# Patient Record
Sex: Female | Born: 1955 | Hispanic: No | Marital: Married | Smoking: Never smoker
Health system: Southern US, Community
[De-identification: ages and names within clinical notes are randomized; demographics above are authoritative.]

## PROBLEM LIST (undated history)

## (undated) DIAGNOSIS — R509 Fever, unspecified: Secondary | ICD-10-CM

## (undated) DIAGNOSIS — I5032 Chronic diastolic (congestive) heart failure: Secondary | ICD-10-CM

## (undated) DIAGNOSIS — I10 Essential (primary) hypertension: Secondary | ICD-10-CM

## (undated) DIAGNOSIS — M2550 Pain in unspecified joint: Secondary | ICD-10-CM

## (undated) DIAGNOSIS — N3001 Acute cystitis with hematuria: Secondary | ICD-10-CM

## (undated) DIAGNOSIS — R55 Syncope and collapse: Secondary | ICD-10-CM

## (undated) DIAGNOSIS — I319 Disease of pericardium, unspecified: Secondary | ICD-10-CM

## (undated) DIAGNOSIS — K625 Hemorrhage of anus and rectum: Secondary | ICD-10-CM

## (undated) DIAGNOSIS — R002 Palpitations: Secondary | ICD-10-CM

## (undated) DIAGNOSIS — F418 Other specified anxiety disorders: Secondary | ICD-10-CM

## (undated) DIAGNOSIS — I471 Supraventricular tachycardia: Secondary | ICD-10-CM

## (undated) DIAGNOSIS — R319 Hematuria, unspecified: Secondary | ICD-10-CM

## (undated) DIAGNOSIS — Z7982 Long term (current) use of aspirin: Secondary | ICD-10-CM

## (undated) HISTORY — PX: OTHER SURGICAL HISTORY: SHX169

## (undated) HISTORY — PX: ABDOMINAL HYSTERECTOMY: SHX81

---
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
## 1999-03-03 ENCOUNTER — Other Ambulatory Visit: Admission: RE | Admit: 1999-03-03 | Discharge: 1999-03-03 | Payer: Self-pay | Admitting: Obstetrics and Gynecology

## 1999-04-06 ENCOUNTER — Other Ambulatory Visit: Admission: RE | Admit: 1999-04-06 | Discharge: 1999-04-06 | Payer: Self-pay | Admitting: Obstetrics and Gynecology

## 1999-10-04 ENCOUNTER — Other Ambulatory Visit: Admission: RE | Admit: 1999-10-04 | Discharge: 1999-10-04 | Payer: Self-pay | Admitting: Obstetrics and Gynecology

## 2000-01-04 ENCOUNTER — Other Ambulatory Visit: Admission: RE | Admit: 2000-01-04 | Discharge: 2000-01-04 | Payer: Self-pay | Admitting: Obstetrics and Gynecology

## 2000-01-04 ENCOUNTER — Other Ambulatory Visit: Admission: RE | Admit: 2000-01-04 | Discharge: 2000-01-04 | Payer: Self-pay | Admitting: Otolaryngology

## 2018-07-20 ENCOUNTER — Other Ambulatory Visit: Payer: Self-pay

## 2018-07-20 ENCOUNTER — Encounter (HOSPITAL_COMMUNITY): Payer: Self-pay

## 2018-07-20 ENCOUNTER — Emergency Department (HOSPITAL_COMMUNITY): Payer: BLUE CROSS/BLUE SHIELD

## 2018-07-20 ENCOUNTER — Observation Stay (HOSPITAL_BASED_OUTPATIENT_CLINIC_OR_DEPARTMENT_OTHER)
Admission: EM | Admit: 2018-07-20 | Discharge: 2018-07-21 | Disposition: A | Discharge status: Home / Self Care | Payer: BLUE CROSS/BLUE SHIELD | Source: Home / Self Care | Attending: Emergency Medicine | Admitting: Emergency Medicine

## 2018-07-20 DIAGNOSIS — R55 Syncope and collapse: Secondary | ICD-10-CM | POA: Diagnosis present

## 2018-07-20 DIAGNOSIS — J9811 Atelectasis: Secondary | ICD-10-CM | POA: Insufficient documentation

## 2018-07-20 DIAGNOSIS — M47814 Spondylosis without myelopathy or radiculopathy, thoracic region: Secondary | ICD-10-CM | POA: Insufficient documentation

## 2018-07-20 DIAGNOSIS — I10 Essential (primary) hypertension: Secondary | ICD-10-CM | POA: Insufficient documentation

## 2018-07-20 DIAGNOSIS — N2889 Other specified disorders of kidney and ureter: Secondary | ICD-10-CM

## 2018-07-20 DIAGNOSIS — I319 Disease of pericardium, unspecified: Secondary | ICD-10-CM | POA: Diagnosis present

## 2018-07-20 DIAGNOSIS — R0789 Other chest pain: Secondary | ICD-10-CM | POA: Diagnosis not present

## 2018-07-20 DIAGNOSIS — R071 Chest pain on breathing: Secondary | ICD-10-CM | POA: Diagnosis not present

## 2018-07-20 DIAGNOSIS — R079 Chest pain, unspecified: Secondary | ICD-10-CM | POA: Insufficient documentation

## 2018-07-20 DIAGNOSIS — M5136 Other intervertebral disc degeneration, lumbar region: Secondary | ICD-10-CM

## 2018-07-20 DIAGNOSIS — M549 Dorsalgia, unspecified: Secondary | ICD-10-CM | POA: Insufficient documentation

## 2018-07-20 DIAGNOSIS — I313 Pericardial effusion (noninflammatory): Secondary | ICD-10-CM

## 2018-07-20 DIAGNOSIS — I300 Acute nonspecific idiopathic pericarditis: Secondary | ICD-10-CM | POA: Diagnosis not present

## 2018-07-20 DIAGNOSIS — E669 Obesity, unspecified: Secondary | ICD-10-CM

## 2018-07-20 DIAGNOSIS — I700 Atherosclerosis of aorta: Secondary | ICD-10-CM | POA: Insufficient documentation

## 2018-07-20 DIAGNOSIS — Z8249 Family history of ischemic heart disease and other diseases of the circulatory system: Secondary | ICD-10-CM | POA: Insufficient documentation

## 2018-07-20 HISTORY — DX: Syncope and collapse: R55

## 2018-07-20 HISTORY — DX: Essential (primary) hypertension: I10

## 2018-07-20 HISTORY — DX: Disease of pericardium, unspecified: I31.9

## 2018-07-20 LAB — URINALYSIS, ROUTINE W REFLEX MICROSCOPIC
Bilirubin Urine: NEGATIVE
Glucose, UA: NEGATIVE mg/dL
Hgb urine dipstick: NEGATIVE
Ketones, ur: NEGATIVE mg/dL
Nitrite: NEGATIVE
Protein, ur: NEGATIVE mg/dL
Specific Gravity, Urine: 1.009 (ref 1.005–1.030)
pH: 6 (ref 5.0–8.0)

## 2018-07-20 LAB — CBC WITH DIFFERENTIAL/PLATELET
Abs Immature Granulocytes: 0.07 10*3/uL (ref 0.00–0.07)
Basophils Absolute: 0 10*3/uL (ref 0.0–0.1)
Basophils Relative: 0 %
Eosinophils Absolute: 0 10*3/uL (ref 0.0–0.5)
Eosinophils Relative: 0 %
HCT: 43.3 % (ref 36.0–46.0)
Hemoglobin: 13.5 g/dL (ref 12.0–15.0)
Immature Granulocytes: 1 %
Lymphocytes Relative: 7 %
Lymphs Abs: 1 10*3/uL (ref 0.7–4.0)
MCH: 30.7 pg (ref 26.0–34.0)
MCHC: 31.2 g/dL (ref 30.0–36.0)
MCV: 98.4 fL (ref 80.0–100.0)
Monocytes Absolute: 1.8 10*3/uL — ABNORMAL HIGH (ref 0.1–1.0)
Monocytes Relative: 13 %
Neutro Abs: 11.1 10*3/uL — ABNORMAL HIGH (ref 1.7–7.7)
Neutrophils Relative %: 79 %
Platelets: 172 10*3/uL (ref 150–400)
RBC: 4.4 MIL/uL (ref 3.87–5.11)
RDW: 11.8 % (ref 11.5–15.5)
WBC: 14 10*3/uL — ABNORMAL HIGH (ref 4.0–10.5)
nRBC: 0 % (ref 0.0–0.2)

## 2018-07-20 LAB — COMPREHENSIVE METABOLIC PANEL
ALT: 16 U/L (ref 0–44)
AST: 18 U/L (ref 15–41)
Albumin: 3.4 g/dL — ABNORMAL LOW (ref 3.5–5.0)
Alkaline Phosphatase: 64 U/L (ref 38–126)
Anion gap: 10 (ref 5–15)
BUN: 12 mg/dL (ref 8–23)
CO2: 25 mmol/L (ref 22–32)
Calcium: 8.8 mg/dL — ABNORMAL LOW (ref 8.9–10.3)
Chloride: 101 mmol/L (ref 98–111)
Creatinine, Ser: 0.78 mg/dL (ref 0.44–1.00)
GFR calc Af Amer: >60 mL/min (ref >60)
GFR calc non Af Amer: >60 mL/min (ref >60)
Glucose, Bld: 128 mg/dL — ABNORMAL HIGH (ref 70–99)
Potassium: 3.5 mmol/L (ref 3.5–5.1)
Sodium: 136 mmol/L (ref 135–145)
Total Bilirubin: 1.2 mg/dL (ref 0.3–1.2)
Total Protein: 6.5 g/dL (ref 6.5–8.1)

## 2018-07-20 LAB — I-STAT TROPONIN, ED
Troponin i, poc: 0 ng/mL (ref 0.00–0.08)
Troponin i, poc: 0.01 ng/mL (ref 0.00–0.08)

## 2018-07-20 LAB — TROPONIN I: Troponin I: <0.03 ng/mL (ref <0.03)

## 2018-07-20 MED ORDER — IOPAMIDOL (ISOVUE-370) INJECTION 76%
100.0000 mL | Freq: Once | INTRAVENOUS | Status: AC | PRN
  Administered 2018-07-20: 100 mL via INTRAVENOUS

## 2018-07-20 MED ORDER — SODIUM CHLORIDE 0.9 % IV BOLUS
500.0000 mL | Freq: Once | INTRAVENOUS | Status: AC
  Administered 2018-07-20: 500 mL via INTRAVENOUS

## 2018-07-20 MED ORDER — ONDANSETRON HCL 4 MG/2ML IJ SOLN: 4.0000 mg | Freq: Four times a day (QID) | INTRAMUSCULAR | Status: DC | PRN

## 2018-07-20 MED ORDER — SODIUM CHLORIDE 0.9 % IV SOLN
INTRAVENOUS | Status: DC
  Administered 2018-07-20 – 2018-07-21 (×2): via INTRAVENOUS

## 2018-07-20 MED ORDER — SODIUM CHLORIDE 0.9 % IV BOLUS
1000.0000 mL | Freq: Once | INTRAVENOUS | Status: AC
  Administered 2018-07-20: 1000 mL via INTRAVENOUS

## 2018-07-20 MED ORDER — ACETAMINOPHEN 325 MG PO TABS
650.0000 mg | ORAL_TABLET | ORAL | Status: DC | PRN
  Administered 2018-07-21 (×2): 650 mg via ORAL
  Filled 2018-07-20 (×2): qty 2

## 2018-07-20 MED ORDER — ENOXAPARIN SODIUM 40 MG/0.4ML ~~LOC~~ SOLN: 40.0000 mg | SUBCUTANEOUS | Status: DC

## 2018-07-20 MED ORDER — IOPAMIDOL (ISOVUE-370) INJECTION 76%
INTRAVENOUS | Status: AC
  Filled 2018-07-20: qty 100

## 2018-07-20 MED ORDER — MORPHINE SULFATE (PF) 4 MG/ML IV SOLN
4.0000 mg | Freq: Once | INTRAVENOUS | Status: AC
  Administered 2018-07-20: 4 mg via INTRAVENOUS
  Filled 2018-07-20: qty 1

## 2018-07-20 MED ORDER — FENTANYL CITRATE (PF) 100 MCG/2ML IJ SOLN
25.0000 ug | INTRAMUSCULAR | Status: DC | PRN
  Administered 2018-07-20: 25 ug via INTRAVENOUS
  Filled 2018-07-20: qty 2

## 2018-07-20 NOTE — Consult Note (Signed)
Cardiology Consultation:   Patient ID: Erica Oneill Bise MRN: [id number]; DOB: 11/20/1955  Admit date: 07/20/2018 Date of Consult: 07/20/2018  Primary Care Provider: Hadley Penobbins, Robert A, MD Primary Cardiologist: None  Patient Profile:   Erica Oneill Gavin is a 63 y.o. female with a hx of HTN who is being seen today for the evaluation of chest discomfort and syncope at the request of [address]  History of Present Illness:   Erica Oneill Diodato is a 63 y.o. female with a hx of HTN who is being seen today for the evaluation of chest discomfort and syncope.  The patient has no cardiac history. She reports that she has been caring for her mother who recently had CABG and thinks that she pulled a muscle in her back. She has subsequently experienced pain that has been persistent, and last night she developed radiation of this pain to her chest. The chest pain was sharp and persistent without aggravating or alleviating factors.   This morning she was sitting at her table and suddenly became lightheaded and diaphoretic very briefly lost consciousness. She had no new chest pain, palpitations, dyspnea or other symptoms before or after this episode. She checked her BP which was 80s/40s. She notes that she has not been eating and drinking enough recently and felt dehydrated at the time.   She presented to the ED this evening. In the ED, SBP initially 110s with HR 80s. ECG showed  NSR with nonspecific ST changes. CTA chest/abdomen/pelvis showed no acute processes. Troponin negative x2. Later in the ED she had an episode of dizziness at which time she became bradycardic and hypotensive.    She was admitted to the hospitalist service and given IVF. Cardiology was consulted for further recommendations.   Past Medical History:  Diagnosis Date  . Hypertension     Past Surgical History:  Procedure Laterality Date  . ABDOMINAL HYSTERECTOMY    . TORN MENISCUS IN LEFT KNEE Left      Home Medications:    Prior to Admission medications   Not on File    Inpatient Medications: Scheduled Meds: . [START ON 07/21/2018] enoxaparin (LOVENOX) injection  40 mg Subcutaneous Q24H   Continuous Infusions: . sodium chloride 999 mL/hr at 07/20/18 2120   PRN Meds: acetaminophen, fentaNYL (SUBLIMAZE) injection, ondansetron (ZOFRAN) IV  Allergies:    Allergies  Allergen Reactions  . Lidocaine Anaphylaxis  . Rocephin [Ceftriaxone] Anaphylaxis  . Other Rash and Hypertension    seafood    Social History:   Social History   Socioeconomic History  . Marital status: Unknown    Spouse name: Not on file  . Number of children: Not on file  . Years of education: Not on file  . Highest education level: Not on file  Occupational History  . Not on file  Social Needs  . Financial resource strain: Not on file  . Food insecurity:    Worry: Not on file    Inability: Not on file  . Transportation needs:    Medical: Not on file    Non-medical: Not on file  Tobacco Use  . Smoking status: Never Smoker  . Smokeless tobacco: Never Used  Substance and Sexual Activity  . Alcohol use: Not Currently  . Drug use: Never  . Sexual activity: Not on file  Lifestyle  . Physical activity:    Days per week: Not on file    Minutes per session: Not on file  . Stress: Not on file  Relationships  .  Social connections:    Talks on phone: Not on file    Gets together: Not on file    Attends religious service: Not on file    Active member of club or organization: Not on file    Attends meetings of clubs or organizations: Not on file    Relationship status: Not on file  . Intimate partner violence:    Fear of current or ex partner: Not on file    Emotionally abused: Not on file    Physically abused: Not on file    Forced sexual activity: Not on file  Other Topics Concern  . Not on file  Social History Narrative  . Not on file    Family History:    Family History  Problem Relation Age of Onset  .  Hypertension Mother   . Heart attack Mother 55  . Cancer Father   . Heart attack Maternal Grandfather 49     ROS:  Please see the history of present illness.  All other ROS reviewed and negative.     Physical Exam/Data:   Vitals:   07/20/18 2015 07/20/18 2030 07/20/18 2045 07/20/18 2152  BP: 100/62 (!) 98/54 (!) 96/58 107/62  Pulse: 87 85 83 81  Resp: (!) 21 17 16 18   Temp:    98.6 F (37 C)  TempSrc:    Oral  SpO2: 95% 93% 95% 94%  Weight:      Height:        Intake/Output Summary (Last 24 hours) at 07/20/2018 2224 Last data filed at 07/20/2018 2010 Gross per 24 hour  Intake 1000 ml  Output -  Net 1000 ml   Last 3 Weights 07/20/2018  Weight (lbs) 149 lb  Weight (kg) 67.586 kg     Body mass index is 24.79 kg/m.  General:  Well nourished, well developed, in no acute distress  HEENT: normal Lymph: no adenopathy Neck: no JVD Cardiac:  normal S1, S2; RRR; no murmur  Lungs:  clear to auscultation bilaterally, no wheezing, rhonchi or rales  Abd: soft, nontender, no hepatomegaly  Ext: no edema Musculoskeletal:  No deformities, BUE and BLE strength normal and equal Skin: warm and dry  Neuro:  No focal abnormalities noted Psych:  Normal affect   EKG:  The EKG was personally reviewed and demonstrates:  NSR with nonspecific ST changes Telemetry:  Telemetry was personally reviewed and demonstrates:  No events   Relevant CV Studies Reports very remote nuclear stress test at [facility] that was normal   Laboratory Data:  Chemistry Recent Labs  Lab 07/20/18 1356  NA 136  K 3.5  CL 101  CO2 25  GLUCOSE 128*  BUN 12  CREATININE 0.78  CALCIUM 8.8*  GFRNONAA >60  GFRAA >60  ANIONGAP 10    Recent Labs  Lab 07/20/18 1356  PROT 6.5  ALBUMIN 3.4*  AST 18  ALT 16  ALKPHOS 64  BILITOT 1.2   Hematology Recent Labs  Lab 07/20/18 1356  WBC 14.0*  RBC 4.40  HGB 13.5  HCT 43.3  MCV 98.4  MCH 30.7  MCHC 31.2  RDW 11.8  PLT 172   Cardiac EnzymesNo results  for input(s): TROPONINI in the last 168 hours.  Recent Labs  Lab 07/20/18 1409 07/20/18 1830  TROPIPOC 0.00 0.01    BNPNo results for input(s): BNP, PROBNP in the last 168 hours.  DDimer No results for input(s): DDIMER in the last 168 hours.  Radiology/Studies:  Ct Angio Chest/abd/pel For Dissection  W And/or Wo Contrast  Result Date: 07/20/2018 CLINICAL DATA:  Upper back pain extending into the chest EXAM: CT ANGIOGRAPHY CHEST, ABDOMEN AND PELVIS TECHNIQUE: Multidetector CT imaging through the chest, abdomen and pelvis was performed using the standard protocol during bolus administration of intravenous contrast. Multiplanar reconstructed images and MIPs were obtained and reviewed to evaluate the vascular anatomy. CONTRAST:  ISOVUE-370 IOPAMIDOL (ISOVUE-370) INJECTION 76% COMPARISON:  Chest radiograph 11/02/2017 FINDINGS: CTA CHEST FINDINGS Cardiovascular: On the noncontrast images there is mild atherosclerotic calcification of the thoracic aorta but no findings of acute intramural hematoma. Following contrast administration we demonstrate no evidence of aortic or branch vessel dissection. Mild cardiomegaly is present and there is a small pericardial effusion. Today's exam was not optimized specifically to assess for pulmonary embolus, but we do not demonstrate any large filling defect in the central pulmonary arteries. Mediastinum/Nodes: Unremarkable Lungs/Pleura: There is dependent atelectasis in both lower lobes. Musculoskeletal: Thoracic spondylosis. Review of the MIP images confirms the above findings. CTA ABDOMEN AND PELVIS FINDINGS VASCULAR Aorta: No significant atherosclerotic calcification of the abdominal aorta. No aneurysm or dissection. Celiac: Patent celiac trunk giving rise to the common hepatic and ultimately the proper hepatic artery. No splenic artery aneurysm. SMA: Patent and unremarkable. Renals: 2 left and single right renal arteries appear patent and unremarkable. IMA: Patent  and unremarkable. Inflow: Unremarkable Veins: Incidental retroaortic left renal vein. Review of the MIP images confirms the above findings. NON-VASCULAR Hepatobiliary: Arterial phase appearance of the liver is unremarkable. The gallbladder is mildly prominent but without peritubal wall thickening. Pancreas: Unremarkable Spleen: Typical striated arterial phase enhancement pattern without specific splenic lesion identified. Adrenals/Urinary Tract: 10 mm hypodense lesion of the right kidney upper pole is likely a cyst but technically nonspecific due to small size the adrenal glands appear normal. Urinary bladder normal. Stomach/Bowel: Unremarkable Lymphatic: No lymphadenopathy identified. Reproductive: Uterus absent.  Adnexa unremarkable. Other: No supplemental non-categorized findings. Musculoskeletal: Mild grade 1 degenerative retrolisthesis at L3-4. Degenerative facet arthropathy at L5-S1. Review of the MIP images confirms the above findings. IMPRESSION: 1. No acute vascular findings. There is only minimal atherosclerotic calcification of the aortic arch. No dissection observed. 2. Mild cardiomegaly.  Trace pericardial effusion. 3. Mild dependent atelectasis in both lower lobes. 4. 10 mm hypodense lesion of the right kidney upper pole is likely a cyst but technically nonspecific. Electronically Signed   By: Gaylyn Rong M.D.   On: 07/20/2018 17:17    Assessment and Plan:   Chest pain The patient has no know cardiac history. She has experienced persistent back pain due to presumed muscle strain. This pain subsequently radiated to her chest. ECG shows no acute changes and troponin is negative x2. Her symptoms are not consistent with angina.  -Continue to trend troponin and monitor symptoms -Recommend orthostatic vital signs  -Continue IVF -Can consider stress testing, likely as outpatient   Syncopal episode The patient experienced a first time syncopal episode at home today preceded by dizziness and  diaphoresis but no other associated cardiac symptoms; this occurred in the setting of decreased oral intake. She then had a similar episode in the ED tonight and was noted to be hypotensive and bradycardic at the time. Her presentation suggests neurocardiogenic etiology, likely with element of hypovolemia. She feels symptomatically improved with IVF. ECG shows no signs of conduction disease. Arrhythmogenic syncope is therefore unlikely.  -Continue telemetry -Continue to monitor symptoms -Baseline echocardiogram reasonable      For questions or updates, please contact [facility]  Please consult [email or web address] for contact info under     Signed, Ernest Mallickaylor Kishan Wachsmuth, MD  07/20/2018 10:24 PM

## 2018-07-20 NOTE — H&P (Signed)
History and Physical    Erica Oneill DOB: 1956/04/08 DOA: 07/20/2018  PCP: Hadley Pen, MD  Patient coming from: Home  I have personally briefly reviewed patient's old medical records in [facility]  Chief Complaint: CP  HPI: Erica Oneill is a 63 y.o. female with medical history significant of HTN, obesity.  Patient has been on diet pills recently and lost significant weight.  Patient presents to ED with c/o CP and back pain.  Onset 730pm yesterday.  This morning PTA had episode of syncope.  BP at that time 80/40.  EMS transported her here to ED.   ED Course: Had episode of bradycardia and hypotension while in ED, brady into the 50s.  CTA chest, abd, pelvis neg for dissection or PE.  Trop neg x2.   Review of Systems: As per HPI otherwise 10 point review of systems negative.   Past Medical History:  Diagnosis Date  . Hypertension     Past Surgical History:  Procedure Laterality Date  . ABDOMINAL HYSTERECTOMY    . TORN MENISCUS IN LEFT KNEE Left      reports that she has never smoked. She has never used smokeless tobacco. She reports previous alcohol use. She reports that she does not use drugs.  Allergies  Allergen Reactions  . Lidocaine Anaphylaxis  . Rocephin [Ceftriaxone] Anaphylaxis  . Other Rash and Hypertension    seafood    Family History  Problem Relation Age of Onset  . Hypertension Mother   . Cancer Father      Prior to Admission medications   Not on File    Physical Exam: Vitals:   07/20/18 1845 07/20/18 1857 07/20/18 1900 07/20/18 1908  BP: (!) 102/58 (!) 102/58 (!) 99/52 (!) 102/58  Pulse: 80 80 81 84  Resp: (!) 23 (!) 25 (!) 23 19  Temp:      TempSrc:      SpO2: 96% 97% 96% 96%  Weight:      Height:        Constitutional: NAD, calm, comfortable Eyes: PERRL, lids and conjunctivae normal ENMT: Mucous membranes are moist. Posterior pharynx clear of any exudate or lesions.Normal dentition.  Neck: normal,  supple, no masses, no thyromegaly Respiratory: clear to auscultation bilaterally, no wheezing, no crackles. Normal respiratory effort. No accessory muscle use.  Cardiovascular: Regular rate and rhythm, no murmurs / rubs / gallops. No extremity edema. 2+ pedal pulses. No carotid bruits.  Abdomen: no tenderness, no masses palpated. No hepatosplenomegaly. Bowel sounds positive.  Musculoskeletal: no clubbing / cyanosis. No joint deformity upper and lower extremities. Good ROM, no contractures. Normal muscle tone.  Skin: no rashes, lesions, ulcers. No induration Neurologic: CN 2-12 grossly intact. Sensation intact, DTR normal. Strength 5/5 in all 4.  Psychiatric: Normal judgment and insight. Alert and oriented x 3. Normal mood.    Labs on Admission: I have personally reviewed following labs and imaging studies  CBC: Recent Labs  Lab 07/20/18 1356  WBC 14.0*  NEUTROABS 11.1*  HGB 13.5  HCT 43.3  MCV 98.4  PLT 172   Basic Metabolic Panel: Recent Labs  Lab 07/20/18 1356  NA 136  K 3.5  CL 101  CO2 25  GLUCOSE 128*  BUN 12  CREATININE 0.78  CALCIUM 8.8*   GFR: Estimated Creatinine Clearance: 63.9 mL/min (by C-G formula based on SCr of 0.78 mg/dL). Liver Function Tests: Recent Labs  Lab 07/20/18 1356  AST 18  ALT 16  ALKPHOS 64  BILITOT 1.2  PROT 6.5  ALBUMIN 3.4*   No results for input(s): LIPASE, AMYLASE in the last 168 hours. No results for input(s): AMMONIA in the last 168 hours. Coagulation Profile: No results for input(s): INR, PROTIME in the last 168 hours. Cardiac Enzymes: No results for input(s): CKTOTAL, CKMB, CKMBINDEX, TROPONINI in the last 168 hours. BNP (last 3 results) No results for input(s): PROBNP in the last 8760 hours. HbA1C: No results for input(s): HGBA1C in the last 72 hours. CBG: No results for input(s): GLUCAP in the last 168 hours. Lipid Profile: No results for input(s): CHOL, HDL, LDLCALC, TRIG, CHOLHDL, LDLDIRECT in the last 72  hours. Thyroid Function Tests: No results for input(s): TSH, T4TOTAL, FREET4, T3FREE, THYROIDAB in the last 72 hours. Anemia Panel: No results for input(s): VITAMINB12, FOLATE, FERRITIN, TIBC, IRON, RETICCTPCT in the last 72 hours. Urine analysis:    Component Value Date/Time   COLORURINE YELLOW 07/20/2018 1441   APPEARANCEUR HAZY (A) 07/20/2018 1441   LABSPEC 1.009 07/20/2018 1441   PHURINE 6.0 07/20/2018 1441   GLUCOSEU NEGATIVE 07/20/2018 1441   HGBUR NEGATIVE 07/20/2018 1441   BILIRUBINUR NEGATIVE 07/20/2018 1441   KETONESUR NEGATIVE 07/20/2018 1441   PROTEINUR NEGATIVE 07/20/2018 1441   NITRITE NEGATIVE 07/20/2018 1441   LEUKOCYTESUR SMALL (A) 07/20/2018 1441    Radiological Exams on Admission: Ct Angio Chest/abd/pel For Dissection W And/or Wo Contrast  Result Date: 07/20/2018 CLINICAL DATA:  Upper back pain extending into the chest EXAM: CT ANGIOGRAPHY CHEST, ABDOMEN AND PELVIS TECHNIQUE: Multidetector CT imaging through the chest, abdomen and pelvis was performed using the standard protocol during bolus administration of intravenous contrast. Multiplanar reconstructed images and MIPs were obtained and reviewed to evaluate the vascular anatomy. CONTRAST:  100mL ISOVUE-370 IOPAMIDOL (ISOVUE-370) INJECTION 76% COMPARISON:  Chest radiograph 11/02/2017 FINDINGS: CTA CHEST FINDINGS Cardiovascular: On the noncontrast images there is mild atherosclerotic calcification of the thoracic aorta but no findings of acute intramural hematoma. Following contrast administration we demonstrate no evidence of aortic or branch vessel dissection. Mild cardiomegaly is present and there is a small pericardial effusion. Today's exam was not optimized specifically to assess for pulmonary embolus, but we do not demonstrate any large filling defect in the central pulmonary arteries. Mediastinum/Nodes: Unremarkable Lungs/Pleura: There is dependent atelectasis in both lower lobes. Musculoskeletal: Thoracic  spondylosis. Review of the MIP images confirms the above findings. CTA ABDOMEN AND PELVIS FINDINGS VASCULAR Aorta: No significant atherosclerotic calcification of the abdominal aorta. No aneurysm or dissection. Celiac: Patent celiac trunk giving rise to the common hepatic and ultimately the proper hepatic artery. No splenic artery aneurysm. SMA: Patent and unremarkable. Renals: 2 left and single right renal arteries appear patent and unremarkable. IMA: Patent and unremarkable. Inflow: Unremarkable Veins: Incidental retroaortic left renal vein. Review of the MIP images confirms the above findings. NON-VASCULAR Hepatobiliary: Arterial phase appearance of the liver is unremarkable. The gallbladder is mildly prominent but without peritubal wall thickening. Pancreas: Unremarkable Spleen: Typical striated arterial phase enhancement pattern without specific splenic lesion identified. Adrenals/Urinary Tract: 10 mm hypodense lesion of the right kidney upper pole is likely a cyst but technically nonspecific due to small size the adrenal glands appear normal. Urinary bladder normal. Stomach/Bowel: Unremarkable Lymphatic: No lymphadenopathy identified. Reproductive: Uterus absent.  Adnexa unremarkable. Other: No supplemental non-categorized findings. Musculoskeletal: Mild grade 1 degenerative retrolisthesis at L3-4. Degenerative facet arthropathy at L5-S1. Review of the MIP images confirms the above findings. IMPRESSION: 1. No acute vascular findings. There is only minimal atherosclerotic calcification of  the aortic arch. No dissection observed. 2. Mild cardiomegaly.  Trace pericardial effusion. 3. Mild dependent atelectasis in both lower lobes. 4. 10 mm hypodense lesion of the right kidney upper pole is likely a cyst but technically nonspecific. Electronically Signed   By: Gaylyn Rong M.D.   On: 07/20/2018 17:17    EKG: Independently reviewed.  Assessment/Plan Principal Problem:   Chest pain, rule out acute  myocardial infarction Active Problems:   Syncope    1. CP r/o - 1. CP obs pathway 2. Serial trops 3. Tele monitor 4. NPO 5. Cards eval in AM 6. Stopping patients beta blocker 2. Syncope 1. 2d echo 2. IVF: 1L in ED and 125 cc/hr NS  DVT prophylaxis: Lovenox Code Status: Full Family Communication: Family at bedside Disposition Plan: Home after admit Consults called: EDP spoke with cards, sending message to [facility] [place] Admission status: Place in [place], [place]. DO [facility] Pager [phone] Only works nights!  If 7AM-7PM, please contact the primary day team physician taking care of patient  [email or web address] Password [facility]  07/20/2018, 8:50 PM

## 2018-07-20 NOTE — ED Triage Notes (Signed)
Pt arrives by [facility] c/o chest pain and back pain with family arriving in POV behind EMS. 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 Pt states back pain feels like "muscle spasms" between her shoulder blades. Pt states when "she takes a deep breath she has substernal chest pain." Pt has extensive family hx of cardiac conditions. Pt states cp started first, then the back pain. Pt had CP this morning and felt "dizzy/lightheaded" and hit the side of her head on the kitchen table. Pt denies any head pain. BP taken by EMS upon arrival to pt's home was 150/82. En route BP was 80/40. Was given 324 mg aspirin and 500 ml normal saline bolus by EMS. Upon arrival to hospital pt's BP was 116/72 with EMS. Pt states CP feels "sore and tight". Pt states she has hypertension that is controlled at home and take bystolic daily. VSS at this time.  Last vitals taken by EMS at 12:50 pm:  BP 116/72 HR 88 O2 97% on room air BG 111

## 2018-07-20 NOTE — ED Provider Notes (Signed)
[facility] EMERGENCY DEPARTMENT Provider Note   CSN: [id number] Arrival date & time: 07/20/18  1251     History   Chief Complaint Chief Complaint  Patient presents with  . Chest Pain  . Back Pain    HPI Erica Oneill is a 63 y.o. female with a PMHx of HTN, who presents to the ED with complaints of upper back pain radiating to her chest that began around 7:30 PM yesterday while she was sitting at rest.  Patient states that she has been helping her mother recover from CABG surgery over the last week, and has been having lower torso muscle spasms which have been improving with Zanaflex.  Last night around 7:30 PM she started having what felt like a muscle spasm in the upper back, but it radiated into her chest.  She describes this pain as 10/10 constant stabbing upper back pain that radiates into the center of her chest, worse with inspiration, sitting, and laying, and unrelieved with heat, aspirin, Zanaflex, and ibuprofen.  This morning just prior to arrival she had an episode where she felt lightheaded, got diaphoretic, had her head down on a table while sitting at a bench and had a brief episode of loss of consciousness.  Patient's daughter states that she bumped her head on the end of the table while she was going down onto the bench, her blood pressure at that time was 80/40, but after they laid her down and put her feet up it improved.  Patient is not worried about her head, denies any headache or vision changes, denies having a fall during this episode.  Was gently laid down on the bench, and states that the head injury was very minor.  At that time EMS arrived and transported her here.  She was given a 500 mL bolus but her blood pressure has remained stable during transport even before and after the bolus.  She was also given 4 aspirin.  She mentions that she has been on diet pills and has lost some weight recently, and that she has not been eating well because she is  been caring for her mother.  Her PCP is Keturah Barre in [place].  She does not have a cardiologist.  She is a non-smoker.  She has a extensive family history of cardiac disease including MI in her mother at 8 years old, maternal grandfather at 97, and her other grandparent as well.  She denies any recent falls, headache, vision changes, fevers, chills, cough, shortness of breath, leg swelling, recent travel/surgery/immobilization, estrogen use, personal history of DVT or PE, abdominal pain, nausea, vomiting, diarrhea, constipation, dysuria, hematuria, numbness, tingling, focal weakness, claudication, orthopnea, or any other complaints at this time.  The history is provided by the patient and medical records. No language interpreter was used.  Chest Pain  Associated symptoms: back pain and diaphoresis   Associated symptoms: no abdominal pain, no cough, no fever, no headache, no nausea, no numbness, no shortness of breath, no vomiting and no weakness   Back Pain  Associated symptoms: chest pain   Associated symptoms: no abdominal pain, no dysuria, no fever, no headaches, no numbness and no weakness     Past Medical History:  Diagnosis Date  . Hypertension     There are no active problems to display for this patient.   Past Surgical History:  Procedure Laterality Date  . ABDOMINAL HYSTERECTOMY    . TORN MENISCUS IN LEFT KNEE Left  OB History   No obstetric history on file.      Home Medications    Prior to Admission medications   Not on File    Family History Family History  Problem Relation Age of Onset  . Hypertension Mother   . Cancer Father     Social History Social History   Tobacco Use  . Smoking status: Never Smoker  . Smokeless tobacco: Never Used  Substance Use Topics  . Alcohol use: Not Currently  . Drug use: Never     Allergies   Lidocaine and Rocephin [ceftriaxone]   Review of Systems Review of Systems  Constitutional: Positive for  diaphoresis. Negative for chills and fever.  Eyes: Negative for visual disturbance.  Respiratory: Negative for cough and shortness of breath.   Cardiovascular: Positive for chest pain. Negative for leg swelling.  Gastrointestinal: Negative for abdominal pain, constipation, diarrhea, nausea and vomiting.  Genitourinary: Negative for dysuria and hematuria.  Musculoskeletal: Positive for back pain. Negative for arthralgias and myalgias.  Skin: Negative for color change.  Allergic/Immunologic: Negative for immunocompromised state.  Neurological: Positive for syncope and light-headedness. Negative for weakness, numbness and headaches.  Psychiatric/Behavioral: Negative for confusion.   All other systems reviewed and are negative for acute change except as noted in the HPI.    Physical Exam Updated Vital Signs BP 114/60   Pulse 89   Temp 98.1 F (36.7 C) (Oral)   Resp 20   Ht 5\' 5"  (1.651 m)   Wt 67.6 kg   SpO2 98%   BMI 24.79 kg/m    Physical Exam Vitals signs and nursing note reviewed.  Constitutional:      General: She is not in acute distress.    Appearance: Normal appearance. She is well-developed. She is not toxic-appearing.     Comments: Afebrile, nontoxic, NAD  HENT:     Head: Normocephalic and atraumatic.     Comments: [place]/AT Eyes:     General:        Right eye: No discharge.        Left eye: No discharge.     Conjunctiva/sclera: Conjunctivae normal.  Neck:     Musculoskeletal: Normal range of motion and neck supple.  Cardiovascular:     Rate and Rhythm: Normal rate and regular rhythm.     Pulses: Normal pulses.     Heart sounds: Normal heart sounds, S1 normal and S2 normal. No murmur. No friction rub. No gallop.      Comments: RRR, nl s1/s2, no m/r/g, distal pulses intact, no pedal edema  Pulmonary:     Effort: Pulmonary effort is normal. No respiratory distress.     Breath sounds: Normal breath sounds. No decreased breath sounds, wheezing, rhonchi or rales.      Comments: CTAB in all lung fields, no w/r/r, no hypoxia or increased WOB, speaking in full sentences, SpO2 98% on RA  Chest:     Chest wall: Tenderness present. No deformity or crepitus.       Comments: Chest wall with mild R sided anterior chest wall TTP, without crepitus, deformities, or retractions  Abdominal:     General: Bowel sounds are normal. There is no distension.     Palpations: Abdomen is soft. Abdomen is not rigid.     Tenderness: There is no abdominal tenderness. There is no right CVA tenderness, left CVA tenderness, guarding or rebound. Negative signs include Murphy's sign and McBurney's sign.  Musculoskeletal: Normal range of motion.     Thoracic  back: She exhibits tenderness and spasm. She exhibits no bony tenderness and no deformity.       Back:     Comments: Mild R thoracic paraspinous muscle TTP next to the scapula, with slight spasm felt in the musculature. FROM intact in the shoulder and spine, no midline spinal TTP, no bony stepoffs or deformities. MAE x4 Strength and sensation grossly intact in all extremities Distal pulses intact No pedal edema, neg homan's bilaterally    Skin:    General: Skin is warm and dry.     Findings: No rash.  Neurological:     Mental Status: She is alert and oriented to person, place, and time.     GCS: GCS eye subscore is 4. GCS verbal subscore is 5. GCS motor subscore is 6.     Cranial Nerves: Cranial nerves are intact.     Sensory: Sensation is intact. No sensory deficit.     Motor: Motor function is intact.     Comments: No focal neuro deficits  Psychiatric:        Mood and Affect: Mood and affect normal.        Behavior: Behavior normal.      ED Treatments / Results  Labs (all labs ordered are listed, but only abnormal results are displayed) Labs Reviewed  CBC WITH DIFFERENTIAL/PLATELET - Abnormal; Notable for the following components:      Result Value   WBC 14.0 (*)    Neutro Abs 11.1 (*)    Monocytes Absolute 1.8  (*)    All other components within normal limits  COMPREHENSIVE METABOLIC PANEL - Abnormal; Notable for the following components:   Glucose, Bld 128 (*)    Calcium 8.8 (*)    Albumin 3.4 (*)    All other components within normal limits  URINALYSIS, ROUTINE W REFLEX MICROSCOPIC - Abnormal; Notable for the following components:   APPearance HAZY (*)    Leukocytes, UA SMALL (*)    Bacteria, UA RARE (*)    Non Squamous Epithelial 0-5 (*)    All other components within normal limits  I-STAT TROPONIN, ED  I-STAT TROPONIN, ED  I-STAT TROPONIN, ED    EKG EKG Interpretation  Date/Time:  Saturday July 20 2018 13:14:15 EST Ventricular Rate:  89 PR Interval:    QRS Duration: 96 QT Interval:  343 QTC Calculation: 418 R Axis:   24 Text Interpretation:  Sinus rhythm PR depression inferior and lateral leads No previous ECGs available   Confirmed by Alvira MondaySchlossman, Erin ([id number]) on 07/20/2018 2:50:22 PM   Radiology Ct Angio Chest/abd/pel For Dissection W And/or Wo Contrast  Result Date: 07/20/2018 CLINICAL DATA:  Upper back pain extending into the chest EXAM: CT ANGIOGRAPHY CHEST, ABDOMEN AND PELVIS TECHNIQUE: Multidetector CT imaging through the chest, abdomen and pelvis was performed using the standard protocol during bolus administration of intravenous contrast. Multiplanar reconstructed images and MIPs were obtained and reviewed to evaluate the vascular anatomy. CONTRAST:  100mL ISOVUE-370 IOPAMIDOL (ISOVUE-370) INJECTION 76% COMPARISON:  Chest radiograph 11/02/2017 FINDINGS: CTA CHEST FINDINGS Cardiovascular: On the noncontrast images there is mild atherosclerotic calcification of the thoracic aorta but no findings of acute intramural hematoma. Following contrast administration we demonstrate no evidence of aortic or branch vessel dissection. Mild cardiomegaly is present and there is a small pericardial effusion. Today's exam was not optimized specifically to assess for pulmonary embolus, but we  do not demonstrate any large filling defect in the central pulmonary arteries. Mediastinum/Nodes: Unremarkable Lungs/Pleura: There is dependent atelectasis  in both lower lobes. Musculoskeletal: Thoracic spondylosis. Review of the MIP images confirms the above findings. CTA ABDOMEN AND PELVIS FINDINGS VASCULAR Aorta: No significant atherosclerotic calcification of the abdominal aorta. No aneurysm or dissection. Celiac: Patent celiac trunk giving rise to the common hepatic and ultimately the proper hepatic artery. No splenic artery aneurysm. SMA: Patent and unremarkable. Renals: 2 left and single right renal arteries appear patent and unremarkable. IMA: Patent and unremarkable. Inflow: Unremarkable Veins: Incidental retroaortic left renal vein. Review of the MIP images confirms the above findings. NON-VASCULAR Hepatobiliary: Arterial phase appearance of the liver is unremarkable. The gallbladder is mildly prominent but without peritubal wall thickening. Pancreas: Unremarkable Spleen: Typical striated arterial phase enhancement pattern without specific splenic lesion identified. Adrenals/Urinary Tract: 10 mm hypodense lesion of the right kidney upper pole is likely a cyst but technically nonspecific due to small size the adrenal glands appear normal. Urinary bladder normal. Stomach/Bowel: Unremarkable Lymphatic: No lymphadenopathy identified. Reproductive: Uterus absent.  Adnexa unremarkable. Other: No supplemental non-categorized findings. Musculoskeletal: Mild grade 1 degenerative retrolisthesis at L3-4. Degenerative facet arthropathy at L5-S1. Review of the MIP images confirms the above findings. IMPRESSION: 1. No acute vascular findings. There is only minimal atherosclerotic calcification of the aortic arch. No dissection observed. 2. Mild cardiomegaly.  Trace pericardial effusion. 3. Mild dependent atelectasis in both lower lobes. 4. 10 mm hypodense lesion of the right kidney upper pole is likely a cyst but  technically nonspecific. Electronically Signed   By: Gaylyn RongWalter  Liebkemann M.D.   On: 07/20/2018 17:17    Procedures Procedures (including critical care time)  Medications Ordered in ED Medications  iopamidol (ISOVUE-370) 76 % injection (has no administration in time range)  sodium chloride 0.9 % bolus 1,000 mL (1,000 mLs Intravenous New Bag/Given 07/20/18 1824)  morphine 4 MG/ML injection 4 mg (4 mg Intravenous Given 07/20/18 1543)  iopamidol (ISOVUE-370) 76 % injection 100 mL (100 mLs Intravenous Contrast Given 07/20/18 1613)  sodium chloride 0.9 % bolus 1,000 mL (0 mLs Intravenous Stopped 07/20/18 1825)     Initial Impression / Assessment and Plan / ED Course  I have reviewed the triage vital signs and the nursing notes.  Pertinent labs & imaging results that were available during my care of the patient were reviewed by me and considered in my medical decision making (see chart for details).     63 y.o. female here with upper back pain that radiates to chest worse with inspiration that began last night. States she's had some muscle spasms in her torso for about a week, has been caring for her mother who had a CABG recently; this morning had episode of diaphoresis, lightheadedness, and brief LOC, BP at that time 80/40. Laid down and put feet up and BP improved. On exam, mild tenderness to R thoracic paraspinous muscle with very mild spasm, mild tenderness to R anterior chest wall, clear lung exam, no tachycardia or hypoxia, no increased WOB, no pedal edema, neg homan's bilaterally. No focal neuro deficits. EKG with PR depression inferiorly and laterally. Awaiting labs, DDx includes PE, dissection, etc. Will get CTA chest/abd/pelv to r/o dissection vs PE, etc. Doubt need for head imaging. Pt already received ASA, want to hold off on NTG due to BP, pt declines wanting anything for pain at this time. Will reassess shortly. Will likely need admission for ACS r/o. Discussed case with my attending Dr.  Dalene SeltzerSchlossman who agrees with plan.  4:46 PM CBC w/diff with mildly elevated WBC 14.0. CMP with mildly elevated  gluc 128 but otherwise fairly unremarkable. Trop neg.  U/A with no nitrites, small leuks, 6-10 squamous and WBC, rare bacteria, doesn't seem c/w UTI, looks contaminated. Doubt UCx would be helpful. BPs soft, could be from morphine since it started after morphing was given, but will give fluids. Awaiting CTA results will reassess shortly.   5:44 PM CTA Chest/abd/pelv with trace pericardial effusion and mild cardiomegaly, b/l dependent atelectasis in lower fields, no dissection or PE. BPs still soft but fluids running, and pt mentating well and appears stable. Given her extensive family cardiac history, and CP with syncope, will proceed with admission.   6:12 PM Nursing staff coming in to tell me her BP dropped lower, suddenly became bradycardic, lightheaded, pale, and diaphoretic. She spent several minutes bradycardic and hypotensive. Repeat EKG unchanged and without acute ischemic findings. She was laid down and color improved, symptoms improved. Called cardiology, spoke with Dr. Allena Earing, feels this was vasovagal, states repeat troponin and if negative then admit to hospitalist and if they need them then formally consult. If positive, call him back. Will await second troponin.   7:09 PM Second trop negative. BP improving with fluids. Will proceed with admission.   7:53 PM Dr. Julian Reil of [facility] returning page and will admit. Holding orders to be placed by admitting team. Please see their notes for further documentation of care. I appreciate their help with this pleasant pt's care. Pt stable at time of admission.    Final Clinical Impressions(s) / ED Diagnoses   Final diagnoses:  Chest pain on breathing  Upper back pain  Syncope, unspecified syncope type    ED Discharge Orders    [address], [place], [place] 07/20/18 1953    Alvira Monday, MD 07/23/18 2201

## 2018-07-20 NOTE — ED Notes (Signed)
Pt hypotensive and bradycardic at this time. Pt reports that she feels "like she is going to pass out." Pt is visibly pale and diaphoretic. ED provider and RN at the bedside.

## 2018-07-21 ENCOUNTER — Emergency Department (HOSPITAL_COMMUNITY): Payer: BLUE CROSS/BLUE SHIELD

## 2018-07-21 ENCOUNTER — Observation Stay (HOSPITAL_COMMUNITY): Payer: BLUE CROSS/BLUE SHIELD

## 2018-07-21 ENCOUNTER — Inpatient Hospital Stay (HOSPITAL_COMMUNITY)
Admission: EM | Admit: 2018-07-21 | Discharge: 2018-07-23 | DRG: 314 | Disposition: A | Discharge status: Home / Self Care | Payer: BLUE CROSS/BLUE SHIELD | Attending: Internal Medicine | Admitting: Internal Medicine

## 2018-07-21 ENCOUNTER — Encounter (HOSPITAL_COMMUNITY): Payer: Self-pay

## 2018-07-21 DIAGNOSIS — I10 Essential (primary) hypertension: Secondary | ICD-10-CM | POA: Diagnosis present

## 2018-07-21 DIAGNOSIS — Z90710 Acquired absence of both cervix and uterus: Secondary | ICD-10-CM

## 2018-07-21 DIAGNOSIS — I319 Disease of pericardium, unspecified: Secondary | ICD-10-CM | POA: Diagnosis present

## 2018-07-21 DIAGNOSIS — R509 Fever, unspecified: Secondary | ICD-10-CM | POA: Diagnosis present

## 2018-07-21 DIAGNOSIS — M546 Pain in thoracic spine: Secondary | ICD-10-CM | POA: Diagnosis present

## 2018-07-21 DIAGNOSIS — R079 Chest pain, unspecified: Secondary | ICD-10-CM

## 2018-07-21 DIAGNOSIS — M549 Dorsalgia, unspecified: Secondary | ICD-10-CM | POA: Diagnosis present

## 2018-07-21 DIAGNOSIS — Z8249 Family history of ischemic heart disease and other diseases of the circulatory system: Secondary | ICD-10-CM

## 2018-07-21 DIAGNOSIS — Z79899 Other long term (current) drug therapy: Secondary | ICD-10-CM

## 2018-07-21 DIAGNOSIS — I309 Acute pericarditis, unspecified: Secondary | ICD-10-CM | POA: Diagnosis not present

## 2018-07-21 DIAGNOSIS — I300 Acute nonspecific idiopathic pericarditis: Principal | ICD-10-CM | POA: Diagnosis present

## 2018-07-21 DIAGNOSIS — Z881 Allergy status to other antibiotic agents status: Secondary | ICD-10-CM

## 2018-07-21 DIAGNOSIS — Z884 Allergy status to anesthetic agent status: Secondary | ICD-10-CM

## 2018-07-21 DIAGNOSIS — I401 Isolated myocarditis: Secondary | ICD-10-CM | POA: Diagnosis present

## 2018-07-21 LAB — CBC WITH DIFFERENTIAL/PLATELET
Abs Immature Granulocytes: 0.13 10*3/uL — ABNORMAL HIGH (ref 0.00–0.07)
Basophils Absolute: 0.1 10*3/uL (ref 0.0–0.1)
Basophils Relative: 0 %
Eosinophils Absolute: 0.1 10*3/uL (ref 0.0–0.5)
Eosinophils Relative: 0 %
HCT: 41.5 % (ref 36.0–46.0)
Hemoglobin: 12.8 g/dL (ref 12.0–15.0)
Immature Granulocytes: 1 %
Lymphocytes Relative: 4 %
Lymphs Abs: 0.9 10*3/uL (ref 0.7–4.0)
MCH: 31.1 pg (ref 26.0–34.0)
MCHC: 30.8 g/dL (ref 30.0–36.0)
MCV: 100.7 fL — ABNORMAL HIGH (ref 80.0–100.0)
Monocytes Absolute: 2.2 10*3/uL — ABNORMAL HIGH (ref 0.1–1.0)
Monocytes Relative: 11 %
Neutro Abs: 17 10*3/uL — ABNORMAL HIGH (ref 1.7–7.7)
Neutrophils Relative %: 84 %
Platelets: 132 10*3/uL — ABNORMAL LOW (ref 150–400)
RBC: 4.12 MIL/uL (ref 3.87–5.11)
RDW: 12.2 % (ref 11.5–15.5)
WBC: 20.3 10*3/uL — ABNORMAL HIGH (ref 4.0–10.5)
nRBC: 0 % (ref 0.0–0.2)

## 2018-07-21 LAB — COMPREHENSIVE METABOLIC PANEL
ALT: 11 U/L (ref 0–44)
AST: 22 U/L (ref 15–41)
Albumin: 2.8 g/dL — ABNORMAL LOW (ref 3.5–5.0)
Alkaline Phosphatase: 60 U/L (ref 38–126)
Anion gap: 10 (ref 5–15)
BUN: 9 mg/dL (ref 8–23)
CO2: 18 mmol/L — ABNORMAL LOW (ref 22–32)
Calcium: 8.5 mg/dL — ABNORMAL LOW (ref 8.9–10.3)
Chloride: 106 mmol/L (ref 98–111)
Creatinine, Ser: 0.78 mg/dL (ref 0.44–1.00)
GFR calc Af Amer: >60 mL/min (ref >60)
GFR calc non Af Amer: >60 mL/min (ref >60)
Glucose, Bld: 132 mg/dL — ABNORMAL HIGH (ref 70–99)
Potassium: 4.4 mmol/L (ref 3.5–5.1)
Sodium: 134 mmol/L — ABNORMAL LOW (ref 135–145)
Total Bilirubin: 2 mg/dL — ABNORMAL HIGH (ref 0.3–1.2)
Total Protein: 6.2 g/dL — ABNORMAL LOW (ref 6.5–8.1)

## 2018-07-21 LAB — TROPONIN I
Troponin I: <0.03 ng/mL (ref <0.03)
Troponin I: 0.08 ng/mL (ref <0.03)
Troponin I: 0.05 ng/mL (ref <0.03)

## 2018-07-21 LAB — HIV ANTIBODY (ROUTINE TESTING W REFLEX): HIV Screen 4th Generation wRfx: NONREACTIVE

## 2018-07-21 LAB — C-REACTIVE PROTEIN: CRP: 22.7 mg/dL — ABNORMAL HIGH (ref <1.0)

## 2018-07-21 MED ORDER — ONDANSETRON HCL 4 MG/2ML IJ SOLN: 4.0000 mg | Freq: Four times a day (QID) | INTRAMUSCULAR | Status: DC | PRN

## 2018-07-21 MED ORDER — SODIUM CHLORIDE 0.9% FLUSH
3.0000 mL | Freq: Two times a day (BID) | INTRAVENOUS | Status: DC
  Administered 2018-07-22 (×3): 3 mL via INTRAVENOUS

## 2018-07-21 MED ORDER — ACETAMINOPHEN 325 MG PO TABS
650.0000 mg | ORAL_TABLET | Freq: Four times a day (QID) | ORAL | Status: DC | PRN
  Administered 2018-07-22: 650 mg via ORAL
  Filled 2018-07-21: qty 2

## 2018-07-21 MED ORDER — ONDANSETRON HCL 4 MG PO TABS: 4.0000 mg | ORAL_TABLET | Freq: Four times a day (QID) | ORAL | Status: DC | PRN

## 2018-07-21 MED ORDER — KETOROLAC TROMETHAMINE 10 MG PO TABS: 10.0000 mg | ORAL_TABLET | Freq: Four times a day (QID) | ORAL | 0 refills | Status: DC | PRN

## 2018-07-21 MED ORDER — ACETAMINOPHEN 325 MG PO TABS
650.0000 mg | ORAL_TABLET | Freq: Once | ORAL | Status: AC
  Administered 2018-07-21: 650 mg via ORAL
  Filled 2018-07-21: qty 2

## 2018-07-21 MED ORDER — COLCHICINE 0.6 MG PO TABS
1.2000 mg | ORAL_TABLET | Freq: Two times a day (BID) | ORAL | Status: DC
  Filled 2018-07-21 (×2): qty 2

## 2018-07-21 MED ORDER — KETOROLAC TROMETHAMINE 15 MG/ML IJ SOLN
15.0000 mg | Freq: Once | INTRAMUSCULAR | Status: DC
  Filled 2018-07-21: qty 1

## 2018-07-21 MED ORDER — ENOXAPARIN SODIUM 40 MG/0.4ML ~~LOC~~ SOLN
40.0000 mg | SUBCUTANEOUS | Status: DC
  Administered 2018-07-22: 40 mg via SUBCUTANEOUS
  Filled 2018-07-21 (×3): qty 0.4

## 2018-07-21 MED ORDER — IBUPROFEN 400 MG PO TABS
800.0000 mg | ORAL_TABLET | Freq: Three times a day (TID) | ORAL | Status: DC
  Administered 2018-07-21: 800 mg via ORAL
  Filled 2018-07-21: qty 2
  Filled 2018-07-21: qty 1

## 2018-07-21 MED ORDER — PANTOPRAZOLE SODIUM 40 MG PO TBEC: 40.0000 mg | DELAYED_RELEASE_TABLET | Freq: Every day | ORAL | Status: DC

## 2018-07-21 MED ORDER — ACETAMINOPHEN 650 MG RE SUPP: 650.0000 mg | Freq: Four times a day (QID) | RECTAL | Status: DC | PRN

## 2018-07-21 MED ORDER — PANTOPRAZOLE SODIUM 40 MG PO TBEC
40.0000 mg | DELAYED_RELEASE_TABLET | Freq: Every day | ORAL | Status: DC
  Administered 2018-07-22 – 2018-07-23 (×2): 40 mg via ORAL
  Filled 2018-07-21 (×2): qty 1

## 2018-07-21 MED ORDER — COLCHICINE 0.6 MG PO TABS: 0.6000 mg | ORAL_TABLET | Freq: Two times a day (BID) | ORAL | Status: DC

## 2018-07-21 NOTE — ED Triage Notes (Addendum)
Pt BIB  ems for upper back pain that started in her lower back and radiated to her abd but now. Pt discharged from hospital around 1200 today. Dissection studies done during visit and states they told her the pain was musculoskeletal. Pt a.o, nad at this time.   BP 104/68 P 94 95% room air   Pt also reports a fever today, took tylenol at home.

## 2018-07-21 NOTE — Progress Notes (Signed)
Cardiology Progress Note Pt seen this AM by Dr. Eden Emms w/ atypical CP; she was actually discharged this afternoon and then re-admitted after returning to the ED w/ the same sx. She has been re-admitted to the medicine svc. There is a concern that her atypical pain may be due to pericarditis as her trop tonight is 0.08. there are no acute EKG changes.  Empiric course of high-dose NSAIDS not unreasonable. Would recommend TTE during this stay.  Cardiology will cont to follow  Thank you for the opportunity to participate in the care of this patient.  For questions or updates, please contact [facility] Please consult [email or web address] for contact info under   Precious Reel, MD , [facility] 07/21/18 9:43 PM

## 2018-07-21 NOTE — ED Notes (Signed)
Dr. Ranae Palms notified of critical troponin of 0.08.

## 2018-07-21 NOTE — ED Provider Notes (Signed)
[facility] EMERGENCY DEPARTMENT Provider Note   CSN: [id number] Arrival date & time: 07/21/18  1818     History   Chief Complaint Chief Complaint  Patient presents with  . Back Pain    HPI Erica Oneill is a 63 y.o. female.  63 y.o female with a PMH of HTN presents to the ED with a chief complaint of upper back pain. Patient was seen in the ED yesterday and admitted for ACS rule out. Patient reports taking tylenol x 2 prior to discharge from hospital this morning, she reports arriving home and since she had a T-max of 100.1, she reports her back pain had worsened and she had taken some toradol which she was prescribed on discharge.  Patient reports the pain is mainly located on her upper back, it is worse when she lays flat and pain is better when she leans forward.  Patient reports she was slightly hypotensive yesterday, she is on a beta-blocker for her high blood pressure, however due to losing over 65 pounds recently her blood pressure has proved and she no longer takes her beta-blocker.  Reports there is tenderness to palpation along upper back.  She denies any shortness of breath,headache, weakness or other complaints.   The history is provided by the patient and a relative.  Back Pain  Location:  Thoracic spine Quality:  Stabbing Pain severity:  Moderate Onset quality:  Gradual Timing:  Constant Associated symptoms: no abdominal pain, no chest pain, no dysuria and no fever     Past Medical History:  Diagnosis Date  . Hypertension     Patient Active Problem List   Diagnosis Date Noted  . Chest pain, rule out acute myocardial infarction 07/20/2018  . Syncope 07/20/2018    Past Surgical History:  Procedure Laterality Date  . ABDOMINAL HYSTERECTOMY    . TORN MENISCUS IN LEFT KNEE Left      OB History   No obstetric history on file.      Home Medications    Prior to Admission medications   Medication Sig Start Date End Date Taking?  Authorizing Provider  ketorolac (TORADOL) 10 MG tablet Take 1 tablet (10 mg total) by mouth every 6 (six) hours as needed. Patient not taking: Reported on 07/21/2018 07/21/18   Leatha Gilding, MD    Family History Family History  Problem Relation Age of Onset  . Hypertension Mother   . Heart attack Mother 43  . Cancer Father   . Heart attack Maternal Grandfather 38    Social History Social History   Tobacco Use  . Smoking status: Never Smoker  . Smokeless tobacco: Never Used  Substance Use Topics  . Alcohol use: Not Currently  . Drug use: Never     Allergies   Lidocaine; Rocephin [ceftriaxone]; and Other   Review of Systems Review of Systems  Constitutional: Negative for chills and fever.  HENT: Negative for ear pain and sore throat.   Eyes: Negative for pain and visual disturbance.  Respiratory: Negative for cough, shortness of breath and wheezing.   Cardiovascular: Negative for chest pain and palpitations.  Gastrointestinal: Negative for abdominal pain and vomiting.  Genitourinary: Negative for dysuria and hematuria.  Musculoskeletal: Positive for back pain and myalgias. Negative for arthralgias, neck pain and neck stiffness.  Skin: Negative for color change and rash.  Neurological: Negative for seizures and syncope.  All other systems reviewed and are negative.    Physical Exam Updated Vital Signs BP 101/61  Pulse 97   Temp (!) 101.4 F (38.6 C) (Oral)   Resp (!) 27   SpO2 97%   Physical Exam Vitals signs and nursing note reviewed.  Constitutional:      General: She is not in acute distress.    Appearance: She is well-developed.  HENT:     Head: Normocephalic and atraumatic.     Mouth/Throat:     Pharynx: No oropharyngeal exudate.  Eyes:     Pupils: Pupils are equal, round, and reactive to light.  Neck:     Musculoskeletal: Normal range of motion.  Cardiovascular:     Rate and Rhythm: Regular rhythm.     Heart sounds: Normal heart sounds.    Pulmonary:     Effort: Pulmonary effort is normal. No respiratory distress.     Breath sounds: Normal breath sounds.  Abdominal:     General: Bowel sounds are normal. There is no distension.     Palpations: Abdomen is soft.     Tenderness: There is no abdominal tenderness.  Musculoskeletal:        General: No deformity.     Cervical back: She exhibits tenderness, pain and spasm. She exhibits no swelling and no edema.       Back:     Right lower leg: No edema.     Left lower leg: No edema.  Skin:    General: Skin is warm and dry.  Neurological:     Mental Status: She is alert and oriented to person, place, and time.     Comments: Alert, oriented, thought content appropriate. Speech fluent without evidence of aphasia. Able to follow 2 step commands without difficulty.  Cranial Nerves:  II:  Peripheral visual fields grossly normal, pupils, round, reactive to light III,IV, VI: ptosis not present, extra-ocular motions intact bilaterally  V,VII: smile symmetric, facial light touch sensation equal VIII: hearing grossly normal bilaterally  IX,X: midline uvula rise  XI: bilateral shoulder shrug equal and strong XII: midline tongue extension  Motor:  5/5 in upper and lower extremities bilaterally including strong and equal grip strength and dorsiflexion/plantar flexion Sensory: light touch normal in all extremities.  Cerebellar: normal finger-to-nose with bilateral upper extremities, pronator drift negative       ED Treatments / Results  Labs (all labs ordered are listed, but only abnormal results are displayed) Labs Reviewed  CBC WITH DIFFERENTIAL/PLATELET - Abnormal; Notable for the following components:      Result Value   WBC 20.3 (*)    MCV 100.7 (*)    Platelets 132 (*)    Neutro Abs 17.0 (*)    Monocytes Absolute 2.2 (*)    Abs Immature Granulocytes 0.13 (*)    All other components within normal limits  TROPONIN I - Abnormal; Notable for the following components:    Troponin I 0.08 (*)    All other components within normal limits  COMPREHENSIVE METABOLIC PANEL - Abnormal; Notable for the following components:   Sodium 134 (*)    CO2 18 (*)    Glucose, Bld 132 (*)    Calcium 8.5 (*)    Total Protein 6.2 (*)    Albumin 2.8 (*)    Total Bilirubin 2.0 (*)    All other components within normal limits  CULTURE, BLOOD (ROUTINE X 2)  CULTURE, BLOOD (ROUTINE X 2)    EKG None  Radiology Dg Chest 2 View  Result Date: 07/21/2018 CLINICAL DATA:  Back pain EXAM: CHEST - 2 VIEW COMPARISON:  07/20/2018 FINDINGS: Cardiac shadow is enlarged but stable. Increasing bibasilar atelectatic changes are seen. Small effusions are noted as well. These have increased in the interval from the prior exam. No focal confluent infiltrate is seen. Degenerative changes of the thoracic spine are noted. IMPRESSION: Increasing bibasilar atelectasis and small effusions. Electronically Signed   By: Alcide Clever M.D.   On: 07/21/2018 20:10   Ct Angio Chest/abd/pel For Dissection W And/or Wo Contrast  Result Date: 07/20/2018 CLINICAL DATA:  Upper back pain extending into the chest EXAM: CT ANGIOGRAPHY CHEST, ABDOMEN AND PELVIS TECHNIQUE: Multidetector CT imaging through the chest, abdomen and pelvis was performed using the standard protocol during bolus administration of intravenous contrast. Multiplanar reconstructed images and MIPs were obtained and reviewed to evaluate the vascular anatomy. CONTRAST:  ISOVUE-370 IOPAMIDOL (ISOVUE-370) INJECTION 76% COMPARISON:  Chest radiograph 11/02/2017 FINDINGS: CTA CHEST FINDINGS Cardiovascular: On the noncontrast images there is mild atherosclerotic calcification of the thoracic aorta but no findings of acute intramural hematoma. Following contrast administration we demonstrate no evidence of aortic or branch vessel dissection. Mild cardiomegaly is present and there is a small pericardial effusion. Today's exam was not optimized specifically to  assess for pulmonary embolus, but we do not demonstrate any large filling defect in the central pulmonary arteries. Mediastinum/Nodes: Unremarkable Lungs/Pleura: There is dependent atelectasis in both lower lobes. Musculoskeletal: Thoracic spondylosis. Review of the MIP images confirms the above findings. CTA ABDOMEN AND PELVIS FINDINGS VASCULAR Aorta: No significant atherosclerotic calcification of the abdominal aorta. No aneurysm or dissection. Celiac: Patent celiac trunk giving rise to the common hepatic and ultimately the proper hepatic artery. No splenic artery aneurysm. SMA: Patent and unremarkable. Renals: 2 left and single right renal arteries appear patent and unremarkable. IMA: Patent and unremarkable. Inflow: Unremarkable Veins: Incidental retroaortic left renal vein. Review of the MIP images confirms the above findings. NON-VASCULAR Hepatobiliary: Arterial phase appearance of the liver is unremarkable. The gallbladder is mildly prominent but without peritubal wall thickening. Pancreas: Unremarkable Spleen: Typical striated arterial phase enhancement pattern without specific splenic lesion identified. Adrenals/Urinary Tract: 10 mm hypodense lesion of the right kidney upper pole is likely a cyst but technically nonspecific due to small size the adrenal glands appear normal. Urinary bladder normal. Stomach/Bowel: Unremarkable Lymphatic: No lymphadenopathy identified. Reproductive: Uterus absent.  Adnexa unremarkable. Other: No supplemental non-categorized findings. Musculoskeletal: Mild grade 1 degenerative retrolisthesis at L3-4. Degenerative facet arthropathy at L5-S1. Review of the MIP images confirms the above findings. IMPRESSION: 1. No acute vascular findings. There is only minimal atherosclerotic calcification of the aortic arch. No dissection observed. 2. Mild cardiomegaly.  Trace pericardial effusion. 3. Mild dependent atelectasis in both lower lobes. 4. 10 mm hypodense lesion of the right kidney  upper pole is likely a cyst but technically nonspecific. Electronically Signed   By: Gaylyn Rong M.D.   On: 07/20/2018 17:17    Procedures Procedures (including critical care time)  Medications Ordered in ED Medications  acetaminophen (TYLENOL) tablet 650 mg (has no administration in time range)     Initial Impression / Assessment and Plan / ED Course  I have reviewed the triage vital signs and the nursing notes.  Pertinent labs & imaging results that were available during my care of the patient were reviewed by me and considered in my medical decision making (see chart for details).   Patient with a chief complaint of chest pain radiating to her back presents to the ED after being discharged this morning. Patient reports she was  discharged from the hospitalist from chest pain r/o and when she arrived at home she is a fever of 100.1, she reports her back pain is worse, she proceeded to take some Toradol which helped some with her pain.  Patient states she knows "there is something wrong on my back I know it ".  Patient patient's pain is more so positional she reports her pain is a 10 out of 10 when laying flat but a 1 out of 10 with leaning forward. Will obtain screening labs along with Xray and EKG to further evaluate.  CMP showed slight decrease in sodium, no other electrolyte abnormality. CBC showed increase in white count at 20.3 from yesterday's visit 14.0. First troponin was elevated at 0.08. DG Chest xray showed: Increasing bibasilar atelectasis and small effusions.     Patient has a CT dissection study done yesterday which was negative. Due to patient's worsening back pain along with elevation in white blood cell count and fever, I believe re admission is warranted at this time. Will consult cardiology prior to hospitalist for their recommendations.   8:48 PM Spoke to cardiology who recommended patient be admitted to the hospitalist service. Patient was seen by cardiology  while in the hospital and was advised to have an ECHO, however she was discharged as she was told this could be done on a outpatient basis.   Final Clinical Impressions(s) / ED Diagnoses   Final diagnoses:  Acute midline thoracic back pain  Fever, unspecified fever cause    ED Discharge Orders    None       Freddy Jaksch 07/21/18 2111    Loren Racer, MD 07/22/18 2312

## 2018-07-21 NOTE — Discharge Instructions (Signed)
Follow with Hadley Pen, MD in 5-7 days  Please get a complete blood count and chemistry panel checked by your Primary MD at your next visit, and again as instructed by your Primary MD. Please get your medications reviewed and adjusted by your Primary MD.  Please request your Primary MD to go over all Hospital Tests and Procedure/Radiological results at the follow up, please get all Hospital records sent to your Prim MD by signing hospital release before you go home.  If you had Pneumonia of Lung problems at the Hospital: Please get a 2 view Chest X ray done in 6-8 weeks after hospital discharge or sooner if instructed by your Primary MD.  If you have Congestive Heart Failure: Please call your Cardiologist or Primary MD anytime you have any of the following symptoms:  1) 3 pound weight gain in 24 hours or 5 pounds in 1 week  2) shortness of breath, with or without a dry hacking cough  3) swelling in the hands, feet or stomach  4) if you have to sleep on extra pillows at night in order to breathe  Follow cardiac low salt diet and 1.5 lit/day fluid restriction.  If you have diabetes Accuchecks 4 times/day, Once in AM empty stomach and then before each meal. Log in all results and show them to your primary doctor at your next visit. If any glucose reading is under 80 or above 300 call your primary MD immediately.  If you have Seizure/Convulsions/Epilepsy: Please do not drive, operate heavy machinery, participate in activities at heights or participate in high speed sports until you have seen by Primary MD or a Neurologist and advised to do so again.  If you had Gastrointestinal Bleeding: Please ask your Primary MD to check a complete blood count within one week of discharge or at your next visit. Your endoscopic/colonoscopic biopsies that are pending at the time of discharge, will also need to followed by your Primary MD.  Get Medicines reviewed and adjusted. Please take all your  medications with you for your next visit with your Primary MD  Please request your Primary MD to go over all hospital tests and procedure/radiological results at the follow up, please ask your Primary MD to get all Hospital records sent to his/her office.  If you experience worsening of your admission symptoms, develop shortness of breath, life threatening emergency, suicidal or homicidal thoughts you must seek medical attention immediately by calling 911 or calling your MD immediately  if symptoms less severe.  You must read complete instructions/literature along with all the possible adverse reactions/side effects for all the Medicines you take and that have been prescribed to you. Take any new Medicines after you have completely understood and accpet all the possible adverse reactions/side effects.   Do not drive or operate heavy machinery when taking Pain medications.   Do not take more than prescribed Pain, Sleep and Anxiety Medications  Special Instructions: If you have smoked or chewed Tobacco  in the last 2 yrs please stop smoking, stop any regular Alcohol  and or any Recreational drug use.  Wear Seat belts while driving.  Please note You were cared for by a hospitalist during your hospital stay. If you have any questions about your discharge medications or the care you received while you were in the hospital after you are discharged, you can call the unit and asked to speak with the hospitalist on call if the hospitalist that took care of you is not available.  Once you are discharged, your primary care physician will handle any further medical issues. Please note that NO REFILLS for any discharge medications will be authorized once you are discharged, as it is imperative that you return to your primary care physician (or establish a relationship with a primary care physician if you do not have one) for your aftercare needs so that they can reassess your need for medications and monitor your  lab values.  You can reach the hospitalist office at phone [phone] or fax [phone]   If you do not have a primary care physician, you can call [phone] for a physician referral.  Activity: As tolerated with Full fall precautions use walker/cane & assistance as needed  Diet: regular  Disposition Home

## 2018-07-21 NOTE — ED Notes (Signed)
Pt. Called out stating she was feeling her temp. Rising; temp rechecked per pt. Request

## 2018-07-21 NOTE — ED Notes (Signed)
Delay in blood cultures MD at bedside.

## 2018-07-21 NOTE — Progress Notes (Signed)
Progress Note  Patient Name: Erica Oneill Date of Encounter: 07/21/2018  Primary Cardiologist:  [facility]  Subjective   No pain wants to go home  Inpatient Medications    Scheduled Meds: . enoxaparin (LOVENOX) injection  40 mg Subcutaneous Q24H  . ketorolac  15 mg Intravenous Once   Continuous Infusions: . sodium chloride 125 mL/hr at 07/21/18 0115   PRN Meds: acetaminophen, fentaNYL (SUBLIMAZE) injection, ondansetron (ZOFRAN) IV   Vital Signs    Vitals:   07/21/18 0447 07/21/18 0500 07/21/18 0649 07/21/18 0652  BP: (!) 90/59   (!) 86/51  Pulse: 86   83  Resp: 18     Temp: 100 F (37.8 C) (!) 100.5 F (38.1 C) 98.9 F (37.2 C)   TempSrc: Oral Oral Oral   SpO2: 95%     Weight:      Height:        Intake/Output Summary (Last 24 hours) at 07/21/2018 0807 Last data filed at 07/21/2018 0500 Gross per 24 hour  Intake 2386.61 ml  Output 1000 ml  Net 1386.61 ml   Last 3 Weights 07/21/2018 07/20/2018  Weight (lbs) 158 lb 4.8 oz 149 lb  Weight (kg) 71.804 kg 67.586 kg      Telemetry    NSR 07/21/2018  - Personally Reviewed  ECG    NSR normal  - Personally Reviewed  Physical Exam   GEN: No acute distress.   Neck: No JVD Cardiac: RRR, no murmurs, rubs, or gallops.  Respiratory: Clear to auscultation bilaterally. GI: Soft, nontender, non-distended  MS: No edema; No deformity. Neuro:  Nonfocal  Psych: Normal affect   Labs    Chemistry Recent Labs  Lab 07/20/18 1356  NA 136  K 3.5  CL 101  CO2 25  GLUCOSE 128*  BUN 12  CREATININE 0.78  CALCIUM 8.8*  PROT 6.5  ALBUMIN 3.4*  AST 18  ALT 16  ALKPHOS 64  BILITOT 1.2  GFRNONAA >60  GFRAA >60  ANIONGAP 10     Hematology Recent Labs  Lab 07/20/18 1356  WBC 14.0*  RBC 4.40  HGB 13.5  HCT 43.3  MCV 98.4  MCH 30.7  MCHC 31.2  RDW 11.8  PLT 172    Cardiac Enzymes Recent Labs  Lab 07/20/18 2145 07/21/18 0129  TROPONINI <0.03 <0.03    Recent Labs  Lab 07/20/18 1409  07/20/18 1830  TROPIPOC 0.00 0.01     BNPNo results for input(s): BNP, PROBNP in the last 168 hours.   DDimer No results for input(s): DDIMER in the last 168 hours.   Radiology    Ct Angio Chest/abd/pel For Dissection W And/or Wo Contrast  Result Date: 07/20/2018 CLINICAL DATA:  Upper back pain extending into the chest EXAM: CT ANGIOGRAPHY CHEST, ABDOMEN AND PELVIS TECHNIQUE: Multidetector CT imaging through the chest, abdomen and pelvis was performed using the standard protocol during bolus administration of intravenous contrast. Multiplanar reconstructed images and MIPs were obtained and reviewed to evaluate the vascular anatomy. CONTRAST:  ISOVUE-370 IOPAMIDOL (ISOVUE-370) INJECTION 76% COMPARISON:  Chest radiograph 11/02/2017 FINDINGS: CTA CHEST FINDINGS Cardiovascular: On the noncontrast images there is mild atherosclerotic calcification of the thoracic aorta but no findings of acute intramural hematoma. Following contrast administration we demonstrate no evidence of aortic or branch vessel dissection. Mild cardiomegaly is present and there is a small pericardial effusion. Today's exam was not optimized specifically to assess for pulmonary embolus, but we do not demonstrate any large filling defect in the central  pulmonary arteries. Mediastinum/Nodes: Unremarkable Lungs/Pleura: There is dependent atelectasis in both lower lobes. Musculoskeletal: Thoracic spondylosis. Review of the MIP images confirms the above findings. CTA ABDOMEN AND PELVIS FINDINGS VASCULAR Aorta: No significant atherosclerotic calcification of the abdominal aorta. No aneurysm or dissection. Celiac: Patent celiac trunk giving rise to the common hepatic and ultimately the proper hepatic artery. No splenic artery aneurysm. SMA: Patent and unremarkable. Renals: 2 left and single right renal arteries appear patent and unremarkable. IMA: Patent and unremarkable. Inflow: Unremarkable Veins: Incidental retroaortic left renal  vein. Review of the MIP images confirms the above findings. NON-VASCULAR Hepatobiliary: Arterial phase appearance of the liver is unremarkable. The gallbladder is mildly prominent but without peritubal wall thickening. Pancreas: Unremarkable Spleen: Typical striated arterial phase enhancement pattern without specific splenic lesion identified. Adrenals/Urinary Tract: 10 mm hypodense lesion of the right kidney upper pole is likely a cyst but technically nonspecific due to small size the adrenal glands appear normal. Urinary bladder normal. Stomach/Bowel: Unremarkable Lymphatic: No lymphadenopathy identified. Reproductive: Uterus absent.  Adnexa unremarkable. Other: No supplemental non-categorized findings. Musculoskeletal: Mild grade 1 degenerative retrolisthesis at L3-4. Degenerative facet arthropathy at L5-S1. Review of the MIP images confirms the above findings. IMPRESSION: 1. No acute vascular findings. There is only minimal atherosclerotic calcification of the aortic arch. No dissection observed. 2. Mild cardiomegaly.  Trace pericardial effusion. 3. Mild dependent atelectasis in both lower lobes. 4. 10 mm hypodense lesion of the right kidney upper pole is likely a cyst but technically nonspecific. Electronically Signed   By: Gaylyn RongWalter  Liebkemann M.D.   On: 07/20/2018 17:17    Cardiac Studies   None  Patient Profile     63 y.o. female with atypical muscular back pain r/o normal ECG  Assessment & Plan    Chest Pain:  Ok to d/c home does not need inpatient echo or stress testing. Will arrange  F/u with [facility] in [place] for outpatient stress testing BP soft with 65 lb weight loss over last year d/c bystolic    [facility] will sign off.   Medication Recommendations:  D/c bystolic  Other recommendations (labs, testing, etc):  D/c Follow up as an outpatient:  [facility]  For questions or updates, please contact [facility] Please consult [email or web address] for contact info under         Signed, Charlton HawsPeter Renzo Vincelette, MD  07/21/2018, 8:07 AM

## 2018-07-21 NOTE — H&P (Signed)
History and Physical    Erica Oneill [id number] DOB: 05/01/1956 DOA: 07/21/2018  PCP: Hadley Penobbins, Robert A, MD  Patient coming from: Home  I have personally briefly reviewed patient's old medical records [facility]  Chief Complaint: Chest pressure, upper back pain, fever  HPI: Erica Oneill is a 63 y.o. female with medical history significant for hypertension returns [facility] the [facility] ED with chest pressure, upper back pain, and fevers.    Patient was just admitted from 1/25-1/26/20 for chest and back pain which initially began about 3 days ago.  Her EKG did not show any significant ischemic changes and cardiac enzymes were negative.  Cardiology were consulted recommend outpatient stress testing.  Her back pain was felt musculoskeletal and she responded well to Toradol.  Patient returns to the ED several hours after discharge with subjective fevers, chest pressure radiating from her back through the front of her chest, as well as upper back pain/tightness.  She does report relief of her back pain with Toradol earlier but her symptoms keep returning.  She has associated shortness of breath with deep inspiration but not with ambulation or at rest.  Her chest discomfort is relieved when sitting forward.  She does report recent heavy lifting.  She denies any nausea, vomiting, abdominal pain, diarrhea, constipation, dysuria, or rash.  She reports sick contacts as she is visiting her mother who is in the hospital as well as a grandchild who has suspected coxsackievirus.  ED Course:  Initial vitals showed BP 110/65, pulse 103, RR 20, temp 98.8 Fahrenheit, SPO2 94%.  Repeat temperature was 101.4 F.  Labs are notable for WBC 20.3 increased from 14.0 yesterday.  BUN 9, creatinine 1.78.  Troponin trend <0.03 >> 0.08.  EKG showed Sinus tachycardia, rate 102 bpm, low voltage, nonspecific T wave changes leads III, aVF, V1-V5.  Tachycardia and T wave changes are new compared to 07/20/2018.   Slight PR depressions noted on prior EKG.  2 view chest x-ray showed bibasilar atelectasis and small bilateral effusions.  Cardiology were consulted and recommended admission with inpatient echocardiogram.  The hospitalist service was consulted to admit for further evaluation management.   Review of Systems: As per HPI otherwise 10 point review of systems negative.    Past Medical History:  Diagnosis Date  . Hypertension     Past Surgical History:  Procedure Laterality Date  . ABDOMINAL HYSTERECTOMY    . TORN MENISCUS IN LEFT KNEE Left      reports that she has never smoked. She has never used smokeless tobacco. She reports previous alcohol use. She reports that she does not use drugs.  Allergies  Allergen Reactions  . Lidocaine Anaphylaxis  . Rocephin [Ceftriaxone] Anaphylaxis  . Other Rash and Hypertension    seafood    Family History  Problem Relation Age of Onset  . Hypertension Mother   . Heart attack Mother 2681  . Cancer Father   . Heart attack Maternal Grandfather 49     Prior to Admission medications   Medication Sig Start Date End Date Taking? Authorizing Provider  ketorolac (TORADOL) 10 MG tablet Take 1 tablet (10 mg total) by mouth every 6 (six) hours as needed. Patient not taking: Reported on 07/21/2018 07/21/18   Leatha GildingGherghe, Costin M, MD    Physical Exam: Vitals:   07/21/18 2059 07/21/18 2115 07/21/18 2130 07/21/18 2145  BP:  112/62 120/63 118/73  Pulse:  97 (!) 106 (!) 103  Resp:  (!) 28 (!) 29 Marland Kitchen(!)  32  Temp: (!) 101.4 F (38.6 C)     TempSrc: Oral     SpO2:  96% 96% 95%    Constitutional: NAD, calm, comfortable, resting supine in bed Eyes: PERRL, lids and conjunctivae normal ENMT: Mucous membranes are moist. Posterior pharynx clear of any exudate or lesions. Normal dentition.  Neck: normal, supple, no masses. Respiratory: Faint inspiratory crackles bilaterally. Normal respiratory effort. No accessory muscle use.  Cardiovascular: Distant heart  sounds, regular rate and rhythm, no appreciable murmurs / rubs / gallops. No extremity edema. Abdomen: no tenderness, no masses palpated. No hepatosplenomegaly.  Musculoskeletal: no clubbing / cyanosis. No joint deformity upper and lower extremities. Good ROM, no contractures. Normal muscle tone.  Skin: no rashes, lesions, ulcers. No induration Neurologic: CN 2-12 grossly intact. Sensation intact, Strength 5/5 in all 4.  Psychiatric: Normal judgment and insight. Alert and oriented x 3. Normal mood.     Labs on Admission: I have personally reviewed following labs and imaging studies  CBC: Recent Labs  Lab 07/20/18 1356 07/21/18 1827  WBC 14.0* 20.3*  NEUTROABS 11.1* 17.0*  HGB 13.5 12.8  HCT 43.3 41.5  MCV 98.4 100.7*  PLT 172 132*   Basic Metabolic Panel: Recent Labs  Lab 07/20/18 1356 07/21/18 1827  NA 136 134*  K 3.5 4.4  CL 101 106  CO2 25 18*  GLUCOSE 128* 132*  BUN 12 9  CREATININE 0.78 0.78  CALCIUM 8.8* 8.5*   GFR: Estimated Creatinine Clearance: 70.5 mL/min (by C-G formula based on SCr of 0.78 mg/dL). Liver Function Tests: Recent Labs  Lab 07/20/18 1356 07/21/18 1827  AST 18 22  ALT 16 11  ALKPHOS 64 60  BILITOT 1.2 2.0*  PROT 6.5 6.2*  ALBUMIN 3.4* 2.8*   No results for input(s): LIPASE, AMYLASE in the last 168 hours. No results for input(s): AMMONIA in the last 168 hours. Coagulation Profile: No results for input(s): INR, PROTIME in the last 168 hours. Cardiac Enzymes: Recent Labs  Lab 07/20/18 2145 07/21/18 0129 07/21/18 1827  TROPONINI <0.03 <0.03 0.08*   BNP (last 3 results) No results for input(s): PROBNP in the last 8760 hours. HbA1C: No results for input(s): HGBA1C in the last 72 hours. CBG: No results for input(s): GLUCAP in the last 168 hours. Lipid Profile: No results for input(s): CHOL, HDL, LDLCALC, TRIG, CHOLHDL, LDLDIRECT in the last 72 hours. Thyroid Function Tests: No results for input(s): TSH, T4TOTAL, FREET4, T3FREE,  THYROIDAB in the last 72 hours. Anemia Panel: No results for input(s): VITAMINB12, FOLATE, FERRITIN, TIBC, IRON, RETICCTPCT in the last 72 hours. Urine analysis:    Component Value Date/Time   COLORURINE YELLOW 07/20/2018 1441   APPEARANCEUR HAZY (A) 07/20/2018 1441   LABSPEC 1.009 07/20/2018 1441   PHURINE 6.0 07/20/2018 1441   GLUCOSEU NEGATIVE 07/20/2018 1441   HGBUR NEGATIVE 07/20/2018 1441   BILIRUBINUR NEGATIVE 07/20/2018 1441   KETONESUR NEGATIVE 07/20/2018 1441   PROTEINUR NEGATIVE 07/20/2018 1441   NITRITE NEGATIVE 07/20/2018 1441   LEUKOCYTESUR SMALL (A) 07/20/2018 1441    Radiological Exams on Admission: Dg Chest 2 View  Result Date: 07/21/2018 CLINICAL DATA:  Back pain EXAM: CHEST - 2 VIEW COMPARISON:  07/20/2018 FINDINGS: Cardiac shadow is enlarged but stable. Increasing bibasilar atelectatic changes are seen. Small effusions are noted as well. These have increased in the interval from the prior exam. No focal confluent infiltrate is seen. Degenerative changes of the thoracic spine are noted. IMPRESSION: Increasing bibasilar atelectasis and small effusions. Electronically Signed  By: Alcide CleverMark  Lukens M.D.   On: 07/21/2018 20:10   Ct Angio Chest/abd/pel For Dissection W And/or Wo Contrast  Result Date: 07/20/2018 CLINICAL DATA:  Upper back pain extending into the chest EXAM: CT ANGIOGRAPHY CHEST, ABDOMEN AND PELVIS TECHNIQUE: Multidetector CT imaging through the chest, abdomen and pelvis was performed using the standard protocol during bolus administration of intravenous contrast. Multiplanar reconstructed images and MIPs were obtained and reviewed to evaluate the vascular anatomy. CONTRAST:  100mL ISOVUE-370 IOPAMIDOL (ISOVUE-370) INJECTION 76% COMPARISON:  Chest radiograph 11/02/2017 FINDINGS: CTA CHEST FINDINGS Cardiovascular: On the noncontrast images there is mild atherosclerotic calcification of the thoracic aorta but no findings of acute intramural hematoma. Following  contrast administration we demonstrate no evidence of aortic or branch vessel dissection. Mild cardiomegaly is present and there is a small pericardial effusion. Today's exam was not optimized specifically to assess for pulmonary embolus, but we do not demonstrate any large filling defect in the central pulmonary arteries. Mediastinum/Nodes: Unremarkable Lungs/Pleura: There is dependent atelectasis in both lower lobes. Musculoskeletal: Thoracic spondylosis. Review of the MIP images confirms the above findings. CTA ABDOMEN AND PELVIS FINDINGS VASCULAR Aorta: No significant atherosclerotic calcification of the abdominal aorta. No aneurysm or dissection. Celiac: Patent celiac trunk giving rise to the common hepatic and ultimately the proper hepatic artery. No splenic artery aneurysm. SMA: Patent and unremarkable. Renals: 2 left and single right renal arteries appear patent and unremarkable. IMA: Patent and unremarkable. Inflow: Unremarkable Veins: Incidental retroaortic left renal vein. Review of the MIP images confirms the above findings. NON-VASCULAR Hepatobiliary: Arterial phase appearance of the liver is unremarkable. The gallbladder is mildly prominent but without peritubal wall thickening. Pancreas: Unremarkable Spleen: Typical striated arterial phase enhancement pattern without specific splenic lesion identified. Adrenals/Urinary Tract: 10 mm hypodense lesion of the right kidney upper pole is likely a cyst but technically nonspecific due to small size the adrenal glands appear normal. Urinary bladder normal. Stomach/Bowel: Unremarkable Lymphatic: No lymphadenopathy identified. Reproductive: Uterus absent.  Adnexa unremarkable. Other: No supplemental non-categorized findings. Musculoskeletal: Mild grade 1 degenerative retrolisthesis at L3-4. Degenerative facet arthropathy at L5-S1. Review of the MIP images confirms the above findings. IMPRESSION: 1. No acute vascular findings. There is only minimal  atherosclerotic calcification of the aortic arch. No dissection observed. 2. Mild cardiomegaly.  Trace pericardial effusion. 3. Mild dependent atelectasis in both lower lobes. 4. 10 mm hypodense lesion of the right kidney upper pole is likely a cyst but technically nonspecific. Electronically Signed   By: Gaylyn RongWalter  Liebkemann M.D.   On: 07/20/2018 17:17    EKG: Independently reviewed. Sinus tachycardia, rate 102 bpm, low voltage, nonspecific T wave changes leads III, aVF, V1-V5.  Tachycardia and T wave changes are new compared to 07/20/2018.  Slight PR depressions noted on prior EKG.  Assessment/Plan Principal Problem:   Pericarditis Active Problems:   Febrile illness   Acute upper back pain  Erica Oneill is a 63 y.o. female with medical history significant for hypertension [facility] same day after discharge with continued chest pressure, upper back pain, and new fevers admitted with suspected pericarditis.   Pericarditis: Symptomology and previous EKG suspicious for pericarditis.  She did have response to Toradol during recent admission. -Admit to telemetry -Obtain echocardiogram -Start colchicine 1.2 mg twice a day for 1 day then 0.6 mg twice daily for 3 months -Start ibuprofen 800 mg 3 times daily for 1-2 weeks, taper off afterwards -Start Protonix 40 mg daily for GI prophylaxis -Cycle troponins, check  CRP -monitor renal function and for signs/symptoms of bleeding while on high-dose NSAIDs  Febrile illness with leukocytosis: Likely secondary to above.  No obvious secondary infection on admission. She denies any dysuria. -Obtain blood cultures, continue monitor  Acute upper back pain: She does have upper back pain with paraspinal and spinous process tenderness in the upper thoracic region likely combination of pericarditis symptoms as well as MSK strain.  Low suspicion for vertebral discitis/osteomyelitis. -NSAIDs as above  Hypertension: No longer on  antihypertensives after intentional weight loss of about 60 pounds.  Continue to monitor.   DVT prophylaxis: Lovenox Code Status: Full code Family Communication: Discussed with patient and son at bedside Disposition Plan: Likely discharge home pending clinical progress Consults called: Cardiology consulted by EDP Admission status: Observation   Darreld Mclean MD [facility] Pager [phone]  If 7PM-7AM, please contact night-coverage [email or web address]  07/21/2018, 10:15 PM

## 2018-07-21 NOTE — Discharge Summary (Signed)
Physician Discharge Summary  Erica SiJanice B Oneill ZOX:[id number] DOB: 08/16/1955 DOA: 07/20/2018  PCP: Hadley Oneill, Erica A, MD  Admit date: 07/20/2018 Discharge date: 07/21/2018  Admitted From: home Disposition:  home  Recommendations for Outpatient Follow-up:  1. Follow up with PCP in 1-2 weeks 2. Cardiology will arrange outpatient stress test  Home Health: None Equipment/Devices: None  Discharge Condition: Stable CODE STATUS: Full code Diet recommendation: Heart healthy  HPI: Per admitting MD, Erica Oneill is a 63 y.o. female with medical history significant of HTN, obesity.  Patient has been on diet pills recently and lost significant weight.  Patient presents to ED with c/o CP and back pain.  Onset 730pm yesterday.  This morning PTA had episode of syncope.  BP at that time 80/40.  EMS transported her here to ED.  Hospital Course: Chest pain -patient was admitted to the hospital for chest pain rule out.  She was monitored overnight, cardiology was consulted and evaluated patient while hospitalized.  Her cardiac enzymes have remained negative and EKG did not show any significant ischemic changes.  Cardiology recommends outpatient stress test and she will be discharged home in stable condition and follow-up in office. Hypertension -patient on beta-blockers at home, however her blood pressure has been running in the 90s systolic at baseline recently.  She also has slight bradycardia while in the ED.  Her beta-blockers have been discontinued and she was instructed not to take them anymore.  In addition, she lost about 65 pounds, intentionally, which suspect has helped control her BP more.  She is feeling well off of beta-blocker, telemetry does not show any bradycardia, she is able to ambulate without difficulties, was advised to follow-up with PCP.  Suspect hypotension caused her to have the syncopal episode. Back pain -patient recently had increased activity and feels like her back is been  hurting more feels like that is radiating into her chest and is the main reason why she is here.  Responded well to Toradol, will prescribe few pills on discharge, advised not to take for more than 3 days.  Discharge Diagnoses:  Principal Problem:   Chest pain, rule out acute myocardial infarction Active Problems:   Syncope   Discharge Instructions  Allergies as of 07/21/2018      Reactions   Lidocaine Anaphylaxis   Rocephin [ceftriaxone] Anaphylaxis   Other Rash, Hypertension   seafood      Medication List    TAKE these medications   ketorolac 10 MG tablet Commonly known as:  TORADOL Take 1 tablet (10 mg total) by mouth every 6 (six) hours as needed.      Follow-up Information    Wendall StadeNishan, Erica C, MD Follow up in 1 week(s).   Specialty:  Cardiology Why:  cardiology should contact you regarding stress test, please call their office if you haven't heard within 2-3 days Contact information: [address] [place] [address] [phone]           Consultations:  Cardiology   Procedures/Studies:  Ct Angio Chest/abd/pel For Dissection W And/or Wo Contrast  Result Date: 07/20/2018 CLINICAL DATA:  Upper back pain extending into the chest EXAM: CT ANGIOGRAPHY CHEST, ABDOMEN AND PELVIS TECHNIQUE: Multidetector CT imaging through the chest, abdomen and pelvis was performed using the standard protocol during bolus administration of intravenous contrast. Multiplanar reconstructed images and MIPs were obtained and reviewed to evaluate the vascular anatomy. CONTRAST:  100mL ISOVUE-370 IOPAMIDOL (ISOVUE-370) INJECTION 76% COMPARISON:  Chest radiograph 11/02/2017 FINDINGS:  CTA CHEST FINDINGS Cardiovascular: On the noncontrast images there is mild atherosclerotic calcification of the thoracic aorta but no findings of acute intramural hematoma. Following contrast administration we demonstrate no evidence of aortic or branch vessel dissection. Mild cardiomegaly is  present and there is a small pericardial effusion. Today's exam was not optimized specifically to assess for pulmonary embolus, but we do not demonstrate any large filling defect in the central pulmonary arteries. Mediastinum/Nodes: Unremarkable Lungs/Pleura: There is dependent atelectasis in both lower lobes. Musculoskeletal: Thoracic spondylosis. Review of the MIP images confirms the above findings. CTA ABDOMEN AND PELVIS FINDINGS VASCULAR Aorta: No significant atherosclerotic calcification of the abdominal aorta. No aneurysm or dissection. Celiac: Patent celiac trunk giving rise to the common hepatic and ultimately the proper hepatic artery. No splenic artery aneurysm. SMA: Patent and unremarkable. Renals: 2 left and single right renal arteries appear patent and unremarkable. IMA: Patent and unremarkable. Inflow: Unremarkable Veins: Incidental retroaortic left renal vein. Review of the MIP images confirms the above findings. NON-VASCULAR Hepatobiliary: Arterial phase appearance of the liver is unremarkable. The gallbladder is mildly prominent but without peritubal wall thickening. Pancreas: Unremarkable Spleen: Typical striated arterial phase enhancement pattern without specific splenic lesion identified. Adrenals/Urinary Tract: 10 mm hypodense lesion of the right kidney upper pole is likely a cyst but technically nonspecific due to small size the adrenal glands appear normal. Urinary bladder normal. Stomach/Bowel: Unremarkable Lymphatic: No lymphadenopathy identified. Reproductive: Uterus absent.  Adnexa unremarkable. Other: No supplemental non-categorized findings. Musculoskeletal: Mild grade 1 degenerative retrolisthesis at L3-4. Degenerative facet arthropathy at L5-S1. Review of the MIP images confirms the above findings. IMPRESSION: 1. No acute vascular findings. There is only minimal atherosclerotic calcification of the aortic arch. No dissection observed. 2. Mild cardiomegaly.  Trace pericardial effusion.  3. Mild dependent atelectasis in both lower lobes. 4. 10 mm hypodense lesion of the right kidney upper pole is likely a cyst but technically nonspecific. Electronically Signed   By: Gaylyn Rong M.D.   On: 07/20/2018 17:17      Subjective: - no chest pain, shortness of breath, no abdominal pain, nausea or vomiting.  Wants to go home  Discharge Exam: Vitals:   07/21/18 0652 07/21/18 0833  BP: (!) 86/51 (!) 89/48  Pulse: 83 85  Resp:  20  Temp:  99.2 F (37.3 C)  SpO2:  96%    General: Pt is alert, awake, not in acute distress Cardiovascular: RRR, S1/S2 +, no rubs, no gallops Respiratory: CTA bilaterally, no wheezing, no rhonchi Abdominal: Soft, NT, ND, bowel sounds + Extremities: no edema, no cyanosis    The results of significant diagnostics from this hospitalization (including imaging, microbiology, ancillary and laboratory) are listed below for reference.     Microbiology: No results found for this or any previous visit (from the past 240 hour(s)).   Labs: BNP (last 3 results) No results for input(s): BNP in the last 8760 hours. Basic Metabolic Panel: Recent Labs  Lab 07/20/18 1356  NA 136  K 3.5  CL 101  CO2 25  GLUCOSE 128*  BUN 12  CREATININE 0.78  CALCIUM 8.8*   Liver Function Tests: Recent Labs  Lab 07/20/18 1356  AST 18  ALT 16  ALKPHOS 64  BILITOT 1.2  PROT 6.5  ALBUMIN 3.4*   No results for input(s): LIPASE, AMYLASE in the last 168 hours. No results for input(s): AMMONIA in the last 168 hours. CBC: Recent Labs  Lab 07/20/18 1356  WBC 14.0*  NEUTROABS 11.1*  HGB  13.5  HCT 43.3  MCV 98.4  PLT 172   Cardiac Enzymes: Recent Labs  Lab 07/20/18 2145 07/21/18 0129  TROPONINI <0.03 <0.03   BNP: Invalid input(s): POCBNP CBG: No results for input(s): GLUCAP in the last 168 hours. D-Dimer No results for input(s): DDIMER in the last 72 hours. Hgb A1c No results for input(s): HGBA1C in the last 72 hours. Lipid Profile No  results for input(s): CHOL, HDL, LDLCALC, TRIG, CHOLHDL, LDLDIRECT in the last 72 hours. Thyroid function studies No results for input(s): TSH, T4TOTAL, T3FREE, THYROIDAB in the last 72 hours.  Invalid input(s): FREET3 Anemia work up No results for input(s): VITAMINB12, FOLATE, FERRITIN, TIBC, IRON, RETICCTPCT in the last 72 hours. Urinalysis    Component Value Date/Time   COLORURINE YELLOW 07/20/2018 1441   APPEARANCEUR HAZY (A) 07/20/2018 1441   LABSPEC 1.009 07/20/2018 1441   PHURINE 6.0 07/20/2018 1441   GLUCOSEU NEGATIVE 07/20/2018 1441   HGBUR NEGATIVE 07/20/2018 1441   BILIRUBINUR NEGATIVE 07/20/2018 1441   KETONESUR NEGATIVE 07/20/2018 1441   PROTEINUR NEGATIVE 07/20/2018 1441   NITRITE NEGATIVE 07/20/2018 1441   LEUKOCYTESUR SMALL (A) 07/20/2018 1441   Sepsis Labs Invalid input(s): PROCALCITONIN,  WBC,  LACTICIDVEN   Time coordinating discharge: 35 minutes  SIGNED:  Pamella Pertostin Malorie Bigford, MD  [facility] 07/21/2018, 10:00 AM

## 2018-07-21 NOTE — ED Notes (Signed)
Patient transported to X-ray 

## 2018-07-22 ENCOUNTER — Observation Stay (HOSPITAL_BASED_OUTPATIENT_CLINIC_OR_DEPARTMENT_OTHER): Payer: BLUE CROSS/BLUE SHIELD

## 2018-07-22 ENCOUNTER — Other Ambulatory Visit: Payer: Self-pay

## 2018-07-22 DIAGNOSIS — Z881 Allergy status to other antibiotic agents status: Secondary | ICD-10-CM | POA: Diagnosis not present

## 2018-07-22 DIAGNOSIS — Z884 Allergy status to anesthetic agent status: Secondary | ICD-10-CM | POA: Diagnosis not present

## 2018-07-22 DIAGNOSIS — I319 Disease of pericardium, unspecified: Secondary | ICD-10-CM | POA: Diagnosis not present

## 2018-07-22 DIAGNOSIS — R509 Fever, unspecified: Secondary | ICD-10-CM

## 2018-07-22 DIAGNOSIS — I10 Essential (primary) hypertension: Secondary | ICD-10-CM | POA: Diagnosis present

## 2018-07-22 DIAGNOSIS — Z90710 Acquired absence of both cervix and uterus: Secondary | ICD-10-CM | POA: Diagnosis not present

## 2018-07-22 DIAGNOSIS — I309 Acute pericarditis, unspecified: Secondary | ICD-10-CM | POA: Diagnosis not present

## 2018-07-22 DIAGNOSIS — Z79899 Other long term (current) drug therapy: Secondary | ICD-10-CM | POA: Diagnosis not present

## 2018-07-22 DIAGNOSIS — R0789 Other chest pain: Secondary | ICD-10-CM | POA: Diagnosis present

## 2018-07-22 DIAGNOSIS — M549 Dorsalgia, unspecified: Secondary | ICD-10-CM | POA: Diagnosis not present

## 2018-07-22 DIAGNOSIS — I300 Acute nonspecific idiopathic pericarditis: Secondary | ICD-10-CM | POA: Diagnosis present

## 2018-07-22 DIAGNOSIS — Z8249 Family history of ischemic heart disease and other diseases of the circulatory system: Secondary | ICD-10-CM | POA: Diagnosis not present

## 2018-07-22 DIAGNOSIS — I401 Isolated myocarditis: Secondary | ICD-10-CM | POA: Diagnosis present

## 2018-07-22 DIAGNOSIS — M546 Pain in thoracic spine: Secondary | ICD-10-CM | POA: Diagnosis present

## 2018-07-22 LAB — ECHOCARDIOGRAM COMPLETE
Height: 65 in
Weight: 2560 oz

## 2018-07-22 LAB — CBC
HCT: 37.6 % (ref 36.0–46.0)
Hemoglobin: 12.1 g/dL (ref 12.0–15.0)
MCH: 31.6 pg (ref 26.0–34.0)
MCHC: 32.2 g/dL (ref 30.0–36.0)
MCV: 98.2 fL (ref 80.0–100.0)
Platelets: 153 10*3/uL (ref 150–400)
RBC: 3.83 MIL/uL — ABNORMAL LOW (ref 3.87–5.11)
RDW: 12.5 % (ref 11.5–15.5)
WBC: 20.1 10*3/uL — ABNORMAL HIGH (ref 4.0–10.5)
nRBC: 0 % (ref 0.0–0.2)

## 2018-07-22 LAB — URINALYSIS, ROUTINE W REFLEX MICROSCOPIC
Bacteria, UA: NONE SEEN
Bilirubin Urine: NEGATIVE
Glucose, UA: NEGATIVE mg/dL
Hgb urine dipstick: NEGATIVE
Ketones, ur: NEGATIVE mg/dL
Nitrite: NEGATIVE
Protein, ur: 30 mg/dL — AB
Specific Gravity, Urine: 1.025 (ref 1.005–1.030)
pH: 5 (ref 5.0–8.0)

## 2018-07-22 LAB — BASIC METABOLIC PANEL
Anion gap: 11 (ref 5–15)
BUN: 14 mg/dL (ref 8–23)
CO2: 20 mmol/L — ABNORMAL LOW (ref 22–32)
Calcium: 8.7 mg/dL — ABNORMAL LOW (ref 8.9–10.3)
Chloride: 105 mmol/L (ref 98–111)
Creatinine, Ser: 1.16 mg/dL — ABNORMAL HIGH (ref 0.44–1.00)
GFR calc Af Amer: 58 mL/min — ABNORMAL LOW (ref >60)
GFR calc non Af Amer: 50 mL/min — ABNORMAL LOW (ref >60)
Glucose, Bld: 102 mg/dL — ABNORMAL HIGH (ref 70–99)
Potassium: 3.6 mmol/L (ref 3.5–5.1)
Sodium: 136 mmol/L (ref 135–145)

## 2018-07-22 LAB — SEDIMENTATION RATE: Sed Rate: 65 mm/hr — ABNORMAL HIGH (ref 0–22)

## 2018-07-22 LAB — TROPONIN I: Troponin I: 0.06 ng/mL (ref <0.03)

## 2018-07-22 MED ORDER — ASPIRIN 325 MG PO TABS
650.0000 mg | ORAL_TABLET | Freq: Three times a day (TID) | ORAL | Status: DC
  Administered 2018-07-22 – 2018-07-23 (×4): 650 mg via ORAL
  Filled 2018-07-22 (×4): qty 2

## 2018-07-22 MED ORDER — KETOROLAC TROMETHAMINE 15 MG/ML IJ SOLN
INTRAMUSCULAR | Status: AC
  Filled 2018-07-22: qty 1

## 2018-07-22 MED ORDER — KETOROLAC TROMETHAMINE 15 MG/ML IJ SOLN
15.0000 mg | Freq: Once | INTRAMUSCULAR | Status: AC
  Administered 2018-07-22: 15 mg via INTRAVENOUS

## 2018-07-22 MED ORDER — COLCHICINE 0.6 MG PO TABS
0.6000 mg | ORAL_TABLET | Freq: Two times a day (BID) | ORAL | Status: DC
  Administered 2018-07-22 – 2018-07-23 (×3): 0.6 mg via ORAL
  Filled 2018-07-22 (×2): qty 1

## 2018-07-22 NOTE — Progress Notes (Signed)
DAILY PROGRESS NOTE   Patient Name: Erica Oneill Date of Encounter: 07/22/2018 Cardiologist: No primary care provider on file.  Chief Complaint   Chest pain recurred this morning  Patient Profile   Erica Oneill is a 63 y.o. female with a hx of HTN who was seen for chest discomfort and syncope at the request of [address]. The patient was felt to have atypical pain for ACS and discharged, however was readmitted the same day with recurrent chest pain, mildly elevated troponins and elevated CRP concerning for pericarditis.  Subjective   Some more chest pain this morning. Echo was being performed at bedside when I examined her - there is a small pericardial effusion. Mild trop elevation suggestive of myocarditic component. CRP elevated -no ESR. She has refused colchicine, but after further discussion, is willing to take it. She says she gets hypotensive with NSAIDs and this may have contributed to her syncope. Willing to take aspirin.  Objective   Vitals:   07/21/18 2315 07/22/18 0200 07/22/18 0414 07/22/18 0843  BP: 104/63 (!) 91/55 (!) 89/65 92/61  Pulse: 99 82 82 85  Resp: (!) 26 18  (!) 21  Temp:   97.9 F (36.6 C) 97.8 F (36.6 C)  TempSrc:   Oral Oral  SpO2: 92% 94% 97% 99%  Weight:   72.6 kg   Height:   '5\' 5"'$  (1.651 m)     Intake/Output Summary (Last 24 hours) at 07/22/2018 1015 Last data filed at 07/22/2018 0400 Gross per 24 hour  Intake 300 ml  Output -  Net 300 ml   Filed Weights   07/22/18 0414  Weight: 72.6 kg    Physical Exam   General appearance: alert and no distress Lungs: clear to auscultation bilaterally Heart: regular rate and rhythm, S1, S2 normal and no rub Extremities: extremities normal, atraumatic, no cyanosis or edema Neurologic: Grossly normal  Inpatient Medications    Scheduled Meds: . colchicine  1.2 mg Oral BID   Followed by  . colchicine  0.6 mg Oral BID  . enoxaparin (LOVENOX) injection  40 mg Subcutaneous Q24H  .  ibuprofen  800 mg Oral TID  . pantoprazole  40 mg Oral Daily  . sodium chloride flush  3 mL Intravenous Q12H    Continuous Infusions:   PRN Meds: acetaminophen **OR** acetaminophen, ondansetron **OR** ondansetron (ZOFRAN) IV   Labs   Results for orders placed or performed during the hospital encounter of 07/21/18 (from the past 48 hour(s))  CBC with Differential     Status: Abnormal   Collection Time: 07/21/18  6:27 PM  Result Value Ref Range   WBC 20.3 (H) 4.0 - 10.5 K/uL   RBC 4.12 3.87 - 5.11 MIL/uL   Hemoglobin 12.8 12.0 - 15.0 g/dL   HCT 41.5 36.0 - 46.0 %   MCV 100.7 (H) 80.0 - 100.0 fL   MCH 31.1 26.0 - 34.0 pg   MCHC 30.8 30.0 - 36.0 g/dL   RDW 12.2 11.5 - 15.5 %   Platelets 132 (L) 150 - 400 K/uL   nRBC 0.0 0.0 - 0.2 %   Neutrophils Relative % 84 %   Neutro Abs 17.0 (H) 1.7 - 7.7 K/uL   Lymphocytes Relative 4 %   Lymphs Abs 0.9 0.7 - 4.0 K/uL   Monocytes Relative 11 %   Monocytes Absolute 2.2 (H) 0.1 - 1.0 K/uL   Eosinophils Relative 0 %   Eosinophils Absolute 0.1 0.0 - 0.5 K/uL  Basophils Relative 0 %   Basophils Absolute 0.1 0.0 - 0.1 K/uL   Immature Granulocytes 1 %   Abs Immature Granulocytes 0.13 (H) 0.00 - 0.07 K/uL    Comment: Performed at [facility] [address], [place], [place] [address]  Troponin I - ONCE - STAT     Status: Abnormal   Collection Time: 07/21/18  6:27 PM  Result Value Ref Range   Troponin I 0.08 (HH) <0.03 ng/mL    Comment: CRITICAL RESULT CALLED TO, READ BACK BY AND VERIFIED WITH: K.WARD,RN @ 1953 07/21/2018 [place] Performed at [facility], [place] [address] [place], [place] [address]   Comprehensive metabolic panel     Status: Abnormal   Collection Time: 07/21/18  6:27 PM  Result Value Ref Range   Sodium 134 (L) 135 - 145 mmol/L   Potassium 4.4 3.5 - 5.1 mmol/L   Chloride 106 98 - 111 mmol/L   CO2 18 (L) 22 - 32 mmol/L   Glucose, Bld 132 (H) 70 - 99 mg/dL   BUN 9 8 - 23 mg/dL   Creatinine, Ser 0.78 0.44 -  1.00 mg/dL   Calcium 8.5 (L) 8.9 - 10.3 mg/dL   Total Protein 6.2 (L) 6.5 - 8.1 g/dL   Albumin 2.8 (L) 3.5 - 5.0 g/dL   AST 22 15 - 41 U/L   ALT 11 0 - 44 U/L   Alkaline Phosphatase 60 38 - 126 U/L   Total Bilirubin 2.0 (H) 0.3 - 1.2 mg/dL   GFR calc non Af Amer >60 >60 mL/min   GFR calc Af Amer >60 >60 mL/min   Anion gap 10 5 - 15    Comment: Performed at [facility], [place] [address], [place], [place] [address]  C-reactive protein     Status: Abnormal   Collection Time: 07/21/18  9:47 PM  Result Value Ref Range   CRP 22.7 (H) <1.0 mg/dL    Comment: Performed at [facility], [place] [address], [place], [place] [address]  Troponin I -     Status: Abnormal   Collection Time: 07/21/18  9:52 PM  Result Value Ref Range   Troponin I 0.05 (HH) <0.03 ng/mL    Comment: CRITICAL VALUE NOTED.  VALUE IS CONSISTENT WITH PREVIOUSLY REPORTED AND CALLED VALUE. Performed at [facility], [place] [address] [place], [place] [address]   Troponin I - Now Then Q6H     Status: Abnormal   Collection Time: 07/22/18  3:51 AM  Result Value Ref Range   Troponin I 0.06 (HH) <0.03 ng/mL    Comment: CRITICAL VALUE NOTED.  VALUE IS CONSISTENT WITH PREVIOUSLY REPORTED AND CALLED VALUE. Performed at [facility], [place] [address] [place], [place] [address]   Basic metabolic panel     Status: Abnormal   Collection Time: 07/22/18  3:51 AM  Result Value Ref Range   Sodium 136 135 - 145 mmol/L   Potassium 3.6 3.5 - 5.1 mmol/L    Comment: NO VISIBLE HEMOLYSIS   Chloride 105 98 - 111 mmol/L   CO2 20 (L) 22 - 32 mmol/L   Glucose, Bld 102 (H) 70 - 99 mg/dL   BUN 14 8 - 23 mg/dL   Creatinine, Ser 1.16 (H) 0.44 - 1.00 mg/dL   Calcium 8.7 (L) 8.9 - 10.3 mg/dL   GFR calc non Af Amer 50 (L) >60 mL/min   GFR calc Af Amer 58 (L) >60 mL/min  Anion gap 11 5 - 15    Comment: Performed at [facility] [address], [place], [place] [address]  CBC     Status: Abnormal   Collection Time:  07/22/18  3:51 AM  Result Value Ref Range   WBC 20.1 (H) 4.0 - 10.5 K/uL   RBC 3.83 (L) 3.87 - 5.11 MIL/uL   Hemoglobin 12.1 12.0 - 15.0 g/dL   HCT 37.6 36.0 - 46.0 %   MCV 98.2 80.0 - 100.0 fL   MCH 31.6 26.0 - 34.0 pg   MCHC 32.2 30.0 - 36.0 g/dL   RDW 12.5 11.5 - 15.5 %   Platelets 153 150 - 400 K/uL   nRBC 0.0 0.0 - 0.2 %    Comment: Performed at [facility], [place] [address] [place], [place] [address]    ECG   Sinus tachycardia at 101, borderline diffuse ST elevation - Personally Reviewed  Telemetry   Sinus rhythm - Personally Reviewed  Radiology    Dg Chest 2 View  Result Date: 07/21/2018 CLINICAL DATA:  Back pain EXAM: CHEST - 2 VIEW COMPARISON:  07/20/2018 FINDINGS: Cardiac shadow is enlarged but stable. Increasing bibasilar atelectatic changes are seen. Small effusions are noted as well. These have increased in the interval from the prior exam. No focal confluent infiltrate is seen. Degenerative changes of the thoracic spine are noted. IMPRESSION: Increasing bibasilar atelectasis and small effusions. Electronically Signed   By: Inez Catalina M.D.   On: 07/21/2018 20:10   Ct Angio Chest/abd/pel For Dissection W And/or Wo Contrast  Result Date: 07/20/2018 CLINICAL DATA:  Upper back pain extending into the chest EXAM: CT ANGIOGRAPHY CHEST, ABDOMEN AND PELVIS TECHNIQUE: Multidetector CT imaging through the chest, abdomen and pelvis was performed using the standard protocol during bolus administration of intravenous contrast. Multiplanar reconstructed images and MIPs were obtained and reviewed to evaluate the vascular anatomy. CONTRAST:  156m ISOVUE-370 IOPAMIDOL (ISOVUE-370) INJECTION 76% COMPARISON:  Chest radiograph 11/02/2017 FINDINGS: CTA CHEST FINDINGS Cardiovascular: On the noncontrast images there is mild atherosclerotic calcification of the thoracic aorta but no findings of acute intramural hematoma. Following contrast administration we demonstrate no evidence of  aortic or branch vessel dissection. Mild cardiomegaly is present and there is a small pericardial effusion. Today's exam was not optimized specifically to assess for pulmonary embolus, but we do not demonstrate any large filling defect in the central pulmonary arteries. Mediastinum/Nodes: Unremarkable Lungs/Pleura: There is dependent atelectasis in both lower lobes. Musculoskeletal: Thoracic spondylosis. Review of the MIP images confirms the above findings. CTA ABDOMEN AND PELVIS FINDINGS VASCULAR Aorta: No significant atherosclerotic calcification of the abdominal aorta. No aneurysm or dissection. Celiac: Patent celiac trunk giving rise to the common hepatic and ultimately the proper hepatic artery. No splenic artery aneurysm. SMA: Patent and unremarkable. Renals: 2 left and single right renal arteries appear patent and unremarkable. IMA: Patent and unremarkable. Inflow: Unremarkable Veins: Incidental retroaortic left renal vein. Review of the MIP images confirms the above findings. NON-VASCULAR Hepatobiliary: Arterial phase appearance of the liver is unremarkable. The gallbladder is mildly prominent but without peritubal wall thickening. Pancreas: Unremarkable Spleen: Typical striated arterial phase enhancement pattern without specific splenic lesion identified. Adrenals/Urinary Tract: 10 mm hypodense lesion of the right kidney upper pole is likely a cyst but technically nonspecific due to small size the adrenal glands appear normal. Urinary bladder normal. Stomach/Bowel: Unremarkable Lymphatic: No lymphadenopathy identified. Reproductive: Uterus absent.  Adnexa unremarkable. Other: No supplemental non-categorized findings. Musculoskeletal: Mild grade  1 degenerative retrolisthesis at L3-4. Degenerative facet arthropathy at L5-S1. Review of the MIP images confirms the above findings. IMPRESSION: 1. No acute vascular findings. There is only minimal atherosclerotic calcification of the aortic arch. No dissection  observed. 2. Mild cardiomegaly.  Trace pericardial effusion. 3. Mild dependent atelectasis in both lower lobes. 4. 10 mm hypodense lesion of the right kidney upper pole is likely a cyst but technically nonspecific. Electronically Signed   By: Van Clines M.D.   On: 07/20/2018 17:17    Cardiac Studies   Echo pending  Assessment   1. Principal Problem: 2.   Pericarditis 3. Active Problems: 4.   Febrile illness 5.   Acute upper back pain 6.   Plan   1. 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 Autoimmune work-up pending. She is now willing to take colchicine. Will start 0.6 mg BID. Switch ibuprofen to Aspirin 650 mg TID. On protonix for GI protection. Would repeat limited echo in 2-3 days to evaluate for change in size of effusion (could possible be done as an outpatient). Add labs for am tomorrow - creatinine up on admit and potassium trending down. Leukocytosis stable, but noted to be febrile overnight to 101.4 - ?infection vs inflammation. Check ESR. Not on antibiotics - BCx pending.   Time Spent Directly with Patient:  I have spent a total of 25 minutes with the patient reviewing hospital notes, telemetry, EKGs, labs and examining the patient as well as establishing an assessment and plan that was discussed personally with the patient.  > 50% of time was spent in direct patient care.  Length of Stay:  LOS: 0 days   Pixie Casino, MD, [facility], [place] Director of the [facility] Lipid [facility] &  Cardiovascular [facility] Reduction [facility] Diplomate of the American Board of Clinical Lipidology Attending Cardiologist  Direct Dial: [phone]  Fax: [phone]  Website:  [email or web address] 07/22/2018, 10:15 AM

## 2018-07-22 NOTE — Plan of Care (Signed)
  Problem: Clinical Measurements: Troponin , CRP, WBC ,are elevated, suspected pericarditis. HR 77 ,BP 91/60 mmHg, SPO2 96% at room air, auscultated diminish breath sound at both basilar lobes , both mid and upper are clear. No distress at arrival. Goal: Cardiovascular complication will be avoided Outcome: Progressing: will continue to monitor Troponin q6 hr. Blood culture result is pending.   Problem: Pain Managment: No chest pain and back pain at arrival to [facility] [address] unit. Goal: General experience of comfort will improve Outcome: Progressing: rest comfortably with no chest pain.  Problem: Safety and education: admitted to [address] on cardiac telemetry monitoring. Goal: Ability to remain free from injury will improve Outcome: Progressing: call bell in reach, equipments instruction given, CHG bath given. Pt is low fall risk, alert and awake, oriented x 4.   Erica Oneill, BSN, RN, [facility]

## 2018-07-22 NOTE — Progress Notes (Addendum)
PROGRESS NOTE  Erica Oneill BJY:[id number] DOB: 07/29/1955 DOA: 07/21/2018 PCP: Hadley Penobbins, Robert A, MD  HPI/Recap of past 7124 hours: 63 year old female with medical history significant for hypertension who was seen on July 20, 2022 chest pain cardiology was consulted and he was discharged felt that she could follow-up as outpatient but [facility] the same day after discharge with continued chest pressure upper back pain and new onset of fever.  She is admitted for suspected pericarditis.  Cardiology was consulted and she was started on aspirin and colchicine  Subjective patient seen and examined at bedside.  Patient stated that the pain is starts from her back and goes through to the front.  She stated that the aspirin is really not helping  Assessment/Plan: Principal Problem:   Pericarditis Active Problems:   Febrile illness   Acute upper back pain  1.  Pericarditis of unknown etiology.  she was started on colchicine 1.2 mg twice a day then 0.6 mg twice a day from day 2 for 3 months.  She was tried on ibuprofen but it caused her blood pressure to go down so she does not take NSAIDs because of that but she says she takes Toradol which helps.  She was started on aspirin high-dose.  Which she said did not really help so I added Tylenol we will continue on her Protonix for GI prophylaxis we will continue to cycle her troponin..  I will check ANA and rheumatoid factor  2.  Febrile illness with leukocytosis.  Likely secondary to the pericarditis will monitor blood culture was obtained we will follow-up blood culture  Acute upper back pain this might be a combination of the pericarditis or other musculoskeletal strain or discogenic problem will continue her NSAIDs  4.  History of hypertension she had tried to maintain a healthy weight and she has lost 60 pounds and so she is no longer taking her antihypertensives.  Code Status: Full  Severity of Illness: The appropriate  patient status for this patient is OBSERVATION. Observation status is judged to be reasonable and necessary in order to provide the required intensity of service to ensure the patient's safety. The patient's presenting symptoms, physical exam findings, and initial radiographic and laboratory data in the context of their medical condition is felt to place them at decreased risk for further clinical deterioration. Furthermore, it is anticipated that the patient will be medically stable for discharge from the hospital within 2 midnights of admission. The following factors support the patient status of observation.   " The patient's presenting symptoms include chest pain and back pain. " The physical exam findings include chest pain. " The initial radiographic and laboratory data are pericarditis     Family Communication: Son at bedside named John  Disposition Plan: Home when stable   Consultants:  Cardiology  Procedures:  None  Antimicrobials:  None  DVT prophylaxis: Lovenox   Objective: Vitals:   07/22/18 0700 07/22/18 0843 07/22/18 0900 07/22/18 1100  BP:  92/61    Pulse: 77 85 87 (!) 50  Resp: (!) 28 (!) 21 (!) 22 (!) 22  Temp:  97.8 F (36.6 C)    TempSrc:  Oral    SpO2: 95% 99% 97% 94%  Weight:      Height:        Intake/Output Summary (Last 24 hours) at 07/22/2018 1337 Last data filed at 07/22/2018 1130 Gross per 24 hour  Intake 780 ml  Output 4 ml  Net 776 ml   Filed Weights   07/22/18 0414  Weight: 72.6 kg   Body mass index is 26.63 kg/m.  Exam:  . General: 63 y.o. year-old female well developed well nourished in no acute distress.  Alert and oriented x3. . Cardiovascular: Regular rate and rhythm with no rubs or gallops.  No thyromegaly or JVD noted.   Marland Kitchen Respiratory: Clear to auscultation with no wheezes or rales. Good inspiratory effort. . Abdomen: Soft, tender epigastric nondistended with normal bowel sounds x4 quadrants. . Musculoskeletal: No lower  extremity edema. 2/4 pulses in all 4 extremities.  Tenderness in the mid back . Skin: No ulcerative lesions noted or rashes, . Psychiatry: Mood is appropriate for condition and setting    Data Reviewed: CBC: Recent Labs  Lab 07/20/18 1356 07/21/18 1827 07/22/18 0351  WBC 14.0* 20.3* 20.1*  NEUTROABS 11.1* 17.0*  --   HGB 13.5 12.8 12.1  HCT 43.3 41.5 37.6  MCV 98.4 100.7* 98.2  PLT 172 132* 153   Basic Metabolic Panel: Recent Labs  Lab 07/20/18 1356 07/21/18 1827 07/22/18 0351  NA 136 134* 136  K 3.5 4.4 3.6  CL 101 106 105  CO2 25 18* 20*  GLUCOSE 128* 132* 102*  BUN 12 9 14   CREATININE 0.78 0.78 1.16*  CALCIUM 8.8* 8.5* 8.7*   GFR: Estimated Creatinine Clearance: 48.9 mL/min (A) (by C-G formula based on SCr of 1.16 mg/dL (H)). Liver Function Tests: Recent Labs  Lab 07/20/18 1356 07/21/18 1827  AST 18 22  ALT 16 11  ALKPHOS 64 60  BILITOT 1.2 2.0*  PROT 6.5 6.2*  ALBUMIN 3.4* 2.8*   No results for input(s): LIPASE, AMYLASE in the last 168 hours. No results for input(s): AMMONIA in the last 168 hours. Coagulation Profile: No results for input(s): INR, PROTIME in the last 168 hours. Cardiac Enzymes: Recent Labs  Lab 07/20/18 2145 07/21/18 0129 07/21/18 1827 07/21/18 2152 07/22/18 0351  TROPONINI <0.03 <0.03 0.08* 0.05* 0.06*   BNP (last 3 results) No results for input(s): PROBNP in the last 8760 hours. HbA1C: No results for input(s): HGBA1C in the last 72 hours. CBG: No results for input(s): GLUCAP in the last 168 hours. Lipid Profile: No results for input(s): CHOL, HDL, LDLCALC, TRIG, CHOLHDL, LDLDIRECT in the last 72 hours. Thyroid Function Tests: No results for input(s): TSH, T4TOTAL, FREET4, T3FREE, THYROIDAB in the last 72 hours. Anemia Panel: No results for input(s): VITAMINB12, FOLATE, FERRITIN, TIBC, IRON, RETICCTPCT in the last 72 hours. Urine analysis:    Component Value Date/Time   COLORURINE YELLOW 07/20/2018 1441    APPEARANCEUR HAZY (A) 07/20/2018 1441   LABSPEC 1.009 07/20/2018 1441   PHURINE 6.0 07/20/2018 1441   GLUCOSEU NEGATIVE 07/20/2018 1441   HGBUR NEGATIVE 07/20/2018 1441   BILIRUBINUR NEGATIVE 07/20/2018 1441   KETONESUR NEGATIVE 07/20/2018 1441   PROTEINUR NEGATIVE 07/20/2018 1441   NITRITE NEGATIVE 07/20/2018 1441   LEUKOCYTESUR SMALL (A) 07/20/2018 1441   Sepsis Labs: @LABRCNTIP (procalcitonin:4,lacticidven:4)  )No results found for this or any previous visit (from the past 240 hour(s)).    Studies: Dg Chest 2 View  Result Date: 07/21/2018 CLINICAL DATA:  Back pain EXAM: CHEST - 2 VIEW COMPARISON:  07/20/2018 FINDINGS: Cardiac shadow is enlarged but stable. Increasing bibasilar atelectatic changes are seen. Small effusions are noted as well. These have increased in the interval from the prior exam. No focal confluent infiltrate is seen. Degenerative changes of the thoracic spine are noted. IMPRESSION: Increasing bibasilar atelectasis and  small effusions. Electronically Signed   By: Alcide Clever M.D.   On: 07/21/2018 20:10    Scheduled Meds: . aspirin  650 mg Oral TID  . colchicine  0.6 mg Oral BID  . enoxaparin (LOVENOX) injection  40 mg Subcutaneous Q24H  . pantoprazole  40 mg Oral Daily  . sodium chloride flush  3 mL Intravenous Q12H    Continuous Infusions:   LOS: 0 days     Myrtie Neither, MD [facility]  To reach me or the doctor on call, go to: [email or web address]  07/22/2018, 1:37 PM

## 2018-07-22 NOTE — Care Management (Signed)
#    3.   S/W MAY G. @ [employer] [place] RX # [id number] [phone]   COLCHICINE  0.6 MG BID COVER- NONE FORMULARY PRIOR APPROVAL- - YES  # [phone] FOR ONE TIME EXEMPTION   PREFERRED PHARMACY : YES-  [facility]

## 2018-07-23 DIAGNOSIS — I300 Acute nonspecific idiopathic pericarditis: Principal | ICD-10-CM

## 2018-07-23 DIAGNOSIS — M549 Dorsalgia, unspecified: Secondary | ICD-10-CM

## 2018-07-23 LAB — BASIC METABOLIC PANEL
Anion gap: 9 (ref 5–15)
BUN: 17 mg/dL (ref 8–23)
CO2: 23 mmol/L (ref 22–32)
Calcium: 8.5 mg/dL — ABNORMAL LOW (ref 8.9–10.3)
Chloride: 104 mmol/L (ref 98–111)
Creatinine, Ser: 1.11 mg/dL — ABNORMAL HIGH (ref 0.44–1.00)
GFR calc Af Amer: >60 mL/min (ref >60)
GFR calc non Af Amer: 52 mL/min — ABNORMAL LOW (ref >60)
Glucose, Bld: 104 mg/dL — ABNORMAL HIGH (ref 70–99)
Potassium: 3.8 mmol/L (ref 3.5–5.1)
Sodium: 136 mmol/L (ref 135–145)

## 2018-07-23 LAB — CBC
HCT: 34.2 % — ABNORMAL LOW (ref 36.0–46.0)
Hemoglobin: 10.9 g/dL — ABNORMAL LOW (ref 12.0–15.0)
MCH: 31.3 pg (ref 26.0–34.0)
MCHC: 31.9 g/dL (ref 30.0–36.0)
MCV: 98.3 fL (ref 80.0–100.0)
Platelets: 155 10*3/uL (ref 150–400)
RBC: 3.48 MIL/uL — ABNORMAL LOW (ref 3.87–5.11)
RDW: 12.5 % (ref 11.5–15.5)
WBC: 13.2 10*3/uL — ABNORMAL HIGH (ref 4.0–10.5)
nRBC: 0 % (ref 0.0–0.2)

## 2018-07-23 MED ORDER — COLCHICINE 0.6 MG PO TABS: 0.6000 mg | ORAL_TABLET | Freq: Two times a day (BID) | ORAL | 2 refills | Status: AC

## 2018-07-23 MED ORDER — ASPIRIN 325 MG PO TABS: 650.0000 mg | ORAL_TABLET | Freq: Three times a day (TID) | ORAL | 0 refills | Status: AC

## 2018-07-23 MED ORDER — PANTOPRAZOLE SODIUM 40 MG PO TBEC: 40.0000 mg | DELAYED_RELEASE_TABLET | Freq: Every day | ORAL | 2 refills | Status: DC

## 2018-07-23 NOTE — Discharge Summary (Signed)
Physician Discharge Summary  EDAN SERRATORE PJA:[id number] DOB: [birth year] DOA: 07/21/2018  PCP: Myrlene Broker, MD  Admit date: 07/21/2018 Discharge date: 07/23/2018  Admitted From: Home Disposition: Home  Recommendations for Outpatient Follow-up:  1. Follow up with PCP in 1-2 weeks 2. Please obtain BMP/CBC in one week 3. Please follow up on the following pending results: ANA, rheumatoid factor  Home Health: No Equipment/Devices: None  Discharge Condition: Stable CODE STATUS: Full code Diet recommendation: Heart healthy  Brief/Interim Summary:  Erica Oneill is a 63 year old female with medical history significant for hypertension who was seen on July 20, 2022 chest pain cardiology was consulted and he was discharged felt that she could follow-up as outpatient but she returns today [facility] ED on the same day after discharge with continued chest pressure upper back pain and new onset of fever.  She is admitted for suspected pericarditis.   Acute pericarditis of unknown etiology.   Patient return to the ED with complaints of persistent chest discomfort.  Noted to have leukocytosis with increased inflammatory markers of ESR and CRP.  She was started on colchicine and high-dose aspirin.  Her chest pain remarkably improved with improvement of her leukocytosis and pain symptoms.  She is unable to tolerate NSAIDs due to reported hypotension.  ANA and  rheumatoid factor were drawn and pending at time of discharge.  She will continue colchicine 0.6 mg BID for 3 months and aspirin 650 mg p.o. 3 times daily until resolution of symptoms and then will taper down over a 2-4-week period.  Patient was also started on Protonix 40 mg p.o. daily for GI protection.  Febrile illness with leukocytosis.   Likely secondary to the pericarditis.  Blood cultures remain negative at time of discharge.  History of hypertension  she had tried to maintain a healthy weight and she has lost 60 pounds and so  she is no longer taking her antihypertensives.  Discharge Diagnoses:  Principal Problem:   Pericarditis    Discharge Instructions  Discharge Instructions    Call MD for:  difficulty breathing, headache or visual disturbances   Complete by:  As directed    Call MD for:  persistant dizziness or light-headedness   Complete by:  As directed    Call MD for:  persistant nausea and vomiting   Complete by:  As directed    Call MD for:  severe uncontrolled pain   Complete by:  As directed    Call MD for:  temperature >100.4   Complete by:  As directed    Diet - low sodium heart healthy   Complete by:  As directed    Discharge instructions   Complete by:  As directed    F/U PCP in 7-10 days following hospitalization for acute pericarditis.  Follow-up rheumatoid factor and ANA that was not back at time of discharge.  Follow-up with cardiology, Dr. Debara Pickett in 1 month with repeat echocardiogram and ESR/CRP.   Increase activity slowly   Complete by:  As directed      Allergies as of 07/23/2018      Reactions   Lidocaine Anaphylaxis   Rocephin [ceftriaxone] Anaphylaxis   Other Rash, Hypertension   seafood      Medication List    STOP taking these medications   ketorolac 10 MG tablet Commonly known as:  TORADOL     TAKE these medications   aspirin 325 MG tablet Take 2 tablets (650 mg total) by mouth 3 (three) times daily for  30 days.   colchicine 0.6 MG tablet Take 1 tablet (0.6 mg total) by mouth 2 (two) times daily.   pantoprazole 40 MG tablet Commonly known as:  PROTONIX Take 1 tablet (40 mg total) by mouth daily. Start taking on:  July 24, 2018      Follow-up Information    Hilty, Kenneth C, MD Follow up in 1 month(s).   Specialty:  Cardiology Why:  f/u pericarditis Contact information: [address] [place] [address] [phone]        Robbins, Robert A, MD Follow up in 1 week(s).   Specialty:  Family Medicine Why:   pericarditis  Contact information: [address] [place] [address] [phone]          Allergies  Allergen Reactions  . Lidocaine Anaphylaxis  . Rocephin [Ceftriaxone] Anaphylaxis  . Other Rash and Hypertension    seafood    Consultations:  Kenneth Hilty, [facility]   Procedures/Studies: Dg Chest 2 View  Result Date: 07/21/2018 CLINICAL DATA:  Back pain EXAM: CHEST - 2 VIEW COMPARISON:  07/20/2018 FINDINGS: Cardiac shadow is enlarged but stable. Increasing bibasilar atelectatic changes are seen. Small effusions are noted as well. These have increased in the interval from the prior exam. No focal confluent infiltrate is seen. Degenerative changes of the thoracic spine are noted. IMPRESSION: Increasing bibasilar atelectasis and small effusions. Electronically Signed   By: Mark  Lukens M.D.   On: 07/21/2018 20:10   Ct Angio Chest/abd/pel For Dissection W And/or Wo Contrast  Result Date: 07/20/2018 CLINICAL DATA:  Upper back pain extending into the chest EXAM: CT ANGIOGRAPHY CHEST, ABDOMEN AND PELVIS TECHNIQUE: Multidetector CT imaging through the chest, abdomen and pelvis was performed using the standard protocol during bolus administration of intravenous contrast. Multiplanar reconstructed images and MIPs were obtained and reviewed to evaluate the vascular anatomy. CONTRAST:  100mL ISOVUE-370 IOPAMIDOL (ISOVUE-370) INJECTION 76% COMPARISON:  Chest radiograph 11/02/2017 FINDINGS: CTA CHEST FINDINGS Cardiovascular: On the noncontrast images there is mild atherosclerotic calcification of the thoracic aorta but no findings of acute intramural hematoma. Following contrast administration we demonstrate no evidence of aortic or branch vessel dissection. Mild cardiomegaly is present and there is a small pericardial effusion. Today's exam was not optimized specifically to assess for pulmonary embolus, but we do not demonstrate any large filling defect in the central pulmonary  arteries. Mediastinum/Nodes: Unremarkable Lungs/Pleura: There is dependent atelectasis in both lower lobes. Musculoskeletal: Thoracic spondylosis. Review of the MIP images confirms the above findings. CTA ABDOMEN AND PELVIS FINDINGS VASCULAR Aorta: No significant atherosclerotic calcification of the abdominal aorta. No aneurysm or dissection. Celiac: Patent celiac trunk giving rise to the common hepatic and ultimately the proper hepatic artery. No splenic artery aneurysm. SMA: Patent and unremarkable. Renals: 2 left and single right renal arteries appear patent and unremarkable. IMA: Patent and unremarkable. Inflow: Unremarkable Veins: Incidental retroaortic left renal vein. Review of the MIP images confirms the above findings. NON-VASCULAR Hepatobiliary: Arterial phase appearance of the liver is unremarkable. The gallbladder is mildly prominent but without peritubal wall thickening. Pancreas: Unremarkable Spleen: Typical striated arterial phase enhancement pattern without specific splenic lesion identified. Adrenals/Urinary Tract: 10 mm hypodense lesion of the right kidney upper pole is likely a cyst but technically nonspecific due to small size the adrenal glands appear normal. Urinary bladder normal. Stomach/Bowel: Unremarkable Lymphatic: No lymphadenopathy identified. Reproductive: Uterus absent.  Adnexa unremarkable. Other: No supplemental non-categorized findings. Musculoskeletal: Mild grade 1 degenerative retrolisthesis at L3-4.   Degenerative facet arthropathy at L5-S1. Review of the MIP images confirms the above findings. IMPRESSION: 1. No acute vascular findings. There is only minimal atherosclerotic calcification of the aortic arch. No dissection observed. 2. Mild cardiomegaly.  Trace pericardial effusion. 3. Mild dependent atelectasis in both lower lobes. 4. 10 mm hypodense lesion of the right kidney upper pole is likely a cyst but technically nonspecific. Electronically Signed   By: Van Clines  M.D.   On: 07/20/2018 17:17    (Echo, Carotid, EGD, Colonoscopy, ERCP)    Subjective:   Discharge Exam: Vitals:   07/22/18 2000 07/22/18 2151  BP: (!) 97/57 93/60  Pulse: (!) 103 94  Resp: (!) 29 (!) 25  Temp: (!) 100.5 F (38.1 C) 99 F (37.2 C)  SpO2: 95% 95%   Vitals:   07/22/18 1100 07/22/18 1700 07/22/18 2000 07/22/18 2151  BP:  (!) 99/59 (!) 97/57 93/60  Pulse: (!) 50 (!) 103 (!) 103 94  Resp: (!) 22 (!) 30 (!) 29 (!) 25  Temp:  99.7 F (37.6 C) (!) 100.5 F (38.1 C) 99 F (37.2 C)  TempSrc:  Oral  Oral  SpO2: 94% 96% 95% 95%  Weight:      Height:        General: Pt is alert, awake, not in acute distress Cardiovascular: RRR, S1/S2 +, no rubs, no gallops Respiratory: CTA bilaterally, no wheezing, no rhonchi Abdominal: Soft, NT, ND, bowel sounds + Extremities: no edema, no cyanosis    The results of significant diagnostics from this hospitalization (including imaging, microbiology, ancillary and laboratory) are listed below for reference.     Microbiology: Recent Results (from the past 240 hour(s))  Blood culture (routine x 2)     Status: None (Preliminary result)   Collection Time: 07/21/18  9:50 PM  Result Value Ref Range Status   Specimen Description BLOOD RIGHT ANTECUBITAL  Final   Special Requests   Final    BOTTLES DRAWN AEROBIC AND ANAEROBIC Blood Culture adequate volume   Culture   Final    NO GROWTH 2 DAYS Performed at [facility], [address] [place], [place] [address]    Report Status PENDING  Incomplete  Blood culture (routine x 2)     Status: None (Preliminary result)   Collection Time: 07/21/18 10:02 PM  Result Value Ref Range Status   Specimen Description BLOOD LEFT HAND  Final   Special Requests   Final    BOTTLES DRAWN AEROBIC AND ANAEROBIC Blood Culture adequate volume   Culture   Final    NO GROWTH 2 DAYS Performed at [facility], [place] [address], [place], [place] [address]    Report Status PENDING   Incomplete     Labs: BNP (last 3 results) No results for input(s): BNP in the last 8760 hours. Basic Metabolic Panel: Recent Labs  Lab 07/20/18 1356 07/21/18 1827 07/22/18 0351 07/23/18 0310  NA 136 134* 136 136  K 3.5 4.4 3.6 3.8  CL 101 106 105 104  CO2 25 18* 20* 23  GLUCOSE 128* 132* 102* 104*  BUN _0 CREATININE 0.78 0.78 1.16* 1.11*  CALCIUM 8.8* 8.5* 8.7* 8.5*   Liver Function Tests: Recent Labs  Lab 07/20/18 1356 07/21/18 1827  AST 18 22  ALT 16 11  ALKPHOS 64 60  BILITOT 1.2 2.0*  PROT 6.5 6.2*  ALBUMIN 3.4* 2.8*   No results for input(s): LIPASE, AMYLASE in the last 168 hours.  No results for input(s): AMMONIA in the last 168 hours. CBC: Recent Labs  Lab 07/20/18 1356 07/21/18 1827 07/22/18 0351 07/23/18 0310  WBC 14.0* 20.3* 20.1* 13.2*  NEUTROABS 11.1* 17.0*  --   --   HGB 13.5 12.8 12.1 10.9*  HCT 43.3 41.5 37.6 34.2*  MCV 98.4 100.7* 98.2 98.3  PLT 172 132* 153 155   Cardiac Enzymes: Recent Labs  Lab 07/20/18 2145 07/21/18 0129 07/21/18 1827 07/21/18 2152 07/22/18 0351  TROPONINI <0.03 <0.03 0.08* 0.05* 0.06*   BNP: Invalid input(s): POCBNP CBG: No results for input(s): GLUCAP in the last 168 hours. D-Dimer No results for input(s): DDIMER in the last 72 hours. Hgb A1c No results for input(s): HGBA1C in the last 72 hours. Lipid Profile No results for input(s): CHOL, HDL, LDLCALC, TRIG, CHOLHDL, LDLDIRECT in the last 72 hours. Thyroid function studies No results for input(s): TSH, T4TOTAL, T3FREE, THYROIDAB in the last 72 hours.  Invalid input(s): FREET3 Anemia work up No results for input(s): VITAMINB12, FOLATE, FERRITIN, TIBC, IRON, RETICCTPCT in the last 72 hours. Urinalysis    Component Value Date/Time   COLORURINE AMBER (A) 07/22/2018 1944   APPEARANCEUR CLEAR 07/22/2018 1944   LABSPEC 1.025 07/22/2018 1944   PHURINE 5.0 07/22/2018 1944   GLUCOSEU NEGATIVE 07/22/2018 1944   HGBUR NEGATIVE 07/22/2018 1944    BILIRUBINUR NEGATIVE 07/22/2018 1944   KETONESUR NEGATIVE 07/22/2018 1944   PROTEINUR 30 (A) 07/22/2018 1944   NITRITE NEGATIVE 07/22/2018 1944   LEUKOCYTESUR SMALL (A) 07/22/2018 1944   Sepsis Labs Invalid input(s): PROCALCITONIN,  WBC,  LACTICIDVEN Microbiology Recent Results (from the past 240 hour(s))  Blood culture (routine x 2)     Status: None (Preliminary result)   Collection Time: 07/21/18  9:50 PM  Result Value Ref Range Status   Specimen Description BLOOD RIGHT ANTECUBITAL  Final   Special Requests   Final    BOTTLES DRAWN AEROBIC AND ANAEROBIC Blood Culture adequate volume   Culture   Final    NO GROWTH 2 DAYS Performed at [facility], [address] [place], [place] [address]    Report Status PENDING  Incomplete  Blood culture (routine x 2)     Status: None (Preliminary result)   Collection Time: 07/21/18 10:02 PM  Result Value Ref Range Status   Specimen Description BLOOD LEFT HAND  Final   Special Requests   Final    BOTTLES DRAWN AEROBIC AND ANAEROBIC Blood Culture adequate volume   Culture   Final    NO GROWTH 2 DAYS Performed at [facility], [address] ,  [address]    Report Status PENDING  Incomplete     Time coordinating discharge: Over 30 minutes  SIGNED:   Eric J Austria, MD  [facility] 07/23/2018, 11:37 AM Pager   If 7PM-7AM, please contact night-coverage [email or web address] Password [email or web address]  

## 2018-07-23 NOTE — Discharge Instructions (Signed)
Pericarditis ° °Pericarditis is inflammation of the pericardium. The pericardium is a thin, double-layered, fluid-filled sac that surrounds your heart. The pericardium protects and holds your heart in your chest cavity. °Inflammation of your pericardium can cause rubbing (friction) between the two layers when your heart beats. Fluid may also build up between the layers of the sac (pericardial effusion). °There are three types of this condition: °· Acute pericarditis. Inflammation develops suddenly and causes pericardial effusion. °· Chronic pericarditis. Inflammation may develop slowly, or it may continue after acute pericarditis and last longer than 6 months. °· Constrictive pericarditis (rare). The layers of the pericardium stiffen and develop scar tissue. The scar tissue thickens and sticks together. This makes it difficult for the heart to work in a normal way. °In most cases, pericarditis is acute and not serious. Chronic pericarditis and constrictive pericarditis are more serious and may require treatment. °What are the causes? °In most cases, the cause of this condition is not known. If a cause is found, it may be: °· An infection from a virus. °· A heart attack (myocardial infarction). °· Problems after open-heart surgery. °· Chest injury. °· Autoimmune disorder. The body's defense system (immune system) attacks healthy tissues. Examples include lupus or rheumatoid arthritis. °· Kidney failure. °· Underactive thyroid gland (hypothyroidism). °· Cancer that has spread (metastasized) to the pericardium from another part of the body. °· Treatment using high-energy X-rays (radiation). °· Certain medicines, including some seizure medicines, blood thinners, heart medicines, and antibiotics. °· Infection from a fungus or bacteria (rare). °What increases the risk? °The following factors may make you more likely to develop this condition: °· Being female. °· Being 20-50 years old. °· Having had pericarditis  before. °· Having had a recent upper respiratory tract infection. °What are the signs or symptoms? °The main symptom of this condition is chest pain. The chest pain may: °· Be in the center or the left side of your chest. °· Not go away with rest. °· Last for many hours or days. °· Worsen when you lie down and get better when you sit up and lean forward. °· Worsen when you swallow. °· Move to your back, neck, or shoulder. °Other symptoms may include: °· A chronic, dry cough. °· Heart palpitations. These may feel like rapid, fluttering, or pounding heartbeats. °· Dizziness or fainting. °· Tiredness or fatigue. °· Fever. °· Rapid breathing. °· Shortness of breath when lying down. °How is this diagnosed? °This condition is diagnosed based on medical history, physical exam, and diagnostic tests. During your physical exam, your health care provider will listen for friction while your heart beats (pericardial rub). You may also have tests, including: °· Blood tests to look for signs of infection and inflammation. °· Electrocardiogram (ECG). This test measures the electrical activity in your heart. °· Echocardiogram. This uses sound waves to make images of your heart. °· CT scan. °· MRI. °· Culture of pericardial fluid. °· Removing a tissue sample of the pericardium to look at under a microscope (biopsy). °If tests show that you may have constrictive pericarditis, a small, thin tube may be inserted into your heart to confirm the diagnosis (cardiac catheterization). °How is this treated? °Treatment for this condition depends on the cause and type of pericarditis that you have. In most cases, acute pericarditis will clear up on its own. Treatment may include: °· Limiting physical activity. °· Medicines, such as: °? NSAIDs to relieve pain and inflammation. °? Steroids to reduce inflammation. °? Colchicine to relieve pain   and inflammation. °· A procedure to remove fluid using a needle (pericardiocentesis) if fluid buildup puts  pressure on the heart. °· Surgery to remove part of the pericardium if constrictive pericarditis develops. °If another condition is causing your pericarditis, you may also need treatment for that condition. °Follow these instructions at home: °· Limit your activity as told by your health care provider until your symptoms improve. This usually includes: °? Resting or sitting for most of the day. °? No long walks. °? No exercise. °? No sports. °· Athletes may need to limit competition for several months. °· Do not use any products that contain nicotine or tobacco, such as cigarettes and e-cigarettes. If you need help quitting, ask your health care provider. °· Eat a heart-healthy diet. Ask your dietitian what foods are healthy for your heart. °· Take over-the-counter and prescription medicines only as told by your health care provider. °? If you were prescribed an antibiotic medicine, take it as told by your health care provider. Do not stop taking the antibiotic even if you start to feel better. °· Keep all follow-up visits as told by your health care provider. This is important. °Contact a health care provider if: °· You continue to have symptoms of pericarditis. °· You develop new symptoms of pericarditis. °· Your symptoms get worse. °· You have a fever. °Get help right away if: °· You have worsening chest pain.  °· You have difficulty breathing. °· You pass out. °These symptoms may represent a serious problem that is an emergency. Do not wait to see if the symptoms will go away. Get medical help right away. Call your local emergency services (911 in the [place] Do not drive yourself to the hospital. °Summary °· Pericarditis is inflammation of the pericardium. °· The pericardium is a thin, double-layered, fluid-filled sac that surrounds your heart. °· The main symptom of this condition is chest pain. °· Treatment for this condition depends on the cause and type of pericarditis that you have. °This information is not  intended to replace advice given to you by your health care provider. Make sure you discuss any questions you have with your health care provider. °Document Released: 12/06/2000 Document Revised: 07/19/2017 Document Reviewed: 07/19/2017 °[employer]vier Interactive Patient Education © 2019 [employer] ° °

## 2018-07-23 NOTE — Progress Notes (Signed)
DAILY PROGRESS NOTE   Patient Name: Erica Oneill Date of Encounter: 07/23/2018 Cardiologist: No primary care provider on file.  Chief Complaint   Feels better today - pain is much improved.  Patient Profile   KATHA KUEHNE is a 63 y.o. female with a hx of HTN who was seen for chest discomfort and syncope at the request of [address] The patient was felt to have atypical pain for ACS and discharged, however was readmitted the same day with recurrent chest pain, mildly elevated troponins and elevated CRP concerning for pericarditis.  Subjective   Pain improved today - leukocytosis is improving. ESR and CRP are up, I believe confirming the diagnosis of pericarditis.  Objective   Vitals:   07/22/18 1100 07/22/18 1700 07/22/18 2000 07/22/18 2151  BP:  (!) 99/59 (!) 97/57 93/60  Pulse: (!) 50 (!) 103 (!) 103 94  Resp: (!) 22 (!) 30 (!) 29 (!) 25  Temp:  99.7 F (37.6 C) (!) 100.5 F (38.1 C) 99 F (37.2 C)  TempSrc:  Oral  Oral  SpO2: 94% 96% 95% 95%  Weight:      Height:        Intake/Output Summary (Last 24 hours) at 07/23/2018 7681 Last data filed at 07/22/2018 1130 Gross per 24 hour  Intake 240 ml  Output 4 ml  Net 236 ml   Filed Weights   07/22/18 0414  Weight: 72.6 kg    Physical Exam   General appearance: alert and no distress Lungs: clear to auscultation bilaterally Heart: regular rate and rhythm, S1, S2 normal and no rub Extremities: extremities normal, atraumatic, no cyanosis or edema Neurologic: Grossly normal  Inpatient Medications    Scheduled Meds: . aspirin  650 mg Oral TID  . colchicine  0.6 mg Oral BID  . enoxaparin (LOVENOX) injection  40 mg Subcutaneous Q24H  . pantoprazole  40 mg Oral Daily  . sodium chloride flush  3 mL Intravenous Q12H    Continuous Infusions:   PRN Meds: acetaminophen **OR** acetaminophen, ondansetron **OR** ondansetron (ZOFRAN) IV   Labs   Results for orders placed or performed during the  hospital encounter of 07/21/18 (from the past 48 hour(s))  CBC with Differential     Status: Abnormal   Collection Time: 07/21/18  6:27 PM  Result Value Ref Range   WBC 20.3 (H) 4.0 - 10.5 K/uL   RBC 4.12 3.87 - 5.11 MIL/uL   Hemoglobin 12.8 12.0 - 15.0 g/dL   HCT 41.5 36.0 - 46.0 %   MCV 100.7 (H) 80.0 - 100.0 fL   MCH 31.1 26.0 - 34.0 pg   MCHC 30.8 30.0 - 36.0 g/dL   RDW 12.2 11.5 - 15.5 %   Platelets 132 (L) 150 - 400 K/uL   nRBC 0.0 0.0 - 0.2 %   Neutrophils Relative % 84 %   Neutro Abs 17.0 (H) 1.7 - 7.7 K/uL   Lymphocytes Relative 4 %   Lymphs Abs 0.9 0.7 - 4.0 K/uL   Monocytes Relative 11 %   Monocytes Absolute 2.2 (H) 0.1 - 1.0 K/uL   Eosinophils Relative 0 %   Eosinophils Absolute 0.1 0.0 - 0.5 K/uL   Basophils Relative 0 %   Basophils Absolute 0.1 0.0 - 0.1 K/uL   Immature Granulocytes 1 %   Abs Immature Granulocytes 0.13 (H) 0.00 - 0.07 K/uL    Comment: Performed at [facility], [address], [place], [place] [address]  Troponin I -  ONCE - STAT     Status: Abnormal   Collection Time: 07/21/18  6:27 PM  Result Value Ref Range   Troponin I 0.08 (HH) <0.03 ng/mL    Comment: CRITICAL RESULT CALLED TO, READ BACK BY AND VERIFIED WITH: K.WARD,RN @ 1953 07/21/2018 [place] Performed at [facility], [place] [address], [place], [place] [address]   Comprehensive metabolic panel     Status: Abnormal   Collection Time: 07/21/18  6:27 PM  Result Value Ref Range   Sodium 134 (L) 135 - 145 mmol/L   Potassium 4.4 3.5 - 5.1 mmol/L   Chloride 106 98 - 111 mmol/L   CO2 18 (L) 22 - 32 mmol/L   Glucose, Bld 132 (H) 70 - 99 mg/dL   BUN 9 8 - 23 mg/dL   Creatinine, Ser 0.78 0.44 - 1.00 mg/dL   Calcium 8.5 (L) 8.9 - 10.3 mg/dL   Total Protein 6.2 (L) 6.5 - 8.1 g/dL   Albumin 2.8 (L) 3.5 - 5.0 g/dL   AST 22 15 - 41 U/L   ALT 11 0 - 44 U/L   Alkaline Phosphatase 60 38 - 126 U/L   Total Bilirubin 2.0 (H) 0.3 - 1.2 mg/dL   GFR calc non Af Amer >60 >60 mL/min   GFR calc  Af Amer >60 >60 mL/min   Anion gap 10 5 - 15    Comment: Performed at [facility], [place] [address] [place], [place] [address]  C-reactive protein     Status: Abnormal   Collection Time: 07/21/18  9:47 PM  Result Value Ref Range   CRP 22.7 (H) <1.0 mg/dL    Comment: Performed at [facility], [place] [address], [place], [place] [address]  Blood culture (routine x 2)     Status: None (Preliminary result)   Collection Time: 07/21/18  9:50 PM  Result Value Ref Range   Specimen Description BLOOD RIGHT ANTECUBITAL    Special Requests      BOTTLES DRAWN AEROBIC AND ANAEROBIC Blood Culture adequate volume   Culture      NO GROWTH < 24 HOURS Performed at [facility], [place] [address] [place], [place] [address]    Report Status PENDING   Troponin I -     Status: Abnormal   Collection Time: 07/21/18  9:52 PM  Result Value Ref Range   Troponin I 0.05 (HH) <0.03 ng/mL    Comment: CRITICAL VALUE NOTED.  VALUE IS CONSISTENT WITH PREVIOUSLY REPORTED AND CALLED VALUE. Performed at [facility], [place] [address], [place], [place] [address]   Blood culture (routine x 2)     Status: None (Preliminary result)   Collection Time: 07/21/18 10:02 PM  Result Value Ref Range   Specimen Description BLOOD LEFT HAND    Special Requests      BOTTLES DRAWN AEROBIC AND ANAEROBIC Blood Culture adequate volume   Culture      NO GROWTH < 24 HOURS Performed at [facility], [place] [address], [place], [place] [address]    Report Status PENDING   Troponin I - Now Then Q6H     Status: Abnormal   Collection Time: 07/22/18  3:51 AM  Result Value Ref Range   Troponin I 0.06 (HH) <0.03 ng/mL    Comment: CRITICAL VALUE NOTED.  VALUE IS CONSISTENT WITH PREVIOUSLY REPORTED AND CALLED VALUE. Performed at [facility], [place] [address], [place], [place] [address]  Basic metabolic panel     Status: Abnormal   Collection Time: 07/22/18  3:51 AM  Result Value Ref Range   Sodium 136 135 - 145  mmol/L   Potassium 3.6 3.5 - 5.1 mmol/L    Comment: NO VISIBLE HEMOLYSIS   Chloride 105 98 - 111 mmol/L   CO2 20 (L) 22 - 32 mmol/L   Glucose, Bld 102 (H) 70 - 99 mg/dL   BUN 14 8 - 23 mg/dL   Creatinine, Ser 1.16 (H) 0.44 - 1.00 mg/dL   Calcium 8.7 (L) 8.9 - 10.3 mg/dL   GFR calc non Af Amer 50 (L) >60 mL/min   GFR calc Af Amer 58 (L) >60 mL/min   Anion gap 11 5 - 15    Comment: Performed at [facility], [place] [address] [place], [place] [address]  CBC     Status: Abnormal   Collection Time: 07/22/18  3:51 AM  Result Value Ref Range   WBC 20.1 (H) 4.0 - 10.5 K/uL   RBC 3.83 (L) 3.87 - 5.11 MIL/uL   Hemoglobin 12.1 12.0 - 15.0 g/dL   HCT 37.6 36.0 - 46.0 %   MCV 98.2 80.0 - 100.0 fL   MCH 31.6 26.0 - 34.0 pg   MCHC 32.2 30.0 - 36.0 g/dL   RDW 12.5 11.5 - 15.5 %   Platelets 153 150 - 400 K/uL   nRBC 0.0 0.0 - 0.2 %    Comment: Performed at [facility], [place] [address] [place], [place] [address]  Sedimentation rate     Status: Abnormal   Collection Time: 07/22/18  3:09 PM  Result Value Ref Range   Sed Rate 65 (H) 0 - 22 mm/hr    Comment: Performed at [facility] [address] [place], [place] [address]  Urinalysis, Routine w reflex microscopic     Status: Abnormal   Collection Time: 07/22/18  7:44 PM  Result Value Ref Range   Color, Urine AMBER (A) YELLOW    Comment: BIOCHEMICALS MAY BE AFFECTED BY COLOR   APPearance CLEAR CLEAR   Specific Gravity, Urine 1.025 1.005 - 1.030   pH 5.0 5.0 - 8.0   Glucose, UA NEGATIVE NEGATIVE mg/dL   Hgb urine dipstick NEGATIVE NEGATIVE   Bilirubin Urine NEGATIVE NEGATIVE   Ketones, ur NEGATIVE NEGATIVE mg/dL   Protein, ur 30 (A) NEGATIVE mg/dL   Nitrite NEGATIVE NEGATIVE   Leukocytes, UA SMALL (A) NEGATIVE   RBC / HPF 0-5 0 - 5 RBC/hpf   WBC, UA 6-10 0 - 5 WBC/hpf   Bacteria, UA NONE SEEN NONE SEEN   Squamous Epithelial / LPF 0-5 0 - 5   Mucus PRESENT    Hyaline Casts, UA PRESENT     Comment: Performed at [facility], [address] [place], [place] [address]  CBC     Status: Abnormal   Collection Time: 07/23/18  3:10 AM  Result Value Ref Range   WBC 13.2 (H) 4.0 - 10.5 K/uL   RBC 3.48 (L) 3.87 - 5.11 MIL/uL   Hemoglobin 10.9 (L) 12.0 - 15.0 g/dL   HCT 34.2 (L) 36.0 - 46.0 %   MCV 98.3 80.0 - 100.0 fL   MCH 31.3 26.0 - 34.0 pg   MCHC 31.9 30.0 - 36.0 g/dL   RDW 12.5 11.5 - 15.5 %   Platelets 155 150 - 400 K/uL   nRBC 0.0 0.0 - 0.2 %  Comment: Performed at [facility], [place] [address] [place], [place] [address]  Basic metabolic panel     Status: Abnormal   Collection Time: 07/23/18  3:10 AM  Result Value Ref Range   Sodium 136 135 - 145 mmol/L   Potassium 3.8 3.5 - 5.1 mmol/L   Chloride 104 98 - 111 mmol/L   CO2 23 22 - 32 mmol/L   Glucose, Bld 104 (H) 70 - 99 mg/dL   BUN 17 8 - 23 mg/dL   Creatinine, Ser 1.11 (H) 0.44 - 1.00 mg/dL   Calcium 8.5 (L) 8.9 - 10.3 mg/dL   GFR calc non Af Amer 52 (L) >60 mL/min   GFR calc Af Amer >60 >60 mL/min   Anion gap 9 5 - 15    Comment: Performed at [facility], [place] [address] [place], [place] [address]    ECG   Sinus rhythm - Personally Reviewed  Telemetry   Sinus rhythm - Personally Reviewed  Radiology    Dg Chest 2 View  Result Date: 07/21/2018 CLINICAL DATA:  Back pain EXAM: CHEST - 2 VIEW COMPARISON:  07/20/2018 FINDINGS: Cardiac shadow is enlarged but stable. Increasing bibasilar atelectatic changes are seen. Small effusions are noted as well. These have increased in the interval from the prior exam. No focal confluent infiltrate is seen. Degenerative changes of the thoracic spine are noted. IMPRESSION: Increasing bibasilar atelectasis and small effusions. Electronically Signed   By: Inez Catalina M.D.   On: 07/21/2018 20:10    Cardiac Studies   LV EF: 55% -   60%  ------------------------------------------------------------------- Indications:      Pericardial effusion  423.9.  ------------------------------------------------------------------- Study Conclusions  - Left ventricle: The cavity size was normal. Systolic function was   normal. The estimated ejection fraction was in the range of 55%   to 60%. Wall motion was normal; there were no regional wall   motion abnormalities. - Pericardium, extracardiac: A small pericardial effusion was   identified circumferential to the heart. There was no evidence of   hemodynamic compromise.  Impressions:  - There is no visual evidence of hemodynamic compromise as it   pertains to small pericardial effusion.  Assessment   Principal Problem:   Pericarditis Active Problems:   Febrile illness   Acute upper back pain   Plan   Leukocytosis improved today - likely related to acute inflammation. Pain has improved. Tolerating ASA and colchicine. Some loose stool but she is tolerating it. Small drop in hemoglobin overnight- ?cause - consider hemoccult. ?dilutional - no S/S of bleeding. May be appropriate for d/c today or reassess H/H tomorrow. Continue current treatment for pericarditis/myocarditis. Check repeat echo in 1 month and repeat ESR/CRP. Desires to follow-up with me in the office.  Time Spent Directly with Patient:  I have spent a total of 25 minutes with the patient reviewing hospital notes, telemetry, EKGs, labs and examining the patient as well as establishing an assessment and plan that was discussed personally with the patient.  > 50% of time was spent in direct patient care.  Length of Stay:  LOS: 1 day   Pixie Casino, MD, [facility], [place] Director of the [facility]  [facility] Diplomate of the American Board of Clinical Lipidology Attending Cardiologist  Direct Dial: [phone]  Fax: [phone]  Website:  [email or web address] [email or web address] 07/23/2018, 9:24 AM

## 2018-07-24 LAB — ANA W/REFLEX IF POSITIVE: Anti Nuclear Antibody(ANA): NEGATIVE

## 2018-07-24 LAB — RHEUMATOID FACTOR: Rheumatoid fact SerPl-aCnc: 18.1 IU/mL — ABNORMAL HIGH (ref 0.0–13.9)

## 2018-07-26 LAB — CULTURE, BLOOD (ROUTINE X 2)
Culture: NO GROWTH
Culture: NO GROWTH
Special Requests: ADEQUATE
Special Requests: ADEQUATE

## 2018-07-28 ENCOUNTER — Other Ambulatory Visit: Payer: Self-pay

## 2018-07-28 ENCOUNTER — Emergency Department (HOSPITAL_COMMUNITY): Payer: BLUE CROSS/BLUE SHIELD

## 2018-07-28 ENCOUNTER — Telehealth: Payer: Self-pay | Admitting: Internal Medicine

## 2018-07-28 ENCOUNTER — Encounter (HOSPITAL_COMMUNITY): Payer: Self-pay

## 2018-07-28 ENCOUNTER — Emergency Department (HOSPITAL_COMMUNITY)
Admission: EM | Admit: 2018-07-28 | Discharge: 2018-07-28 | Disposition: A | Discharge status: Home / Self Care | Payer: BLUE CROSS/BLUE SHIELD | Attending: Emergency Medicine | Admitting: Emergency Medicine

## 2018-07-28 DIAGNOSIS — R079 Chest pain, unspecified: Secondary | ICD-10-CM | POA: Diagnosis not present

## 2018-07-28 DIAGNOSIS — Z79899 Other long term (current) drug therapy: Secondary | ICD-10-CM | POA: Insufficient documentation

## 2018-07-28 DIAGNOSIS — I10 Essential (primary) hypertension: Secondary | ICD-10-CM | POA: Insufficient documentation

## 2018-07-28 DIAGNOSIS — R1110 Vomiting, unspecified: Secondary | ICD-10-CM | POA: Diagnosis not present

## 2018-07-28 DIAGNOSIS — R1111 Vomiting without nausea: Secondary | ICD-10-CM

## 2018-07-28 LAB — CBC
HCT: 35.7 % — ABNORMAL LOW (ref 36.0–46.0)
Hemoglobin: 11.4 g/dL — ABNORMAL LOW (ref 12.0–15.0)
MCH: 31.3 pg (ref 26.0–34.0)
MCHC: 31.9 g/dL (ref 30.0–36.0)
MCV: 98.1 fL (ref 80.0–100.0)
Platelets: 366 10*3/uL (ref 150–400)
RBC: 3.64 MIL/uL — ABNORMAL LOW (ref 3.87–5.11)
RDW: 12.3 % (ref 11.5–15.5)
WBC: 19.3 10*3/uL — ABNORMAL HIGH (ref 4.0–10.5)
nRBC: 0 % (ref 0.0–0.2)

## 2018-07-28 LAB — BASIC METABOLIC PANEL
Anion gap: 12 (ref 5–15)
BUN: 6 mg/dL — ABNORMAL LOW (ref 8–23)
CO2: 25 mmol/L (ref 22–32)
Calcium: 8.5 mg/dL — ABNORMAL LOW (ref 8.9–10.3)
Chloride: 100 mmol/L (ref 98–111)
Creatinine, Ser: 0.73 mg/dL (ref 0.44–1.00)
GFR calc Af Amer: >60 mL/min (ref >60)
GFR calc non Af Amer: >60 mL/min (ref >60)
Glucose, Bld: 112 mg/dL — ABNORMAL HIGH (ref 70–99)
Potassium: 3.5 mmol/L (ref 3.5–5.1)
Sodium: 137 mmol/L (ref 135–145)

## 2018-07-28 LAB — I-STAT TROPONIN, ED: Troponin i, poc: 0.03 ng/mL (ref 0.00–0.08)

## 2018-07-28 MED ORDER — ONDANSETRON HCL 4 MG PO TABS
4.0000 mg | ORAL_TABLET | Freq: Once | ORAL | Status: AC
  Administered 2018-07-28: 4 mg via ORAL
  Filled 2018-07-28: qty 1

## 2018-07-28 MED ORDER — ONDANSETRON HCL 4 MG PO TABS: 4.0000 mg | ORAL_TABLET | Freq: Four times a day (QID) | ORAL | 0 refills | Status: AC

## 2018-07-28 MED ORDER — INDOMETHACIN 50 MG PO CAPS: 50.0000 mg | ORAL_CAPSULE | Freq: Three times a day (TID) | ORAL | 0 refills | Status: AC

## 2018-07-28 MED ORDER — ASPIRIN 81 MG PO CHEW
324.0000 mg | CHEWABLE_TABLET | Freq: Once | ORAL | Status: DC
  Filled 2018-07-28: qty 4

## 2018-07-28 NOTE — ED Triage Notes (Signed)
Pt arrives w CP w history of pericarditis w recent admission, d/c'd Tuesday, some pain in front of chest and burning in her back. Hx RA Reports medications are making her sick

## 2018-07-28 NOTE — Telephone Encounter (Signed)
[name] is a 62y/o woman with recent admission with pericarditis. She has been taking her aspirin and colchicine as prescribed but continues to feel nauseous and have burning in her chest. Given her symptoms, I recommended that she come to the ER to be evaluated. She is understanding of this and will try to come in today. I will let this oncoming call team know.

## 2018-07-28 NOTE — ED Notes (Signed)
Pt given discharge instructions. Pt given prescriptions and follow up and information. Pt given the opportunity to ask questions. Pt verbalized understanding.

## 2018-07-28 NOTE — ED Provider Notes (Signed)
[facility] EMERGENCY DEPARTMENT Provider Note   CSN: [id number] Arrival date & time: 07/28/18  6222     History   Chief Complaint Chief Complaint  Patient presents with  . Chest Pain    HPI Erica Oneill is a 63 y.o. female.  The history is provided by the patient.  Emesis  Severity:  Mild Timing:  Constant Quality:  Stomach contents Progression:  Unchanged Chronicity:  New Recent urination:  Normal Relieved by:  Nothing Exacerbated by: colchicine. Associated symptoms: diarrhea   Associated symptoms: no abdominal pain, no arthralgias, no chills, no cough, no fever, no headaches, no sore throat and no URI   Risk factors comment:  Recent pericarditis dx and on colchicine and aspirin, states diarrhea and vomting since starting colchicine, chest pain improving   Past Medical History:  Diagnosis Date  . Hypertension     Patient Active Problem List   Diagnosis Date Noted  . Pericarditis 07/20/2018  . Syncope 07/20/2018    Past Surgical History:  Procedure Laterality Date  . ABDOMINAL HYSTERECTOMY    . TORN MENISCUS IN LEFT KNEE Left      OB History   No obstetric history on file.      Home Medications    Prior to Admission medications   Medication Sig Start Date End Date Taking? Authorizing Provider  acetaminophen (TYLENOL) 500 MG tablet Take 500 mg by mouth every 6 (six) hours as needed for mild pain or headache.   Yes [provider]  aspirin 325 MG tablet Take 2 tablets (650 mg total) by mouth 3 (three) times daily for 30 days. 07/23/18 08/22/18 Yes [place], Eric J, DO  colchicine 0.6 MG tablet Take 1 tablet (0.6 mg total) by mouth 2 (two) times daily. 07/23/18 10/21/18 Yes [place], Eric J, DO  pantoprazole (PROTONIX) 40 MG tablet Take 1 tablet (40 mg total) by mouth daily. 07/24/18 10/22/18 Yes [place], Eric J, DO  indomethacin (INDOCIN) 50 MG capsule Take 1 capsule (50 mg total) by mouth 3 (three) times daily with meals for 21  days. 07/28/18 08/18/18  Jaylynn Siefert, DO  ondansetron (ZOFRAN) 4 MG tablet Take 1 tablet (4 mg total) by mouth every 6 (six) hours for 15 doses. 07/28/18 08/01/18  Virgina Norfolk, DO    Family History Family History  Problem Relation Age of Onset  . Hypertension Mother   . Heart attack Mother 36  . Cancer Father   . Heart attack Maternal Grandfather 61    Social History Social History   Tobacco Use  . Smoking status: Never Smoker  . Smokeless tobacco: Never Used  Substance Use Topics  . Alcohol use: Not Currently  . Drug use: Never     Allergies   Lidocaine; Rocephin [ceftriaxone]; Fentanyl; Morphine and related; and Other   Review of Systems Review of Systems  Constitutional: Negative for chills and fever.  HENT: Negative for ear pain and sore throat.   Eyes: Negative for pain and visual disturbance.  Respiratory: Negative for cough and shortness of breath.   Cardiovascular: Positive for chest pain. Negative for palpitations.  Gastrointestinal: Positive for diarrhea and vomiting. Negative for abdominal pain.  Genitourinary: Negative for dysuria and hematuria.  Musculoskeletal: Negative for arthralgias and back pain.  Skin: Negative for color change and rash.  Neurological: Negative for seizures, syncope and headaches.  All other systems reviewed and are negative.    Physical Exam Updated Vital Signs  ED Triage Vitals  Enc Vitals Group  BP 07/28/18 0855 126/78     Pulse Rate 07/28/18 0855 (!) 107     Resp 07/28/18 0855 (!) 28     Temp 07/28/18 0855 98.8 F (37.1 C)     Temp Source 07/28/18 0855 Oral     SpO2 07/28/18 0855 94 %     Weight 07/28/18 0850 160 lb 0.9 oz (72.6 kg)     Height 07/28/18 0850 5\' 5"  (1.651 m)     Head Circumference --      Peak Flow --      Pain Score 07/28/18 0849 4     Pain Loc --      Pain Edu? --      Excl. in GC? --     Physical Exam Vitals signs and nursing note reviewed.  Constitutional:      General: She is not in  acute distress.    Appearance: She is well-developed.  HENT:     Head: Normocephalic and atraumatic.  Eyes:     Conjunctiva/sclera: Conjunctivae normal.  Neck:     Musculoskeletal: Neck supple.  Cardiovascular:     Rate and Rhythm: Normal rate and regular rhythm.     Pulses:          Radial pulses are 2+ on the right side and 2+ on the left side.       Dorsalis pedis pulses are 2+ on the right side and 2+ on the left side.     Heart sounds: Normal heart sounds. No murmur. No friction rub.  Pulmonary:     Effort: Pulmonary effort is normal. No respiratory distress.     Breath sounds: Normal breath sounds. No decreased breath sounds, wheezing, rhonchi or rales.  Abdominal:     Palpations: Abdomen is soft.     Tenderness: There is no abdominal tenderness.  Musculoskeletal:     Right lower leg: No edema.     Left lower leg: No edema.  Skin:    General: Skin is warm and dry.     Capillary Refill: Capillary refill takes less than 2 seconds.  Neurological:     General: No focal deficit present.     Mental Status: She is alert.      ED Treatments / Results  Labs (all labs ordered are listed, but only abnormal results are displayed) Labs Reviewed  BASIC METABOLIC PANEL - Abnormal; Notable for the following components:      Result Value   Glucose, Bld 112 (*)    BUN 6 (*)    Calcium 8.5 (*)    All other components within normal limits  CBC - Abnormal; Notable for the following components:   WBC 19.3 (*)    RBC 3.64 (*)    Hemoglobin 11.4 (*)    HCT 35.7 (*)    All other components within normal limits  I-STAT TROPONIN, ED    EKG None  Radiology Dg Chest Portable 1 View  Result Date: 07/28/2018 CLINICAL DATA:  Chest pain, shortness of breath EXAM: PORTABLE CHEST 1 VIEW COMPARISON:  07/21/2018 FINDINGS: Platelike scarring/atelectasis in the lingula. Mild patchy bilateral lower lobe opacities, likely a combination of atelectasis and small bilateral pleural effusions.  Pulmonary vascular congestion without frank interstitial edema. Cardiomegaly. IMPRESSION: Mild patchy bilateral lower lobe opacities, likely atelectasis with small bilateral pleural effusions. Pulmonary vascular congestion.  No frank interstitial edema. Electronically Signed   By: Charline BillsSriyesh  Krishnan M.D.   On: 07/28/2018 09:06    Procedures Procedures (including critical care  time)  Medications Ordered in ED Medications  aspirin chewable tablet 324 mg (0 mg Oral Hold 07/28/18 0941)  ondansetron (ZOFRAN) tablet 4 mg (4 mg Oral Given 07/28/18 0946)     Initial Impression / Assessment and Plan / ED Course  I have reviewed the triage vital signs and the nursing notes.  Pertinent labs & imaging results that were available during my care of the patient were reviewed by me and considered in my medical decision making (see chart for details).     ANTIA RAHAL is a 63 year old female history of hypertension who presents to the ED with emesis, diarrhea.  Patient with normal vitals.  No fever.  Patient states that she is currently on colchicine and aspirin for pericarditis.  She states that ever since she has started the colchicine she has had belly aches, diarrhea, nausea, vomiting.  She was not started on indomethacin due to kidney injury while in the hospital.  Has no prior kidney problems in the past.  She overall states that her chest pain has improved.  But she feels as if the colchicine is making her feel worse in the chest pain.  EKG shows sinus rhythm.  There is no ST elevations or PR depressions.  Patient had lab work done prior to my evaluation that showed normal troponin, no significant anemia, electrolyte abnormality, kidney injury.  Creatinine has improved.  Patient continues to have leukocytosis but likely in the setting of stress reaction from vomiting and as well as ongoing pericarditis.  She has no abdominal tenderness on exam.  Patient was given Zofran.  Called cardiology on the phone and  given that she has had improvement of her kidney function and failure to tolerate colchicine cardiology recommends starting indomethacin 50 mg 3 times a day for the next several weeks.  Patient does have follow-up outpatient with cardiology this week.  Will need repeat creatinine as well at some point.  Patient was extremely happy with this plan.  She was discharged from ED in good condition.  Told her return to ED if symptoms worsen.  This chart was dictated using voice recognition software.  Despite best efforts to proofread,  errors can occur which can change the documentation meaning.     Final Clinical Impressions(s) / ED Diagnoses   Final diagnoses:  Vomiting without nausea, intractability of vomiting not specified, unspecified vomiting type    ED Discharge Orders         Ordered    indomethacin (INDOCIN) 50 MG capsule  3 times daily with meals     07/28/18 1024    ondansetron (ZOFRAN) 4 MG tablet  Every 6 hours     07/28/18 1024           Virgina Norfolk, DO 07/28/18 1119

## 2018-07-28 NOTE — Discharge Instructions (Signed)
Discontinue both your aspirin and your colchicine.  Start indomethacin as prescribed.  Follow-up with your cardiologist as discussed.  Follow-up with your primary care doctor in a week for repeat kidney function.

## 2018-08-06 DIAGNOSIS — Z7982 Long term (current) use of aspirin: Secondary | ICD-10-CM

## 2018-08-06 HISTORY — DX: Long term (current) use of aspirin: Z79.82

## 2018-08-28 DIAGNOSIS — F418 Other specified anxiety disorders: Secondary | ICD-10-CM

## 2018-08-28 DIAGNOSIS — R319 Hematuria, unspecified: Secondary | ICD-10-CM

## 2018-08-28 DIAGNOSIS — M2550 Pain in unspecified joint: Secondary | ICD-10-CM | POA: Insufficient documentation

## 2018-08-28 DIAGNOSIS — K625 Hemorrhage of anus and rectum: Secondary | ICD-10-CM

## 2018-08-28 DIAGNOSIS — R509 Fever, unspecified: Secondary | ICD-10-CM

## 2018-08-28 HISTORY — DX: Pain in unspecified joint: M25.50

## 2018-08-28 HISTORY — DX: Other specified anxiety disorders: F41.8

## 2018-08-28 HISTORY — DX: Fever, unspecified: R50.9

## 2018-08-28 HISTORY — DX: Hematuria, unspecified: R31.9

## 2018-08-28 HISTORY — DX: Hemorrhage of anus and rectum: K62.5

## 2018-09-03 DIAGNOSIS — I471 Supraventricular tachycardia, unspecified: Secondary | ICD-10-CM

## 2018-09-03 DIAGNOSIS — R002 Palpitations: Secondary | ICD-10-CM

## 2018-09-03 DIAGNOSIS — I10 Essential (primary) hypertension: Secondary | ICD-10-CM | POA: Insufficient documentation

## 2018-09-03 HISTORY — DX: Palpitations: R00.2

## 2018-09-03 HISTORY — DX: Supraventricular tachycardia, unspecified: I47.10

## 2018-09-03 HISTORY — DX: Essential (primary) hypertension: I10

## 2018-09-03 HISTORY — DX: Supraventricular tachycardia: I47.1

## 2018-12-13 DIAGNOSIS — N3001 Acute cystitis with hematuria: Secondary | ICD-10-CM

## 2018-12-13 HISTORY — DX: Acute cystitis with hematuria: N30.01

## 2019-03-28 ENCOUNTER — Ambulatory Visit (INDEPENDENT_AMBULATORY_CARE_PROVIDER_SITE_OTHER): Payer: Medicare Other

## 2019-03-28 ENCOUNTER — Encounter: Payer: Self-pay | Admitting: Cardiology

## 2019-03-28 ENCOUNTER — Other Ambulatory Visit: Payer: Self-pay

## 2019-03-28 ENCOUNTER — Ambulatory Visit (INDEPENDENT_AMBULATORY_CARE_PROVIDER_SITE_OTHER): Payer: Medicare Other | Admitting: Cardiology

## 2019-03-28 VITALS — BP 130/80 | HR 71 | Ht 65.0 in | Wt 195.0 lb

## 2019-03-28 DIAGNOSIS — I10 Essential (primary) hypertension: Secondary | ICD-10-CM

## 2019-03-28 DIAGNOSIS — I471 Supraventricular tachycardia: Secondary | ICD-10-CM | POA: Diagnosis not present

## 2019-03-28 DIAGNOSIS — R002 Palpitations: Secondary | ICD-10-CM | POA: Diagnosis not present

## 2019-03-28 NOTE — Patient Instructions (Signed)
Medication Instructions:  Your physician recommends that you continue on your current medications as directed. Please refer to the Current Medication list given to you today.  If you need a refill on your cardiac medications before your next appointment, please call your pharmacy.   Lab work: Your physician recommends that you have a TSH drawn today.  If you have labs (blood work) drawn today and your tests are completely normal, you will receive your results only by: Marland Kitchen MyChart Message (if you have MyChart) OR . A paper copy in the mail If you have any lab test that is abnormal or we need to change your treatment, we will call you to review the results.  Testing/Procedures: You had an EKG performed today  Your physician has recommended that you wear a ZIO monitor. ZIO monitors are medical devices that record the heart's electrical activity. Doctors most often use these monitors to diagnose arrhythmias. Arrhythmias are problems with the speed or rhythm of the heartbeat. The monitor is a small, portable device. You can wear one while you do your normal daily activities. This is usually used to diagnose what is causing palpitations/syncope (passing out).You will wear this device for 14 days.  Your physician has requested that you have an echocardiogram. Echocardiography is a painless test that uses sound waves to create images of your heart. It provides your doctor with information about the size and shape of your heart and how well your heart's chambers and valves are working. This procedure takes approximately one hour. There are no restrictions for this procedure.  Follow-Up: At [facility], you and your health needs are our priority.  As part of our continuing mission to provide you with exceptional heart care, we have created designated Provider Care Teams.  These Care Teams include your primary Cardiologist (physician) and Advanced Practice Providers (APPs -  Physician Assistants and Nurse  Practitioners) who all work together to provide you with the care you need, when you need it. You will need a follow up appointment in 6 months.    Any Other Special Instructions Will Be Listed Below   Echocardiogram An echocardiogram is a procedure that uses painless sound waves (ultrasound) to produce an image of the heart. Images from an echocardiogram can provide important information about:  Signs of coronary artery disease (CAD).  Aneurysm detection. An aneurysm is a weak or damaged part of an artery wall that bulges out from the normal force of blood pumping through the body.  Heart size and shape. Changes in the size or shape of the heart can be associated with certain conditions, including heart failure, aneurysm, and CAD.  Heart muscle function.  Heart valve function.  Signs of a past heart attack.  Fluid buildup around the heart.  Thickening of the heart muscle.  A tumor or infectious growth around the heart valves. Tell a health care provider about:  Any allergies you have.  All medicines you are taking, including vitamins, herbs, eye drops, creams, and over-the-counter medicines.  Any blood disorders you have.  Any surgeries you have had.  Any medical conditions you have.  Whether you are pregnant or may be pregnant. What are the risks? Generally, this is a safe procedure. However, problems may occur, including:  Allergic reaction to dye (contrast) that may be used during the procedure. What happens before the procedure? No specific preparation is needed. You may eat and drink normally. What happens during the procedure?   An IV tube may be inserted into  one of your veins.  You may receive contrast through this tube. A contrast is an injection that improves the quality of the pictures from your heart.  A gel will be applied to your chest.  A wand-like tool (transducer) will be moved over your chest. The gel will help to transmit the sound waves from  the transducer.  The sound waves will harmlessly bounce off of your heart to allow the heart images to be captured in real-time motion. The images will be recorded on a computer. The procedure may vary among health care providers and hospitals. What happens after the procedure?  You may return to your normal, everyday life, including diet, activities, and medicines, unless your health care provider tells you not to do that. Summary  An echocardiogram is a procedure that uses painless sound waves (ultrasound) to produce an image of the heart.  Images from an echocardiogram can provide important information about the size and shape of your heart, heart muscle function, heart valve function, and fluid buildup around your heart.  You do not need to do anything to prepare before this procedure. You may eat and drink normally.  After the echocardiogram is completed, you may return to your normal, everyday life, unless your health care provider tells you not to do that. This information is not intended to replace advice given to you by your health care provider. Make sure you discuss any questions you have with your health care provider. Document Released: 06/09/2000 Document Revised: 10/03/2018 Document Reviewed: 07/15/2016 Elsevier Patient Education  2020 [employer].

## 2019-03-28 NOTE — Addendum Note (Signed)
Addended by: Beckey Rutter on: 03/28/2019 09:03 AM   Modules accepted: Orders

## 2019-03-28 NOTE — Progress Notes (Signed)
Cardiology Office Note:    Date:  03/28/2019   ID:  Erica Oneill, DOB 1955-12-20, MRN [id number]  PCP:  Myrlene Broker, MD  Cardiologist:  Jenean Lindau, MD   Referring MD: Myrlene Broker, MD    ASSESSMENT:    1. PSVT (paroxysmal supraventricular tachycardia) ([place])   2. Essential hypertension    PLAN:    In order of problems listed above:  1. Palpitations: I discussed my findings with the patient at extensive length.  It appears to me that this is most likely a form of atrial arrhythmia.  I will get a TSH as she has not had one recently.  She will have a 2-week ZIO also.  In view of pericarditis history of GERD with an echocardiogram.  This will also help me assess murmur heard on auscultation. 2. Essential hypertension: Blood pressure is stable.  Diet was discussed.  She is overweight and weight reduction was discussed.  Importance of regular exercise stressed and she vocalized understanding.  She denies any chest pain orthopnea or any such symptoms. 3. Patient will be seen in follow-up appointment in 6 months or earlier if the patient has any concerns 4. She knows to go to the nearest emergency room for any concerning symptoms.   Medication Adjustments/Labs and Tests Ordered: Current medicines are reviewed at length with the patient today.  Concerns regarding medicines are outlined above.  No orders of the defined types were placed in this encounter.  No orders of the defined types were placed in this encounter.    Chief Complaint  Patient presents with   Tachycardia     History of Present Illness:    Erica Oneill is a 63 y.o. female.  Patient has past medical history of essential hypertension.  She mentions to me that she has had pericarditis and was treated for it earlier this year.  She was at [facility].  She mentions to me that on Monday she had an episode of palpitations which lasted 15 minutes.  She is a retired Marine scientist.  She mentions to me  that her palpitations were very irregular.  She feels it was SVT.  It spontaneously resolved when she took an extra dose of Bystolic.  At the time of my evaluation, the patient is alert awake oriented and in no distress.  Past Medical History:  Diagnosis Date   Acute cystitis with hematuria 12/13/2018   Arthralgia of multiple sites 08/28/2018   Essential hypertension 09/03/2018   Febrile illness 08/28/2018   Hematuria 08/28/2018   Hypertension    Long-term use of aspirin therapy 08/06/2018   Palpitations 09/03/2018   Pericarditis 07/20/2018   PSVT (paroxysmal supraventricular tachycardia) ([facility]) 09/03/2018   Rectal bleeding 08/28/2018   Situational anxiety 08/28/2018   Syncope 07/20/2018    Past Surgical History:  Procedure Laterality Date   ABDOMINAL HYSTERECTOMY     TORN MENISCUS IN LEFT KNEE Left     Current Medications: Current Meds  Medication Sig   nebivolol (BYSTOLIC) 5 MG tablet Take by mouth.     Allergies:   Lidocaine, Rocephin [ceftriaxone], Fentanyl, Morphine and related, Sulfamethoxazole-trimethoprim, and Other   Social History   Socioeconomic History   Marital status: Unknown    Spouse name: Not on file   Number of children: Not on file   Years of education: Not on file   Highest education level: Not on file  Occupational History   Not on file  Social Needs   Financial  resource strain: Not on file   Food insecurity    Worry: Not on file    Inability: Not on file   Transportation needs    Medical: Not on file    Non-medical: Not on file  Tobacco Use   Smoking status: Never Smoker   Smokeless tobacco: Never Used  Substance and Sexual Activity   Alcohol use: Not Currently   Drug use: Never   Sexual activity: Not on file  Lifestyle   Physical activity    Days per week: Not on file    Minutes per session: Not on file   Stress: Not on file  Relationships   Social connections    Talks on phone: Not on file    Gets together: Not on  file    Attends religious service: Not on file    Active member of club or organization: Not on file    Attends meetings of clubs or organizations: Not on file    Relationship status: Not on file  Other Topics Concern   Not on file  Social History Narrative   Not on file     Family History: The patient's family history includes Cancer in her father; Heart attack (age of onset: 70) in her maternal grandfather; Heart attack (age of onset: 54) in her mother; Hypertension in her mother.  ROS:   Please see the history of present illness.    All other systems reviewed and are negative.  EKGs/Labs/Other Studies Reviewed:    The following studies were reviewed today: EKG reveals sinus rhythm and nonspecific ST-T changes   Recent Labs: 07/21/2018: ALT 11 07/28/2018: BUN 6; Creatinine, Ser 0.73; Hemoglobin 11.4; Platelets 366; Potassium 3.5; Sodium 137  Recent Lipid Panel No results found for: CHOL, TRIG, HDL, CHOLHDL, VLDL, LDLCALC, LDLDIRECT  Physical Exam:    VS:  BP 130/80 (BP Location: Right Arm, Patient Position: Sitting, Cuff Size: Normal)    Ht 5\' 5"  (1.651 m)    Wt 195 lb (88.5 kg)    SpO2 99%    BMI 32.45 kg/m     Wt Readings from Last 3 Encounters:  03/28/19 195 lb (88.5 kg)  07/28/18 160 lb 0.9 oz (72.6 kg)  07/22/18 160 lb (72.6 kg)     GEN: Patient is in no acute distress HEENT: Normal NECK: No JVD; No carotid bruits LYMPHATICS: No lymphadenopathy CARDIAC: Hear sounds regular, 2/6 systolic murmur at the apex. RESPIRATORY:  Clear to auscultation without rales, wheezing or rhonchi  ABDOMEN: Soft, non-tender, non-distended MUSCULOSKELETAL:  No edema; No deformity  SKIN: Warm and dry NEUROLOGIC:  Alert and oriented x 3 PSYCHIATRIC:  Normal affect   Signed, 07/24/18, MD  03/28/2019 8:40 AM    [facility]

## 2019-03-29 LAB — TSH: TSH: 3.16 u[IU]/mL (ref 0.450–4.500)

## 2019-03-29 LAB — SEDIMENTATION RATE: Sed Rate: 18 mm/hr (ref 0–40)

## 2019-04-01 ENCOUNTER — Telehealth: Payer: Self-pay

## 2019-04-01 ENCOUNTER — Telehealth: Payer: Self-pay | Admitting: Cardiology

## 2019-04-01 NOTE — Telephone Encounter (Signed)
Results relayed, no further questions. Copy sent to Dr. Unk Lightning per Dr. Docia Furl request.

## 2019-04-01 NOTE — Telephone Encounter (Signed)
Wants lab results

## 2019-04-01 NOTE — Telephone Encounter (Signed)
-----   Message from Jenean Lindau, MD sent at 03/29/2019  5:03 PM EDT ----- The results of the study is unremarkable. Please inform patient. I will discuss in detail at next appointment. Cc  primary care/referring physician Jenean Lindau, MD 03/29/2019 5:03 PM

## 2019-04-09 DIAGNOSIS — I471 Supraventricular tachycardia: Secondary | ICD-10-CM

## 2019-04-09 DIAGNOSIS — I10 Essential (primary) hypertension: Secondary | ICD-10-CM

## 2019-04-09 DIAGNOSIS — R002 Palpitations: Secondary | ICD-10-CM

## 2019-05-05 ENCOUNTER — Other Ambulatory Visit: Payer: Self-pay

## 2019-05-05 ENCOUNTER — Ambulatory Visit (INDEPENDENT_AMBULATORY_CARE_PROVIDER_SITE_OTHER): Payer: Medicare Other

## 2019-05-05 DIAGNOSIS — I471 Supraventricular tachycardia: Secondary | ICD-10-CM

## 2019-05-05 DIAGNOSIS — I10 Essential (primary) hypertension: Secondary | ICD-10-CM

## 2019-05-05 DIAGNOSIS — R002 Palpitations: Secondary | ICD-10-CM

## 2019-05-05 NOTE — Progress Notes (Signed)
Complete echocardiogram has been performed.  Jimmy Larrisha Babineau RDCS, RVT 

## 2019-05-08 ENCOUNTER — Telehealth: Payer: Self-pay

## 2019-05-08 NOTE — Telephone Encounter (Signed)
Results for echo and monitor relayed, copy sent to Dr. Unk Lightning

## 2019-05-08 NOTE — Telephone Encounter (Signed)
-----   Message from Jenean Lindau, MD sent at 04/22/2019  3:32 PM EDT ----- She is already on a beta-blocker and she should continue it.  She has very rare brief fast plan of atrial arrhythmia.  Unremarkable monitoring otherwise.  Cc PCP Jenean Lindau, MD 04/22/2019 3:31 PM

## 2019-08-25 ENCOUNTER — Other Ambulatory Visit: Payer: Self-pay

## 2019-08-25 ENCOUNTER — Encounter: Payer: Self-pay | Admitting: Ophthalmology

## 2019-08-25 ENCOUNTER — Other Ambulatory Visit
Admission: RE | Admit: 2019-08-25 | Discharge: 2019-08-25 | Disposition: A | Discharge status: Home / Self Care | Payer: Medicare Other | Source: Ambulatory Visit | Attending: Ophthalmology | Admitting: Ophthalmology

## 2019-08-25 DIAGNOSIS — Z20822 Contact with and (suspected) exposure to covid-19: Secondary | ICD-10-CM | POA: Diagnosis not present

## 2019-08-25 DIAGNOSIS — Z01812 Encounter for preprocedural laboratory examination: Secondary | ICD-10-CM | POA: Diagnosis present

## 2019-08-25 NOTE — Anesthesia Preprocedure Evaluation (Addendum)
Anesthesia Evaluation  Patient identified by MRN, date of birth, ID band Patient awake    Reviewed: Allergy & Precautions, NPO status , Patient's Chart, lab work & pertinent test results  History of Anesthesia Complications Negative for: history of anesthetic complications  Airway Mallampati: II  TM Distance: >3 FB Neck ROM: Full    Dental   Pulmonary    breath sounds clear to auscultation       Cardiovascular hypertension, (-) angina(-) DOE + dysrhythmias Atrial Fibrillation and Supra Ventricular Tachycardia  Rhythm:Regular Rate:Normal   H/o pericarditis 06/2018   Neuro/Psych PSYCHIATRIC DISORDERS Anxiety    GI/Hepatic neg GERD  ,  Endo/Other    Renal/GU      Musculoskeletal   Abdominal   Peds  Hematology   Anesthesia Other Findings   Reproductive/Obstetrics                            Anesthesia Physical Anesthesia Plan  ASA: II  Anesthesia Plan: MAC   Post-op Pain Management:    Induction: Intravenous  PONV Risk Score and Plan: 2 and TIVA, Midazolam and Treatment may vary due to age or medical condition  Airway Management Planned: Nasal Cannula  Additional Equipment:   Intra-op Plan:   Post-operative Plan:   Informed Consent: I have reviewed the patients History and Physical, chart, labs and discussed the procedure including the risks, benefits and alternatives for the proposed anesthesia with the patient or authorized representative who has indicated his/her understanding and acceptance.       Plan Discussed with: CRNA and Anesthesiologist  Anesthesia Plan Comments:         Anesthesia Quick Evaluation

## 2019-08-26 LAB — SARS CORONAVIRUS 2 (TAT 6-24 HRS): SARS Coronavirus 2: NEGATIVE

## 2019-08-26 NOTE — Discharge Instructions (Signed)

## 2019-08-27 ENCOUNTER — Encounter
Admission: RE | Disposition: A | Discharge status: Home / Self Care | Payer: Self-pay | Source: Home / Self Care | Attending: Ophthalmology

## 2019-08-27 ENCOUNTER — Ambulatory Visit
Admission: RE | Admit: 2019-08-27 | Discharge: 2019-08-27 | Disposition: A | Discharge status: Home / Self Care | Payer: Medicare Other | Attending: Ophthalmology | Admitting: Ophthalmology

## 2019-08-27 ENCOUNTER — Encounter: Payer: Self-pay | Admitting: Ophthalmology

## 2019-08-27 ENCOUNTER — Other Ambulatory Visit: Payer: Self-pay

## 2019-08-27 ENCOUNTER — Ambulatory Visit: Payer: Medicare Other | Admitting: Anesthesiology

## 2019-08-27 DIAGNOSIS — Z885 Allergy status to narcotic agent status: Secondary | ICD-10-CM | POA: Insufficient documentation

## 2019-08-27 DIAGNOSIS — Z79899 Other long term (current) drug therapy: Secondary | ICD-10-CM | POA: Diagnosis not present

## 2019-08-27 DIAGNOSIS — I4891 Unspecified atrial fibrillation: Secondary | ICD-10-CM | POA: Insufficient documentation

## 2019-08-27 DIAGNOSIS — Z886 Allergy status to analgesic agent status: Secondary | ICD-10-CM | POA: Insufficient documentation

## 2019-08-27 DIAGNOSIS — Z888 Allergy status to other drugs, medicaments and biological substances status: Secondary | ICD-10-CM | POA: Diagnosis not present

## 2019-08-27 DIAGNOSIS — H2512 Age-related nuclear cataract, left eye: Secondary | ICD-10-CM | POA: Insufficient documentation

## 2019-08-27 DIAGNOSIS — Z7982 Long term (current) use of aspirin: Secondary | ICD-10-CM | POA: Diagnosis not present

## 2019-08-27 DIAGNOSIS — I10 Essential (primary) hypertension: Secondary | ICD-10-CM | POA: Diagnosis not present

## 2019-08-27 HISTORY — PX: CATARACT EXTRACTION W/PHACO: SHX586

## 2019-08-27 SURGERY — PHACOEMULSIFICATION, CATARACT, WITH IOL INSERTION
Anesthesia: Monitor Anesthesia Care | Site: Eye | Laterality: Left

## 2019-08-27 MED ORDER — TETRACAINE HCL 0.5 % OP SOLN
1.0000 [drp] | OPHTHALMIC | Status: DC | PRN
  Administered 2019-08-27 (×3): 1 [drp] via OPHTHALMIC

## 2019-08-27 MED ORDER — MIDAZOLAM HCL 2 MG/2ML IJ SOLN
INTRAMUSCULAR | Status: DC | PRN
  Administered 2019-08-27: 2 mg via INTRAVENOUS

## 2019-08-27 MED ORDER — BRIMONIDINE TARTRATE-TIMOLOL 0.2-0.5 % OP SOLN
OPHTHALMIC | Status: DC | PRN
  Administered 2019-08-27: 1 [drp] via OPHTHALMIC

## 2019-08-27 MED ORDER — NA HYALUR & NA CHOND-NA HYALUR 0.4-0.35 ML IO KIT
PACK | INTRAOCULAR | Status: DC | PRN
  Administered 2019-08-27: 1 mL via INTRAOCULAR

## 2019-08-27 MED ORDER — ACETAMINOPHEN 10 MG/ML IV SOLN: 1000.0000 mg | Freq: Once | INTRAVENOUS | Status: DC | PRN

## 2019-08-27 MED ORDER — MOXIFLOXACIN HCL 0.5 % OP SOLN
1.0000 [drp] | OPHTHALMIC | Status: DC | PRN
  Administered 2019-08-27 (×3): 1 [drp] via OPHTHALMIC

## 2019-08-27 MED ORDER — LACTATED RINGERS IV SOLN: 100.0000 mL/h | INTRAVENOUS | Status: DC

## 2019-08-27 MED ORDER — ONDANSETRON HCL 4 MG/2ML IJ SOLN: 4.0000 mg | Freq: Once | INTRAMUSCULAR | Status: DC | PRN

## 2019-08-27 MED ORDER — MOXIFLOXACIN HCL 0.5 % OP SOLN
OPHTHALMIC | Status: DC | PRN
  Administered 2019-08-27: 0.2 mL via OPHTHALMIC

## 2019-08-27 MED ORDER — TETRACAINE HCL 0.5 % OP SOLN
OPHTHALMIC | Status: DC | PRN
  Administered 2019-08-27: 2 [drp] via OPHTHALMIC

## 2019-08-27 MED ORDER — EPINEPHRINE PF 1 MG/ML IJ SOLN
INTRAOCULAR | Status: DC | PRN
  Administered 2019-08-27: 63 mL via OPHTHALMIC

## 2019-08-27 MED ORDER — ARMC OPHTHALMIC DILATING DROPS
1.0000 "application " | OPHTHALMIC | Status: DC | PRN
  Administered 2019-08-27 (×3): 1 via OPHTHALMIC

## 2019-08-27 SURGICAL SUPPLY — 29 items
CANNULA ANT/CHMB 27G (MISCELLANEOUS) ×1 IMPLANT
CANNULA ANT/CHMB 27GA (MISCELLANEOUS) ×2 IMPLANT
GLOVE SURG LX 7.5 STRW (GLOVE) ×2
GLOVE SURG LX STRL 7.5 STRW (GLOVE) ×1 IMPLANT
GLOVE SURG TRIUMPH 8.0 PF LTX (GLOVE) ×2 IMPLANT
GOWN STRL REUS W/ TWL LRG LVL3 (GOWN DISPOSABLE) ×2 IMPLANT
GOWN STRL REUS W/TWL LRG LVL3 (GOWN DISPOSABLE) ×4
LENS IOL DIOP 21.5 (Intraocular Lens) ×2 IMPLANT
LENS IOL TECNIS MONO 21.5 (Intraocular Lens) IMPLANT
MARKER SKIN DUAL TIP RULER LAB (MISCELLANEOUS) ×2 IMPLANT
NDL CAPSULORHEX 25GA (NEEDLE) ×1 IMPLANT
NDL FILTER BLUNT 18X1 1/2 (NEEDLE) ×2 IMPLANT
NDL RETROBULBAR .5 NSTRL (NEEDLE) IMPLANT
NEEDLE CAPSULORHEX 25GA (NEEDLE) ×2 IMPLANT
NEEDLE FILTER BLUNT 18X 1/2SAF (NEEDLE) ×2
NEEDLE FILTER BLUNT 18X1 1/2 (NEEDLE) ×2 IMPLANT
PACK CATARACT BRASINGTON (MISCELLANEOUS) ×2 IMPLANT
PACK EYE AFTER SURG (MISCELLANEOUS) ×2 IMPLANT
PACK OPTHALMIC (MISCELLANEOUS) ×2 IMPLANT
RING MALYGIN 7.0 (MISCELLANEOUS) IMPLANT
SOLUTION OPHTHALMIC SALT (MISCELLANEOUS) ×2 IMPLANT
SUT ETHILON 10-0 CS-B-6CS-B-6 (SUTURE)
SUT VICRYL  9 0 (SUTURE)
SUT VICRYL 9 0 (SUTURE) IMPLANT
SUTURE EHLN 10-0 CS-B-6CS-B-6 (SUTURE) IMPLANT
SYR 3ML LL SCALE MARK (SYRINGE) ×4 IMPLANT
SYR TB 1ML LUER SLIP (SYRINGE) ×2 IMPLANT
WATER STERILE IRR 250ML POUR (IV SOLUTION) ×2 IMPLANT
WIPE NON LINTING 3.25X3.25 (MISCELLANEOUS) ×2 IMPLANT

## 2019-08-27 NOTE — Op Note (Signed)
OPERATIVE NOTE  Erica Oneill [id number] 08/27/2019   PREOPERATIVE DIAGNOSIS:  Nuclear sclerotic cataract left eye. H25.12   POSTOPERATIVE DIAGNOSIS:    Nuclear sclerotic cataract left eye.     PROCEDURE:  Phacoemusification with posterior chamber intraocular lens placement of the left eye  Ultrasound time: Procedure(s) with comments: CATARACT EXTRACTION PHACO AND INTRAOCULAR LENS PLACEMENT (IOC) LEFT (Left) - 6.08 0:53.6 11.4%  LENS:   Implant Name Type Inv. Item Serial No. Manufacturer Lot No. LRB No. Used Action  LENS IOL DIOP 21.5 - [id number] Intraocular Lens LENS IOL DIOP 21.5 [id number] AMO  Left 1 Implanted      SURGEON:  Deirdre Evener, MD   ANESTHESIA:  Topical with tetracaine drops    COMPLICATIONS:  None.   DESCRIPTION OF PROCEDURE:  The patient was identified in the holding room and transported to the operating room and placed in the supine position under the operating microscope.  The left eye was identified as the operative eye and it was prepped and draped in the usual sterile ophthalmic fashion.   A 1 millimeter clear-corneal paracentesis was made at the 1:30 position.  The anterior chamber was filled with Viscoat viscoelastic.  A 2.4 millimeter keratome was used to make a near-clear corneal incision at the 10:30 position.  .  A curvilinear capsulorrhexis was made with a cystotome and capsulorrhexis forceps.  Balanced salt solution was used to hydrodissect and hydrodelineate the nucleus.   Phacoemulsification was then used in stop and chop fashion to remove the lens nucleus and epinucleus.  The remaining cortex was then removed using the irrigation and aspiration handpiece. Provisc was then placed into the capsular bag to distend it for lens placement.  A lens was then injected into the capsular bag.  The remaining viscoelastic was aspirated.   Wounds were hydrated with balanced salt solution.  The anterior chamber was inflated to a physiologic pressure  with balanced salt solution.  No wound leaks were noted. Vigamox 0.2 ml of a 1mg  per ml solution was injected into the anterior chamber for a dose of 0.2 mg of intracameral antibiotic at the completion of the case.   Timolol and Brimonidine drops were applied to the eye.  The patient was taken to the recovery room in stable condition without complications of anesthesia or surgery.  [name] 08/27/2019, 9:45 AM

## 2019-08-27 NOTE — Transfer of Care (Signed)
Immediate Anesthesia Transfer of Care Note  Patient: Erica Oneill  Procedure(s) Performed: CATARACT EXTRACTION PHACO AND INTRAOCULAR LENS PLACEMENT (IOC) LEFT (Left Eye)  Patient Location: PACU  Anesthesia Type: MAC  Level of Consciousness: awake, alert  and patient cooperative  Airway and Oxygen Therapy: Patient Spontanous Breathing and Patient connected to supplemental oxygen  Post-op Assessment: Post-op Vital signs reviewed, Patient's Cardiovascular Status Stable, Respiratory Function Stable, Patent Airway and No signs of Nausea or vomiting  Post-op Vital Signs: Reviewed and stable  Complications: No apparent anesthesia complications

## 2019-08-27 NOTE — Anesthesia Procedure Notes (Signed)
Procedure Name: MAC Performed by: Tanner Vigna, CRNA Pre-anesthesia Checklist: Patient identified, Emergency Drugs available, Suction available, Timeout performed and Patient being monitored Patient Re-evaluated:Patient Re-evaluated prior to induction Oxygen Delivery Method: Nasal cannula Placement Confirmation: positive ETCO2       

## 2019-08-27 NOTE — H&P (Signed)

## 2019-08-27 NOTE — Anesthesia Postprocedure Evaluation (Signed)
Anesthesia Post Note  Patient: Erica Oneill  Procedure(s) Performed: CATARACT EXTRACTION PHACO AND INTRAOCULAR LENS PLACEMENT (IOC) LEFT (Left Eye)     Patient location during evaluation: PACU Anesthesia Type: MAC Level of consciousness: awake and alert Pain management: pain level controlled Vital Signs Assessment: post-procedure vital signs reviewed and stable Respiratory status: spontaneous breathing, nonlabored ventilation, respiratory function stable and patient connected to nasal cannula oxygen Cardiovascular status: stable and blood pressure returned to baseline Postop Assessment: no apparent nausea or vomiting Anesthetic complications: no    Shamika Pedregon A  Malvin Morrish

## 2019-08-28 ENCOUNTER — Encounter: Payer: Self-pay | Admitting: *Deleted

## 2019-09-10 ENCOUNTER — Encounter: Payer: Self-pay | Admitting: Ophthalmology

## 2019-09-15 ENCOUNTER — Other Ambulatory Visit: Payer: Self-pay

## 2019-09-15 ENCOUNTER — Other Ambulatory Visit
Admission: RE | Admit: 2019-09-15 | Discharge: 2019-09-15 | Disposition: A | Discharge status: Home / Self Care | Payer: Medicare Other | Source: Ambulatory Visit | Attending: Ophthalmology | Admitting: Ophthalmology

## 2019-09-15 DIAGNOSIS — H2511 Age-related nuclear cataract, right eye: Secondary | ICD-10-CM | POA: Diagnosis not present

## 2019-09-15 DIAGNOSIS — Z20822 Contact with and (suspected) exposure to covid-19: Secondary | ICD-10-CM | POA: Insufficient documentation

## 2019-09-15 DIAGNOSIS — Z01812 Encounter for preprocedural laboratory examination: Secondary | ICD-10-CM | POA: Insufficient documentation

## 2019-09-15 DIAGNOSIS — I10 Essential (primary) hypertension: Secondary | ICD-10-CM | POA: Diagnosis not present

## 2019-09-15 LAB — SARS CORONAVIRUS 2 (TAT 6-24 HRS): SARS Coronavirus 2: NEGATIVE

## 2019-09-15 NOTE — Discharge Instructions (Signed)

## 2019-09-17 ENCOUNTER — Encounter: Payer: Self-pay | Admitting: Ophthalmology

## 2019-09-17 ENCOUNTER — Ambulatory Visit: Payer: Medicare Other | Admitting: Anesthesiology

## 2019-09-17 ENCOUNTER — Other Ambulatory Visit: Payer: Self-pay

## 2019-09-17 ENCOUNTER — Encounter
Admission: RE | Disposition: A | Discharge status: Home / Self Care | Payer: Self-pay | Source: Home / Self Care | Attending: Ophthalmology

## 2019-09-17 ENCOUNTER — Ambulatory Visit
Admission: RE | Admit: 2019-09-17 | Discharge: 2019-09-17 | Disposition: A | Discharge status: Home / Self Care | Payer: Medicare Other | Attending: Ophthalmology | Admitting: Ophthalmology

## 2019-09-17 DIAGNOSIS — I10 Essential (primary) hypertension: Secondary | ICD-10-CM | POA: Insufficient documentation

## 2019-09-17 DIAGNOSIS — Z01812 Encounter for preprocedural laboratory examination: Secondary | ICD-10-CM | POA: Insufficient documentation

## 2019-09-17 DIAGNOSIS — H2511 Age-related nuclear cataract, right eye: Secondary | ICD-10-CM | POA: Insufficient documentation

## 2019-09-17 DIAGNOSIS — Z20822 Contact with and (suspected) exposure to covid-19: Secondary | ICD-10-CM | POA: Insufficient documentation

## 2019-09-17 HISTORY — PX: CATARACT EXTRACTION W/PHACO: SHX586

## 2019-09-17 SURGERY — PHACOEMULSIFICATION, CATARACT, WITH IOL INSERTION
Anesthesia: Monitor Anesthesia Care | Site: Eye | Laterality: Right

## 2019-09-17 MED ORDER — LACTATED RINGERS IV SOLN: 10.0000 mL/h | INTRAVENOUS | Status: DC

## 2019-09-17 MED ORDER — ACETAMINOPHEN 160 MG/5ML PO SOLN: 325.0000 mg | Freq: Once | ORAL | Status: DC

## 2019-09-17 MED ORDER — EPINEPHRINE PF 1 MG/ML IJ SOLN
INTRAOCULAR | Status: DC | PRN
  Administered 2019-09-17: 38 mL via OPHTHALMIC

## 2019-09-17 MED ORDER — MOXIFLOXACIN HCL 0.5 % OP SOLN
OPHTHALMIC | Status: DC | PRN
  Administered 2019-09-17: 0.2 mL via OPHTHALMIC

## 2019-09-17 MED ORDER — TETRACAINE HCL 0.5 % OP SOLN
1.0000 [drp] | OPHTHALMIC | Status: DC | PRN
  Administered 2019-09-17 (×3): 1 [drp] via OPHTHALMIC

## 2019-09-17 MED ORDER — MOXIFLOXACIN HCL 0.5 % OP SOLN
1.0000 [drp] | OPHTHALMIC | Status: DC | PRN
  Administered 2019-09-17 (×3): 1 [drp] via OPHTHALMIC

## 2019-09-17 MED ORDER — NA HYALUR & NA CHOND-NA HYALUR 0.4-0.35 ML IO KIT
PACK | INTRAOCULAR | Status: DC | PRN
  Administered 2019-09-17: 1 mL via INTRAOCULAR

## 2019-09-17 MED ORDER — MIDAZOLAM HCL 2 MG/2ML IJ SOLN
INTRAMUSCULAR | Status: DC | PRN
  Administered 2019-09-17: 2 mg via INTRAVENOUS

## 2019-09-17 MED ORDER — BRIMONIDINE TARTRATE-TIMOLOL 0.2-0.5 % OP SOLN
OPHTHALMIC | Status: DC | PRN
  Administered 2019-09-17: 1 [drp] via OPHTHALMIC

## 2019-09-17 MED ORDER — ACETAMINOPHEN 325 MG PO TABS: 325.0000 mg | ORAL_TABLET | Freq: Once | ORAL | Status: DC

## 2019-09-17 MED ORDER — ARMC OPHTHALMIC DILATING DROPS
1.0000 "application " | OPHTHALMIC | Status: DC | PRN
  Administered 2019-09-17 (×3): 1 via OPHTHALMIC

## 2019-09-17 SURGICAL SUPPLY — 28 items
CANNULA ANT/CHMB 27G (MISCELLANEOUS) ×1 IMPLANT
CANNULA ANT/CHMB 27GA (MISCELLANEOUS) ×2 IMPLANT
GLOVE SURG LX 7.5 STRW (GLOVE) ×2
GLOVE SURG LX STRL 7.5 STRW (GLOVE) ×1 IMPLANT
GLOVE SURG TRIUMPH 8.0 PF LTX (GLOVE) ×2 IMPLANT
GOWN STRL REUS W/ TWL LRG LVL3 (GOWN DISPOSABLE) ×2 IMPLANT
GOWN STRL REUS W/TWL LRG LVL3 (GOWN DISPOSABLE) ×4
LENS IOL TECNIS ITEC 21.0 (Intraocular Lens) ×1 IMPLANT
MARKER SKIN DUAL TIP RULER LAB (MISCELLANEOUS) ×2 IMPLANT
NDL CAPSULORHEX 25GA (NEEDLE) ×1 IMPLANT
NDL FILTER BLUNT 18X1 1/2 (NEEDLE) ×2 IMPLANT
NDL RETROBULBAR .5 NSTRL (NEEDLE) IMPLANT
NEEDLE CAPSULORHEX 25GA (NEEDLE) ×2 IMPLANT
NEEDLE FILTER BLUNT 18X 1/2SAF (NEEDLE) ×2
NEEDLE FILTER BLUNT 18X1 1/2 (NEEDLE) ×2 IMPLANT
PACK CATARACT BRASINGTON (MISCELLANEOUS) ×2 IMPLANT
PACK EYE AFTER SURG (MISCELLANEOUS) ×2 IMPLANT
PACK OPTHALMIC (MISCELLANEOUS) ×2 IMPLANT
RING MALYGIN 7.0 (MISCELLANEOUS) IMPLANT
SOLUTION OPHTHALMIC SALT (MISCELLANEOUS) ×2 IMPLANT
SUT ETHILON 10-0 CS-B-6CS-B-6 (SUTURE)
SUT VICRYL  9 0 (SUTURE)
SUT VICRYL 9 0 (SUTURE) IMPLANT
SUTURE EHLN 10-0 CS-B-6CS-B-6 (SUTURE) IMPLANT
SYR 3ML LL SCALE MARK (SYRINGE) ×4 IMPLANT
SYR TB 1ML LUER SLIP (SYRINGE) ×2 IMPLANT
WATER STERILE IRR 250ML POUR (IV SOLUTION) ×2 IMPLANT
WIPE NON LINTING 3.25X3.25 (MISCELLANEOUS) ×2 IMPLANT

## 2019-09-17 NOTE — Op Note (Signed)
LOCATION:  [facility]   PREOPERATIVE DIAGNOSIS:    Nuclear sclerotic cataract right eye. H25.11   POSTOPERATIVE DIAGNOSIS:  Nuclear sclerotic cataract right eye.     PROCEDURE:  Phacoemusification with posterior chamber intraocular lens placement of the right eye   ULTRASOUND TIME: Procedure(s) with comments: CATARACT EXTRACTION PHACO AND INTRAOCULAR LENS PLACEMENT (IOC) RIGHT (Right) - 4.83 0:42.7 11.3%  LENS:   Implant Name Type Inv. Item Serial No. Manufacturer Lot No. LRB No. Used Action  LENS IOL DIOP 21.0 - [id number] Intraocular Lens LENS IOL DIOP 21.0 [id number] AMO  Right 1 Implanted         SURGEON:  Deirdre Evener, MD   ANESTHESIA:  Topical with tetracaine drops     COMPLICATIONS:  None.   DESCRIPTION OF PROCEDURE:  The patient was identified in the holding room and transported to the operating room and placed in the supine position under the operating microscope.  The right eye was identified as the operative eye and it was prepped and draped in the usual sterile ophthalmic fashion.   A 1 millimeter clear-corneal paracentesis was made at the 12:00 position.  0.5 ml of preservative-free 1% lidocaine was injected into the anterior chamber. The anterior chamber was filled with Viscoat viscoelastic.  A 2.4 millimeter keratome was used to make a near-clear corneal incision at the 9:00 position.  A curvilinear capsulorrhexis was made with a cystotome and capsulorrhexis forceps.  Balanced salt solution was used to hydrodissect and hydrodelineate the nucleus.   Phacoemulsification was then used in stop and chop fashion to remove the lens nucleus and epinucleus.  The remaining cortex was then removed using the irrigation and aspiration handpiece. Provisc was then placed into the capsular bag to distend it for lens placement.  A lens was then injected into the capsular bag.  The remaining viscoelastic was aspirated.   Wounds were hydrated with balanced salt  solution.  The anterior chamber was inflated to a physiologic pressure with balanced salt solution.  No wound leaks were noted. Vigamox 0.2 ml of a 1mg  per ml solution was injected into the anterior chamber for a dose of 0.2 mg of intracameral antibiotic at the completion of the case.   Timolol and Brimonidine drops were applied to the eye.  The patient was taken to the recovery room in stable condition without complications of anesthesia or surgery.   Dorice Stiggers 09/17/2019, 11:48 AM

## 2019-09-17 NOTE — Anesthesia Preprocedure Evaluation (Signed)
Anesthesia Evaluation  Patient identified by MRN, date of birth, ID band Patient awake    Reviewed: Allergy & Precautions, H&P , NPO status , Patient's Chart, lab work & pertinent test results  History of Anesthesia Complications Negative for: history of anesthetic complications  Airway Mallampati: II  TM Distance: >3 FB Neck ROM: full    Dental no notable dental hx.    Pulmonary    Pulmonary exam normal breath sounds clear to auscultation       Cardiovascular hypertension, (-) angina(-) DOE Normal cardiovascular exam+ dysrhythmias  Rhythm:regular Rate:Normal   H/o pericarditis 06/2018   Neuro/Psych PSYCHIATRIC DISORDERS Anxiety    GI/Hepatic neg GERD  ,  Endo/Other    Renal/GU      Musculoskeletal   Abdominal   Peds  Hematology   Anesthesia Other Findings   Reproductive/Obstetrics                             Anesthesia Physical  Anesthesia Plan  ASA: II  Anesthesia Plan: MAC   Post-op Pain Management:    Induction: Intravenous  PONV Risk Score and Plan: 2 and Treatment may vary due to age or medical condition, TIVA and Midazolam  Airway Management Planned: Nasal Cannula  Additional Equipment:   Intra-op Plan:   Post-operative Plan:   Informed Consent: I have reviewed the patients History and Physical, chart, labs and discussed the procedure including the risks, benefits and alternatives for the proposed anesthesia with the patient or authorized representative who has indicated his/her understanding and acceptance.     Dental Advisory Given  Plan Discussed with: CRNA  Anesthesia Plan Comments: (No Fentanyl)        Anesthesia Quick Evaluation

## 2019-09-17 NOTE — Transfer of Care (Signed)
Immediate Anesthesia Transfer of Care Note  Patient: Erica Oneill  Procedure(s) Performed: CATARACT EXTRACTION PHACO AND INTRAOCULAR LENS PLACEMENT (IOC) RIGHT (Right Eye)  Patient Location: PACU  Anesthesia Type: MAC  Level of Consciousness: awake, alert  and patient cooperative  Airway and Oxygen Therapy: Patient Spontanous Breathing and Patient connected to supplemental oxygen  Post-op Assessment: Post-op Vital signs reviewed, Patient's Cardiovascular Status Stable, Respiratory Function Stable, Patent Airway and No signs of Nausea or vomiting  Post-op Vital Signs: Reviewed and stable  Complications: No apparent anesthesia complications

## 2019-09-17 NOTE — Anesthesia Postprocedure Evaluation (Signed)
Anesthesia Post Note  Patient: Erica Oneill  Procedure(s) Performed: CATARACT EXTRACTION PHACO AND INTRAOCULAR LENS PLACEMENT (IOC) RIGHT (Right Eye)     Patient location during evaluation: PACU Anesthesia Type: MAC Level of consciousness: awake and alert and oriented Pain management: satisfactory to patient Vital Signs Assessment: post-procedure vital signs reviewed and stable Respiratory status: spontaneous breathing, nonlabored ventilation and respiratory function stable Cardiovascular status: blood pressure returned to baseline and stable Postop Assessment: Adequate PO intake and No signs of nausea or vomiting Anesthetic complications: no    Raliegh Ip

## 2019-09-17 NOTE — H&P (Signed)

## 2019-09-18 ENCOUNTER — Encounter: Payer: Self-pay | Admitting: *Deleted

## 2019-12-25 DIAGNOSIS — U071 COVID-19: Secondary | ICD-10-CM

## 2019-12-25 HISTORY — DX: COVID-19: U07.1

## 2020-01-11 ENCOUNTER — Inpatient Hospital Stay (HOSPITAL_COMMUNITY)
Admission: EM | Admit: 2020-01-11 | Discharge: 2020-01-13 | DRG: 871 | Disposition: A | Discharge status: Home / Self Care | Payer: Medicare Other | Attending: Family Medicine | Admitting: Family Medicine

## 2020-01-11 ENCOUNTER — Emergency Department (HOSPITAL_COMMUNITY): Payer: Medicare Other

## 2020-01-11 ENCOUNTER — Encounter (HOSPITAL_COMMUNITY): Payer: Self-pay | Admitting: Emergency Medicine

## 2020-01-11 ENCOUNTER — Other Ambulatory Visit: Payer: Self-pay

## 2020-01-11 DIAGNOSIS — R509 Fever, unspecified: Secondary | ICD-10-CM | POA: Diagnosis present

## 2020-01-11 DIAGNOSIS — Z886 Allergy status to analgesic agent status: Secondary | ICD-10-CM | POA: Diagnosis not present

## 2020-01-11 DIAGNOSIS — Z6833 Body mass index (BMI) 33.0-33.9, adult: Secondary | ICD-10-CM

## 2020-01-11 DIAGNOSIS — U071 COVID-19: Secondary | ICD-10-CM | POA: Diagnosis present

## 2020-01-11 DIAGNOSIS — R112 Nausea with vomiting, unspecified: Secondary | ICD-10-CM | POA: Diagnosis present

## 2020-01-11 DIAGNOSIS — A4189 Other specified sepsis: Secondary | ICD-10-CM | POA: Diagnosis present

## 2020-01-11 DIAGNOSIS — I471 Supraventricular tachycardia: Secondary | ICD-10-CM | POA: Diagnosis present

## 2020-01-11 DIAGNOSIS — Z91013 Allergy to seafood: Secondary | ICD-10-CM | POA: Diagnosis not present

## 2020-01-11 DIAGNOSIS — Z8249 Family history of ischemic heart disease and other diseases of the circulatory system: Secondary | ICD-10-CM

## 2020-01-11 DIAGNOSIS — I10 Essential (primary) hypertension: Secondary | ICD-10-CM | POA: Diagnosis present

## 2020-01-11 DIAGNOSIS — J129 Viral pneumonia, unspecified: Secondary | ICD-10-CM | POA: Diagnosis present

## 2020-01-11 DIAGNOSIS — A419 Sepsis, unspecified organism: Secondary | ICD-10-CM

## 2020-01-11 DIAGNOSIS — R197 Diarrhea, unspecified: Secondary | ICD-10-CM | POA: Diagnosis present

## 2020-01-11 DIAGNOSIS — M2550 Pain in unspecified joint: Secondary | ICD-10-CM | POA: Diagnosis present

## 2020-01-11 DIAGNOSIS — J189 Pneumonia, unspecified organism: Secondary | ICD-10-CM | POA: Diagnosis not present

## 2020-01-11 DIAGNOSIS — Z7982 Long term (current) use of aspirin: Secondary | ICD-10-CM

## 2020-01-11 DIAGNOSIS — E669 Obesity, unspecified: Secondary | ICD-10-CM | POA: Diagnosis present

## 2020-01-11 DIAGNOSIS — Z885 Allergy status to narcotic agent status: Secondary | ICD-10-CM | POA: Diagnosis not present

## 2020-01-11 DIAGNOSIS — R0602 Shortness of breath: Secondary | ICD-10-CM

## 2020-01-11 DIAGNOSIS — I309 Acute pericarditis, unspecified: Secondary | ICD-10-CM | POA: Diagnosis present

## 2020-01-11 DIAGNOSIS — Z882 Allergy status to sulfonamides status: Secondary | ICD-10-CM | POA: Diagnosis not present

## 2020-01-11 DIAGNOSIS — J069 Acute upper respiratory infection, unspecified: Secondary | ICD-10-CM | POA: Insufficient documentation

## 2020-01-11 LAB — CREATININE, SERUM
Creatinine, Ser: 1.08 mg/dL — ABNORMAL HIGH (ref 0.44–1.00)
GFR calc Af Amer: >60 mL/min (ref >60)
GFR calc non Af Amer: 53 mL/min — ABNORMAL LOW (ref >60)

## 2020-01-11 LAB — CBC
HCT: 44.1 % (ref 36.0–46.0)
Hemoglobin: 14.1 g/dL (ref 12.0–15.0)
MCH: 31.3 pg (ref 26.0–34.0)
MCHC: 32 g/dL (ref 30.0–36.0)
MCV: 98 fL (ref 80.0–100.0)
Platelets: 165 10*3/uL (ref 150–400)
RBC: 4.5 MIL/uL (ref 3.87–5.11)
RDW: 12.4 % (ref 11.5–15.5)
WBC: 8 10*3/uL (ref 4.0–10.5)
nRBC: 0 % (ref 0.0–0.2)

## 2020-01-11 LAB — CBC WITH DIFFERENTIAL/PLATELET
Abs Immature Granulocytes: 0.03 10*3/uL (ref 0.00–0.07)
Basophils Absolute: 0 10*3/uL (ref 0.0–0.1)
Basophils Relative: 0 %
Eosinophils Absolute: 0 10*3/uL (ref 0.0–0.5)
Eosinophils Relative: 0 %
HCT: 46.2 % — ABNORMAL HIGH (ref 36.0–46.0)
Hemoglobin: 14.7 g/dL (ref 12.0–15.0)
Immature Granulocytes: 0 %
Lymphocytes Relative: 20 %
Lymphs Abs: 1.5 10*3/uL (ref 0.7–4.0)
MCH: 30.9 pg (ref 26.0–34.0)
MCHC: 31.8 g/dL (ref 30.0–36.0)
MCV: 97.3 fL (ref 80.0–100.0)
Monocytes Absolute: 0.9 10*3/uL (ref 0.1–1.0)
Monocytes Relative: 12 %
Neutro Abs: 5.2 10*3/uL (ref 1.7–7.7)
Neutrophils Relative %: 68 %
Platelets: 168 10*3/uL (ref 150–400)
RBC: 4.75 MIL/uL (ref 3.87–5.11)
RDW: 12.3 % (ref 11.5–15.5)
WBC: 7.7 10*3/uL (ref 4.0–10.5)
nRBC: 0 % (ref 0.0–0.2)

## 2020-01-11 LAB — COMPREHENSIVE METABOLIC PANEL
ALT: 83 U/L — ABNORMAL HIGH (ref 0–44)
AST: 79 U/L — ABNORMAL HIGH (ref 15–41)
Albumin: 3.8 g/dL (ref 3.5–5.0)
Alkaline Phosphatase: 49 U/L (ref 38–126)
Anion gap: 13 (ref 5–15)
BUN: 14 mg/dL (ref 8–23)
CO2: 24 mmol/L (ref 22–32)
Calcium: 8.7 mg/dL — ABNORMAL LOW (ref 8.9–10.3)
Chloride: 102 mmol/L (ref 98–111)
Creatinine, Ser: 0.83 mg/dL (ref 0.44–1.00)
GFR calc Af Amer: >60 mL/min (ref >60)
GFR calc non Af Amer: >60 mL/min (ref >60)
Glucose, Bld: 108 mg/dL — ABNORMAL HIGH (ref 70–99)
Potassium: 3.7 mmol/L (ref 3.5–5.1)
Sodium: 139 mmol/L (ref 135–145)
Total Bilirubin: 0.4 mg/dL (ref 0.3–1.2)
Total Protein: 7.5 g/dL (ref 6.5–8.1)

## 2020-01-11 LAB — SARS CORONAVIRUS 2 BY RT PCR (HOSPITAL ORDER, PERFORMED IN ~~LOC~~ HOSPITAL LAB): SARS Coronavirus 2: POSITIVE — AB

## 2020-01-11 LAB — TROPONIN I (HIGH SENSITIVITY): Troponin I (High Sensitivity): 4 ng/L (ref <18)

## 2020-01-11 LAB — TYPE AND SCREEN
ABO/RH(D): A POS
Antibody Screen: NEGATIVE

## 2020-01-11 LAB — PROCALCITONIN: Procalcitonin: <0.1 ng/mL

## 2020-01-11 LAB — HEPATITIS B SURFACE ANTIGEN: Hepatitis B Surface Ag: NONREACTIVE

## 2020-01-11 LAB — BRAIN NATRIURETIC PEPTIDE: B Natriuretic Peptide: 25.8 pg/mL (ref 0.0–100.0)

## 2020-01-11 LAB — D-DIMER, QUANTITATIVE: D-Dimer, Quant: 0.74 ug/mL-FEU — ABNORMAL HIGH (ref 0.00–0.50)

## 2020-01-11 LAB — FIBRINOGEN: Fibrinogen: 468 mg/dL (ref 210–475)

## 2020-01-11 LAB — LACTATE DEHYDROGENASE: LDH: 294 U/L — ABNORMAL HIGH (ref 98–192)

## 2020-01-11 LAB — FERRITIN: Ferritin: 1878 ng/mL — ABNORMAL HIGH (ref 11–307)

## 2020-01-11 LAB — C-REACTIVE PROTEIN: CRP: 1.9 mg/dL — ABNORMAL HIGH (ref <1.0)

## 2020-01-11 MED ORDER — ACETAMINOPHEN 650 MG RE SUPP: 650.0000 mg | Freq: Four times a day (QID) | RECTAL | Status: DC | PRN

## 2020-01-11 MED ORDER — ZINC SULFATE 220 (50 ZN) MG PO CAPS
220.0000 mg | ORAL_CAPSULE | Freq: Every day | ORAL | Status: DC
  Administered 2020-01-11 – 2020-01-13 (×3): 220 mg via ORAL
  Filled 2020-01-11 (×3): qty 1

## 2020-01-11 MED ORDER — SODIUM CHLORIDE 0.9 % IV SOLN: 200.0000 mg | Freq: Once | INTRAVENOUS | Status: DC

## 2020-01-11 MED ORDER — SODIUM CHLORIDE 0.9 % IV SOLN
100.0000 mg | Freq: Every day | INTRAVENOUS | Status: DC
  Administered 2020-01-12 – 2020-01-13 (×2): 100 mg via INTRAVENOUS
  Filled 2020-01-11 (×2): qty 20

## 2020-01-11 MED ORDER — LOPERAMIDE HCL 2 MG PO CAPS: 2.0000 mg | ORAL_CAPSULE | ORAL | Status: DC | PRN

## 2020-01-11 MED ORDER — ENOXAPARIN SODIUM 40 MG/0.4ML ~~LOC~~ SOLN
40.0000 mg | SUBCUTANEOUS | Status: DC
  Administered 2020-01-11 – 2020-01-13 (×3): 40 mg via SUBCUTANEOUS
  Filled 2020-01-11 (×3): qty 0.4

## 2020-01-11 MED ORDER — SODIUM CHLORIDE 0.9 % IV SOLN
100.0000 mg | INTRAVENOUS | Status: AC
  Administered 2020-01-11 (×2): 100 mg via INTRAVENOUS
  Filled 2020-01-11 (×2): qty 20

## 2020-01-11 MED ORDER — ONDANSETRON HCL 4 MG/2ML IJ SOLN
4.0000 mg | Freq: Once | INTRAMUSCULAR | Status: AC
  Administered 2020-01-11: 4 mg via INTRAVENOUS
  Filled 2020-01-11: qty 2

## 2020-01-11 MED ORDER — NEBIVOLOL HCL 5 MG PO TABS
5.0000 mg | ORAL_TABLET | Freq: Every day | ORAL | Status: DC
  Administered 2020-01-11 – 2020-01-13 (×3): 5 mg via ORAL
  Filled 2020-01-11 (×3): qty 1

## 2020-01-11 MED ORDER — DEXAMETHASONE SODIUM PHOSPHATE 10 MG/ML IJ SOLN
6.0000 mg | INTRAMUSCULAR | Status: DC
  Administered 2020-01-11 – 2020-01-13 (×3): 6 mg via INTRAVENOUS
  Filled 2020-01-11 (×3): qty 1

## 2020-01-11 MED ORDER — ASCORBIC ACID 500 MG PO TABS
500.0000 mg | ORAL_TABLET | Freq: Every day | ORAL | Status: DC
  Administered 2020-01-11 – 2020-01-13 (×3): 500 mg via ORAL
  Filled 2020-01-11 (×3): qty 1

## 2020-01-11 MED ORDER — REMDESIVIR 100 MG IV SOLR
100.0000 mg | Freq: Every day | INTRAVENOUS | Status: DC
Start: 2020-01-12 — End: 2020-01-11

## 2020-01-11 MED ORDER — ACETAMINOPHEN 325 MG PO TABS
650.0000 mg | ORAL_TABLET | Freq: Four times a day (QID) | ORAL | Status: DC | PRN
  Administered 2020-01-11: 650 mg via ORAL
  Filled 2020-01-11: qty 2

## 2020-01-11 MED ORDER — ACETAMINOPHEN 500 MG PO TABS
1000.0000 mg | ORAL_TABLET | Freq: Once | ORAL | Status: AC
  Administered 2020-01-11: 500 mg via ORAL
  Filled 2020-01-11: qty 2

## 2020-01-11 MED ORDER — LEVOFLOXACIN IN D5W 750 MG/150ML IV SOLN: 750.0000 mg | INTRAVENOUS | Status: DC

## 2020-01-11 NOTE — ED Notes (Signed)
Spoke with bed placement who states beds are being cleaned upstairs now and patient should have a room shortly.

## 2020-01-11 NOTE — ED Provider Notes (Signed)
[facility]-EMERGENCY DEPT Provider Note   CSN: [id number] Arrival date & time: 01/11/20  0419     History Chief Complaint  Patient presents with  . Fever    IZZIE Oneill is a 64 y.o. female.  Patient presents to the emergency department with a chief complaint of cough, vomiting, and diarrhea.  She was diagnosed with Covid-19 on Thursday (3 days ago).  She states that several of her other family members have it as well.  They were all in close quarters while at the beach recently.  She reports taking her temperature at home and it was 99.9.  In triage it is noted to be 103.2.  She states that she has been not able to tolerate any oral intake over the past couple of days.  She reports worsening cough.  She denies feeling short of breath.  She denies having any chest pain.  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  She states that she did not get the Covid vaccine.  The history is provided by the patient. No language interpreter was used.       Past Medical History:  Diagnosis Date  . Acute cystitis with hematuria 12/13/2018  . Arthralgia of multiple sites 08/28/2018  . Essential hypertension 09/03/2018  . Febrile illness 08/28/2018  . Hematuria 08/28/2018  . Hypertension   . Long-term use of aspirin therapy 08/06/2018  . Palpitations 09/03/2018  . Pericarditis 07/20/2018  . PSVT (paroxysmal supraventricular tachycardia) (HCC) 09/03/2018  . Rectal bleeding 08/28/2018  . Situational anxiety 08/28/2018  . Syncope 07/20/2018    Patient Active Problem List   Diagnosis Date Noted  . Acute cystitis with hematuria 12/13/2018  . Essential hypertension 09/03/2018  . Palpitations 09/03/2018  . PSVT (paroxysmal supraventricular tachycardia) (HCC) 09/03/2018  . Arthralgia of multiple sites 08/28/2018  . Hematuria 08/28/2018  . Rectal bleeding 08/28/2018  . Situational anxiety 08/28/2018  . Febrile illness 08/28/2018  . Long-term use of aspirin  therapy 08/06/2018  . Pericarditis 07/20/2018  . Syncope 07/20/2018    Past Surgical History:  Procedure Laterality Date  . ABDOMINAL HYSTERECTOMY    . CATARACT EXTRACTION W/PHACO Left 08/27/2019   Procedure: CATARACT EXTRACTION PHACO AND INTRAOCULAR LENS PLACEMENT (IOC) LEFT;  Surgeon: Lockie Mola, MD;  Location: [facility];  Service: Ophthalmology;  Laterality: Left;  6.08 0:53.6 11.4%  . CATARACT EXTRACTION W/PHACO Right 09/17/2019   Procedure: CATARACT EXTRACTION PHACO AND INTRAOCULAR LENS PLACEMENT (IOC) RIGHT;  Surgeon: Lockie Mola, MD;  Location: [facility];  Service: Ophthalmology;  Laterality: Right;  4.83 0:42.7 11.3%  . TORN MENISCUS IN LEFT KNEE Left      OB History   No obstetric history on file.     Family History  Problem Relation Age of Onset  . Hypertension Mother   . Heart attack Mother 36  . Cancer Father   . Heart attack Maternal Grandfather 26    Social History   Tobacco Use  . Smoking status: Never Smoker  . Smokeless tobacco: Never Used  Vaping Use  . Vaping Use: Never used  Substance Use Topics  . Alcohol use: Not Currently  . Drug use: Never    Home Medications Prior to Admission medications   Medication Sig Start Date End Date Taking? Authorizing Provider  Ascorbic Acid (VITAMIN C PO) Take by mouth daily.    [provider]  Cholecalciferol (VITAMIN D3 PO) Take by mouth daily.    [provider]  nebivolol (BYSTOLIC) 5 MG tablet Take by mouth. 03/25/19   [provider]    Allergies    Lidocaine, Rocephin [ceftriaxone], Fentanyl, Ibuprofen, Meloxicam, Morphine and related, Sulfamethoxazole-trimethoprim, and Other  Review of Systems   Review of Systems  All other systems reviewed and are negative.   Physical Exam Updated Vital Signs BP 112/78 (BP Location: Left Arm)   Pulse (!) 112   Temp (!) 103.2 F (39.6 C) (Oral)   Resp (!) 24   Ht 5\' 5"  (1.651 m)   Wt 90.7 kg    SpO2 95%   BMI 33.28 kg/m   Physical Exam Vitals and nursing note reviewed.  Constitutional:      General: She is not in acute distress.    Appearance: She is well-developed.     Comments: Vomiting  HENT:     Head: Normocephalic and atraumatic.  Eyes:     Conjunctiva/sclera: Conjunctivae normal.  Cardiovascular:     Rate and Rhythm: Regular rhythm. Tachycardia present.     Heart sounds: No murmur heard.   Pulmonary:     Effort: Pulmonary effort is normal. No respiratory distress.     Breath sounds: Normal breath sounds.     Comments: Clear to auscultation bilaterally Abdominal:     Palpations: Abdomen is soft.     Tenderness: There is no abdominal tenderness.  Musculoskeletal:        General: Normal range of motion.     Cervical back: Neck supple.  Skin:    General: Skin is warm and dry.  Neurological:     Mental Status: She is alert and oriented to person, place, and time.  Psychiatric:        Mood and Affect: Mood normal.        Behavior: Behavior normal.     ED Results / Procedures / Treatments   Labs (all labs ordered are listed, but only abnormal results are displayed) Labs Reviewed  CBC WITH DIFFERENTIAL/PLATELET - Abnormal; Notable for the following components:      Result Value   HCT 46.2 (*)    All other components within normal limits  BRAIN NATRIURETIC PEPTIDE  C-REACTIVE PROTEIN  COMPREHENSIVE METABOLIC PANEL  D-DIMER, QUANTITATIVE (NOT AT [facility])  FERRITIN  FIBRINOGEN  HEPATITIS B SURFACE ANTIGEN  LACTATE DEHYDROGENASE  PROCALCITONIN  HIV ANTIBODY (ROUTINE TESTING W REFLEX)  ABO/RH  TYPE AND SCREEN  TROPONIN I (HIGH SENSITIVITY)  TROPONIN I (HIGH SENSITIVITY)    EKG None  Radiology Portable chest 1 View  Result Date: 01/11/2020 CLINICAL DATA:  Initial evaluation for worsening cough and fever. Recent history of COVID diagnosis. EXAM: PORTABLE CHEST 1 VIEW COMPARISON:  Prior radiograph from 06/30/2019. FINDINGS: Transverse heart size at the  upper limits of normal. Mediastinal silhouette within normal limits. Lungs normally inflated. Scattered peribronchial thickening with interstitial opacity seen involving primarily the mid and lower lungs, slightly worse on the left. Findings most likely related to history of recently diagnosed viral pneumonitis. Superimposed streaky bibasilar atelectatic changes. No frank airspace consolidation. No visible pleural effusion. No pneumothorax. No acute osseous finding. IMPRESSION: Scattered peribronchial thickening with interstitial opacities involving primarily the mid and lower lungs, slightly worse on the left. Findings most likely related to history of recently diagnosed viral pneumonitis. No frank airspace consolidation. Electronically Signed   By: 08/28/2019 M.D.   On: 01/11/2020 06:19    Procedures Procedures (including critical care time)  Medications Ordered in ED Medications  acetaminophen (TYLENOL) tablet 1,000 mg (has no  administration in time range)    ED Course  I have reviewed the triage vital signs and the nursing notes.  Pertinent labs & imaging results that were available during my care of the patient were reviewed by me and considered in my medical decision making (see chart for details).    MDM Rules/Calculators/A&P                          Erica Oneill was evaluated in Emergency Department on 01/11/2020 for the symptoms described in the history of present illness. She was evaluated in the context of the global COVID-19 pandemic, which necessitated consideration that the patient might be at risk for infection with the SARS-CoV-2 virus that causes COVID-19. Institutional protocols and algorithms that pertain to the evaluation of patients at risk for COVID-19 are in a state of rapid change based on information released by regulatory bodies including the CDC and federal and state organizations. These policies and algorithms were followed during the patient's care in the  ED.  Presents with cough, vomiting, and recent Covid diagnosis.  She was diagnosed 3 days ago.  She has been around multiple sick contacts with coronavirus.  She is noted to be febrile to 103.2, tachycardic to 112, having increased respiration rate at 24.  She is satting 95% on room air.  She does not feel short of breath.  She does not have chest pain.  She has had persistent vomiting, and cannot keep anything down.  She states that she feels exhausted.  Laboratory work-up is pending.  Chest x-ray pending.  6:30 AM Chest x-ray shows scattered peribronchial thickening and interstitial opacities worse on the left.  Patient will need admission to the hospital.  Will consult hospitalist.  Case discussed with Dr. Antionette Char, who will place some orders and have day team finish up admission.  Appreciate TRH for assisting.   Final Clinical Impression(s) / ED Diagnoses Final diagnoses:  Shortness of breath  COVID-19 virus infection    Rx / DC Orders ED Discharge Orders    None       Roxy Horseman, PA-C 01/11/20 0646    Ward, Layla Maw, DO 01/11/20 [phone]

## 2020-01-11 NOTE — H&P (Addendum)
History and Physical    Erica SiJanice B Selway ZOX:[id number] DOB: 12/01/1955 DOA: 01/11/2020  PCP: Hadley Penobbins, Robert A, MD  Patient coming from: Home  Chief Complaint: Fevers, cough  HPI: Erica Oneill is a 64 y.o. female with medical history significant of HTN presents to ED with cough, n/v, and diarrhea. Pt reportedly was diagnosed with COVID in the community 3 days prior to visit, although there is no documentation here to prove this. Pt is not vaccinated against COVID. Pt reports multiple sick contacts in her family who reportedly have COVID as well. Pt reportedly unable to tolerate much po recently. Worsening cough noted but without sob  On further questioning, pt attended family gathering at the beach 8 days prior to admit with multiple family members. Several days later, noted to have decreased sense of taste, nausea with vomiting, coughing, fevers. Pt reports 7 other family members were present, all but one was vaccinated. Thus far, three sick with COVID (all vaccinated - one post Moderna, two post Pfeizer).  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  ED Course: In the ED, pt found to be febrile to 103.64F with HR 101 and RR as high as 24. No leukocytosis. At the time of this dictation, COVID test pending as are inflammatory markers. CXR findings pos for PNA. Hospitalist consulted for consideration for admission  Review of Systems:  Review of Systems  Constitutional: Positive for fever and malaise/fatigue.  HENT: Negative for ear discharge, ear pain and nosebleeds.   Eyes: Negative for double vision, photophobia and discharge.  Respiratory: Positive for cough. Negative for shortness of breath.   Cardiovascular: Negative for palpitations, orthopnea and leg swelling.  Gastrointestinal: Positive for diarrhea, nausea and vomiting.  Genitourinary: Negative for frequency, hematuria and urgency.    Musculoskeletal: Negative for back pain, falls and neck pain.  Neurological: Negative for tingling, tremors, seizures and loss of consciousness.  Psychiatric/Behavioral: Negative for memory loss. The patient is not nervous/anxious.     Past Medical History:  Diagnosis Date  . Acute cystitis with hematuria 12/13/2018  . Arthralgia of multiple sites 08/28/2018  . Essential hypertension 09/03/2018  . Febrile illness 08/28/2018  . Hematuria 08/28/2018  . Hypertension   . Long-term use of aspirin therapy 08/06/2018  . Palpitations 09/03/2018  . Pericarditis 07/20/2018  . PSVT (paroxysmal supraventricular tachycardia) (HCC) 09/03/2018  . Rectal bleeding 08/28/2018  . Situational anxiety 08/28/2018  . Syncope 07/20/2018    Past Surgical History:  Procedure Laterality Date  . ABDOMINAL HYSTERECTOMY    . CATARACT EXTRACTION W/PHACO Left 08/27/2019   Procedure: CATARACT EXTRACTION PHACO AND INTRAOCULAR LENS PLACEMENT (IOC) LEFT;  Surgeon: Lockie MolaBrasington, Chadwick, MD;  [facility];  Service: Ophthalmology;  Laterality: Left;  6.08 0:53.6 11.4%  . CATARACT EXTRACTION W/PHACO Right 09/17/2019   Procedure: CATARACT EXTRACTION PHACO AND INTRAOCULAR LENS PLACEMENT (IOC) RIGHT;  Surgeon: Lockie MolaBrasington, Chadwick, MD;  [facility];  Service: Ophthalmology;  Laterality: Right;  4.83 0:42.7 11.3%  . TORN MENISCUS IN LEFT KNEE Left      reports that she has never smoked. She has never used smokeless tobacco. She reports previous alcohol use. She reports that she does not use drugs.  Allergies  Allergen Reactions  . Lidocaine Anaphylaxis  . Rocephin [Ceftriaxone] Anaphylaxis  . Fentanyl     Hypotension   . Ibuprofen     tachycardia  . Meloxicam  tachycardia  . Morphine And Related     Hypotension   . Sulfamethoxazole-Trimethoprim Diarrhea and Nausea And Vomiting       . Other Rash and Hypertension    seafood    Family History  Problem Relation Age of Onset  .  Hypertension Mother   . Heart attack Mother 4  . Cancer Father   . Heart attack Maternal Grandfather 49    Prior to Admission medications   Medication Sig Start Date End Date Taking? Authorizing Provider  Ascorbic Acid (VITAMIN C PO) Take 1 capsule by mouth daily.    Yes [provider]  aspirin EC 81 MG tablet Take 81 mg by mouth daily. Swallow whole.   Yes [provider]  Cholecalciferol (VITAMIN D3 PO) Take 1 capsule by mouth daily.    Yes [provider]  Multiple Vitamins-Minerals (ZINC PO) Take 1 tablet by mouth daily.   Yes [provider]  nebivolol (BYSTOLIC) 5 MG tablet Take 5 mg by mouth daily.  03/25/19  Yes [provider]  predniSONE (STERAPRED UNI-PAK 21 TAB) 10 MG (21) TBPK tablet Take 10 mg by mouth as directed. 6 day dose pack 01/09/20  Yes [provider]    Physical Exam: Vitals:   01/11/20 0430 01/11/20 0600 01/11/20 0700  BP: 112/78 (!) 144/86 115/69  Pulse: (!) 112 87 (!) 102  Resp: (!) 24 16 (!) 28  Temp: (!) 103.2 F (39.6 C)  99.7 F (37.6 C)  TempSrc: Oral  Oral  SpO2: 95% 93% 94%  Weight: 90.7 kg    Height: 5\' 5"  (1.651 m)      Constitutional: NAD, calm, comfortable, sitting in chair Vitals:   01/11/20 0430 01/11/20 0600 01/11/20 0700  BP: 112/78 (!) 144/86 115/69  Pulse: (!) 112 87 (!) 102  Resp: (!) 24 16 (!) 28  Temp: (!) 103.2 F (39.6 C)  99.7 F (37.6 C)  TempSrc: Oral  Oral  SpO2: 95% 93% 94%  Weight: 90.7 kg    Height: 5\' 5"  (1.651 m)     Eyes: PERRL, lids and conjunctivae normal ENMT: trachea midline, neck supple Respiratory: normal resp effort, no audible wheezing.  Cardiovascular: perfused, non edematous Abdomen: obese, nondistended Musculoskeletal: no clubbing / cyanosis. No joint deformity upper and lower extremities Skin: no rashes, lesions. No induration Neurologic: CN 2-12 grossly intact. Strength 5/5 in all 4.  Psychiatric: Normal judgment and insight. Alert and  oriented x 3. Normal mood.    Labs on Admission: I have personally reviewed following labs and imaging studies  CBC: Recent Labs  Lab 01/11/20 0532  WBC 7.7  NEUTROABS 5.2  HGB 14.7  HCT 46.2*  MCV 97.3  PLT 168   Basic Metabolic Panel: Recent Labs  Lab 01/11/20 0532  NA 139  K 3.7  CL 102  CO2 24  GLUCOSE 108*  BUN 14  CREATININE 0.83  CALCIUM 8.7*   GFR: Estimated Creatinine Clearance: 74.2 mL/min (by C-G formula based on SCr of 0.83 mg/dL). Liver Function Tests: Recent Labs  Lab 01/11/20 0532  AST 79*  ALT 83*  ALKPHOS 49  BILITOT 0.4  PROT 7.5  ALBUMIN 3.8   No results for input(s): LIPASE, AMYLASE in the last 168 hours. No results for input(s): AMMONIA in the last 168 hours. Coagulation Profile: No results for input(s): INR, PROTIME in the last 168 hours. Cardiac Enzymes: No results for input(s): CKTOTAL, CKMB, CKMBINDEX, TROPONINI in the last 168 hours. BNP (last 3 results) No  results for input(s): PROBNP in the last 8760 hours. HbA1C: No results for input(s): HGBA1C in the last 72 hours. CBG: No results for input(s): GLUCAP in the last 168 hours. Lipid Profile: No results for input(s): CHOL, HDL, LDLCALC, TRIG, CHOLHDL, LDLDIRECT in the last 72 hours. Thyroid Function Tests: No results for input(s): TSH, T4TOTAL, FREET4, T3FREE, THYROIDAB in the last 72 hours. Anemia Panel: No results for input(s): VITAMINB12, FOLATE, FERRITIN, TIBC, IRON, RETICCTPCT in the last 72 hours. Urine analysis:    Component Value Date/Time   COLORURINE AMBER (A) 07/22/2018 1944   APPEARANCEUR CLEAR 07/22/2018 1944   LABSPEC 1.025 07/22/2018 1944   PHURINE 5.0 07/22/2018 1944   GLUCOSEU NEGATIVE 07/22/2018 1944   HGBUR NEGATIVE 07/22/2018 1944   BILIRUBINUR NEGATIVE 07/22/2018 1944   KETONESUR NEGATIVE 07/22/2018 1944   PROTEINUR 30 (A) 07/22/2018 1944   NITRITE NEGATIVE 07/22/2018 1944   LEUKOCYTESUR SMALL (A) 07/22/2018 1944   Sepsis Labs:  !!!!!!!!!!!!!!!!!!!!!!!!!!!!!!!!!!!!!!!!!!!! @LABRCNTIP (procalcitonin:4,lacticidven:4) )No results found for this or any previous visit (from the past 240 hour(s)).   Radiological Exams on Admission: Portable chest 1 View  Result Date: 01/11/2020 CLINICAL DATA:  Initial evaluation for worsening cough and fever. Recent history of COVID diagnosis. EXAM: PORTABLE CHEST 1 VIEW COMPARISON:  Prior radiograph from 06/30/2019. FINDINGS: Transverse heart size at the upper limits of normal. Mediastinal silhouette within normal limits. Lungs normally inflated. Scattered peribronchial thickening with interstitial opacity seen involving primarily the mid and lower lungs, slightly worse on the left. Findings most likely related to history of recently diagnosed viral pneumonitis. Superimposed streaky bibasilar atelectatic changes. No frank airspace consolidation. No visible pleural effusion. No pneumothorax. No acute osseous finding. IMPRESSION: Scattered peribronchial thickening with interstitial opacities involving primarily the mid and lower lungs, slightly worse on the left. Findings most likely related to history of recently diagnosed viral pneumonitis. No frank airspace consolidation. Electronically Signed   By: 08/28/2019 M.D.   On: 01/11/2020 06:19    EKG: Independently reviewed. Sinus tach  Assessment/Plan Principal Problem:   Sepsis due to pneumonia [facility]) Active Problems:   Essential hypertension   Long-term use of aspirin therapy   Febrile illness   Viral pneumonia  1. Pneumonia with sepsis present on admit 1. Presents with tachycardia, fevers, CXR findings worrisome for PNA 2. COVID test is pending, as are inflammatory markers. Pt reportedly was tested positive in community, no documentation of that here 3. Pt is not vaccinated against COVID 4. Procal pending 5. Will empirically cover with abx for PNA and f/u on covid tests. If COVID pos, would transition to steroids with  remdesivir 6. F/u CBC and cmp in AM. If covid pos, would f/u on daily inflammatory markers 2. HTN 1. BP stable at present 2. Not on BP meds prior to admit 3. Sinus tach 1. Likely secondary to presenting sepsis 4. Fevers 1. Likely secondary to presenting sepsis 2. Cont with PRN tylenol  UPDATE: COVID test confirmed positive. CRP is 1.9. Will d/c empiric abx, start IV decadron x 10 day course, and remdesivir x 5 day course. Inflammatory markers for the AM have been ordered  DVT prophylaxis: Lovenox subq  Code Status: Full Family Communication: Pt in room  Disposition Plan: Uncertain at this time  Consults called:  Admission status: Inpatient as would likely require greater than 2 midnight stay to treat presenting sepsis with PNA with IV antimicrobrial regimen, given high risk of mortality   [facility] MD [facility] Pager On Amion  If 7PM-7AM, please  contact night-coverage  01/11/2020, 7:52 AM

## 2020-01-11 NOTE — ED Notes (Signed)
Patient given pillow and repositioned back in bed.

## 2020-01-11 NOTE — ED Notes (Addendum)
Mr Erica Oneill met in lobby informed of COVID+ protocal for visitation and verbalize understand. Contact information exchange and he was informed to call if he had questions or concerns, which he denied after our conversation. Mr Rise was then asked to go to his car per policy and he would be contacted by staff and updated to current condition as well as contacted with any change in her condition at [phone] 

## 2020-01-11 NOTE — Progress Notes (Signed)
Pharmacy Note  64 y/o F reportedly tested positive in the community for COVID admitted with PNA.   O: GFR 74 ml/min  A/P: Levaquin 750 mg iv daily ordered is appropriate for indication and renal function. Pharmacy will sign off and follow remotely.   Luisa Hart, PharmD, BCPS

## 2020-01-11 NOTE — ED Notes (Signed)
Date and time results received: 01/11/20 9:41 AM  (use smartphrase ".now" to insert current time)  Test: SARS Coronavirus Critical Value: Covid +  Name of Provider Notified: Whitney Post, RN  Orders Received? Or Actions Taken?: Actions Taken: Surveyor, mining

## 2020-01-11 NOTE — ED Triage Notes (Signed)
Pt reports being dx with COVID-19 appx 8 days ago and has been having worsening cough and fever. Last temp at home was 99.9

## 2020-01-11 NOTE — ED Notes (Signed)
Patient assisted on bedside commode. Patient given call bell and will call out once done.

## 2020-01-11 NOTE — ED Notes (Signed)
Spoke to lab. Lab will run CRP and ferritin off of red top. Patient will need 2nd red top for HIV blood orders.

## 2020-01-11 NOTE — ED Notes (Signed)
Bedside commode positioned for patient. Call bell within reach.

## 2020-01-12 DIAGNOSIS — A419 Sepsis, unspecified organism: Secondary | ICD-10-CM

## 2020-01-12 DIAGNOSIS — J189 Pneumonia, unspecified organism: Secondary | ICD-10-CM | POA: Diagnosis not present

## 2020-01-12 LAB — CBC
HCT: 45.2 % (ref 36.0–46.0)
Hemoglobin: 14.2 g/dL (ref 12.0–15.0)
MCH: 31.1 pg (ref 26.0–34.0)
MCHC: 31.4 g/dL (ref 30.0–36.0)
MCV: 98.9 fL (ref 80.0–100.0)
Platelets: 175 10*3/uL (ref 150–400)
RBC: 4.57 MIL/uL (ref 3.87–5.11)
RDW: 12.4 % (ref 11.5–15.5)
WBC: 6.9 10*3/uL (ref 4.0–10.5)
nRBC: 0 % (ref 0.0–0.2)

## 2020-01-12 LAB — COMPREHENSIVE METABOLIC PANEL
ALT: 65 U/L — ABNORMAL HIGH (ref 0–44)
AST: 52 U/L — ABNORMAL HIGH (ref 15–41)
Albumin: 3.5 g/dL (ref 3.5–5.0)
Alkaline Phosphatase: 45 U/L (ref 38–126)
Anion gap: 9 (ref 5–15)
BUN: 16 mg/dL (ref 8–23)
CO2: 27 mmol/L (ref 22–32)
Calcium: 8.6 mg/dL — ABNORMAL LOW (ref 8.9–10.3)
Chloride: 104 mmol/L (ref 98–111)
Creatinine, Ser: 0.75 mg/dL (ref 0.44–1.00)
GFR calc Af Amer: >60 mL/min (ref >60)
GFR calc non Af Amer: >60 mL/min (ref >60)
Glucose, Bld: 114 mg/dL — ABNORMAL HIGH (ref 70–99)
Potassium: 3.9 mmol/L (ref 3.5–5.1)
Sodium: 140 mmol/L (ref 135–145)
Total Bilirubin: 0.6 mg/dL (ref 0.3–1.2)
Total Protein: 7.3 g/dL (ref 6.5–8.1)

## 2020-01-12 LAB — ABO/RH
ABO/RH(D): A POS
ABO/RH(D): A POS

## 2020-01-12 LAB — HIV ANTIBODY (ROUTINE TESTING W REFLEX): HIV Screen 4th Generation wRfx: NONREACTIVE

## 2020-01-12 LAB — D-DIMER, QUANTITATIVE: D-Dimer, Quant: 0.4 ug/mL-FEU (ref 0.00–0.50)

## 2020-01-12 LAB — FERRITIN: Ferritin: 1141 ng/mL — ABNORMAL HIGH (ref 11–307)

## 2020-01-12 LAB — C-REACTIVE PROTEIN: CRP: 5.2 mg/dL — ABNORMAL HIGH (ref <1.0)

## 2020-01-12 NOTE — Progress Notes (Signed)
Patient scheduled for outpatient Remdesivir infusion at 9am on Thursday 7/22 and Saturday 7/24 at [facility]. Please inform the patient to park at [address] [place], [place], as staff will be escorting the patient through the east entrance of the hospital.   There is a wave flag banner located near the entrance on [address]. Turn into this entrance and immediately turn left and park in 1 of the 5 designated Covid Infusion Parking spots. There is a phone number on the sign, please call and let the staff know what spot you are in and they will come out and get you. For questions call [phone].  Thanks

## 2020-01-12 NOTE — Discharge Instructions (Signed)
Patient scheduled for outpatient Remdesivir infusion at 9am on Thursday 7/22 and Saturday 7/24 at [facility]. Please park at [address] [place], [place], as staff will be escorting you through the east entrance of the hospital.   There is a wave flag banner located at the entrance on [address]. Turn into this entrance and immediately turn left and park in 1 of the 5 designated Covid Infusion Parking spots. There is a phone number on the sign, please call and let the staff know what spot you are in and we will come out and get you. For questions call [phone].  Thanks

## 2020-01-12 NOTE — Progress Notes (Signed)
PROGRESS NOTE  Erica Oneill  CVE:[id number] DOB: 16-Oct-1955 DOA: 01/11/2020 PCP: Hadley Pen, MD  Brief Narrative:  64 year old female HTN, obesity Chest pain admission 07/21/2018 She was found on 1/28 2020 also to have Acute pericarditis and was placed on high-dose colchicine aspirin and was told to continue colchicine for 3 months She had a Zio patch placed as well which was read to show just PACs read by Dr. Tomie China Hypertension  She was not vaccinated against coronavirus 19 presented with cough nausea vomiting diarrhea Found to be febrile 103 heart rate 101 respiratory rate 24 And found to have COVID-19   Assessment & Plan:   Principal Problem:   Sepsis due to pneumonia [facility]) Active Problems:   Essential hypertension   Long-term use of aspirin therapy   Febrile illness   Viral pneumonia   1. Coronavirus 19 pneumonia on admission with sepsis and possible superimposed bacterial pneumonia a. No current oxygen requirement b. Keep on Covid pathway remdesivir/steroids and may be able to complete treatment outpatient c. Ambulating sats are above 95 2. Sinus tachycardia a. Resolved keep on monitors because of her prior history b. Can likely discontinue a.m. 3. HTN a. Continue Bystolic 5 daily 4. Prior pericarditis 5. BMI 33  DVT prophylaxis: Lovenox Code Status: Full Family Communication: None at present at this time disposition: Inpatient  Status is: Inpatient  Remains inpatient appropriate because:IV treatments appropriate due to intensity of illness or inability to take PO   Dispo: The patient is from: Home              Anticipated d/c is to: Home              Anticipated d/c date is: 1 day              Patient currently is not medically stable to d/c.       Consultants:   None  Procedures: No  Antimicrobials: No   Subjective: Feels fine Walking around the room No chest pain  Objective: Vitals:   01/11/20 2058 01/12/20 0220 01/12/20  0603 01/12/20 1221  BP: 134/83 119/64 124/75 117/70  Pulse: 69 68 76 72  Resp: 20 18 20 18   Temp: 98.3 F (36.8 C) 98.1 F (36.7 C) 98.9 F (37.2 C) 99 F (37.2 C)  TempSrc: Oral Oral Oral Oral  SpO2: 98% 99% 94% 95%  Weight:      Height:       No intake or output data in the 24 hours ending 01/12/20 1319 Filed Weights   01/11/20 0430  Weight: 90.7 kg    Examination:  General exam: EOMI NCAT no focal deficit Respiratory system: Clear no added sound no rales no rhonchi Cardiovascular system: S1-S2 no murmur sinus rhythm on monitors Gastrointestinal system: Abdomen soft no rebound no guarding. Central nervous system: Neurologically intact no focal deficit Extremities: ROM intact Skin: No lower extremity edema Psychiatry: Euthymic pleasant coherent congruent  Data Reviewed: I have personally reviewed following labs and imaging studies COVID-19 Labs  Recent Labs    01/11/20 0532 01/12/20 0516  DDIMER 0.74* 0.40  FERRITIN 1,878* 1,141*  LDH 294*  --   CRP 1.9* 5.2*    Lab Results  Component Value Date   SARSCOV2NAA POSITIVE (A) 01/11/2020   SARSCOV2NAA NEGATIVE 09/15/2019   SARSCOV2NAA NEGATIVE 08/25/2019   BUN/creatinine down from 1.08-0.75 Ferritin 1/141 Hemoglobin 14.2 WBC 6.9  Radiology Studies: Portable chest 1 View  Result Date: 01/11/2020 CLINICAL DATA:  Initial evaluation for  worsening cough and fever. Recent history of COVID diagnosis. EXAM: PORTABLE CHEST 1 VIEW COMPARISON:  Prior radiograph from 06/30/2019. FINDINGS: Transverse heart size at the upper limits of normal. Mediastinal silhouette within normal limits. Lungs normally inflated. Scattered peribronchial thickening with interstitial opacity seen involving primarily the mid and lower lungs, slightly worse on the left. Findings most likely related to history of recently diagnosed viral pneumonitis. Superimposed streaky bibasilar atelectatic changes. No frank airspace consolidation. No visible  pleural effusion. No pneumothorax. No acute osseous finding. IMPRESSION: Scattered peribronchial thickening with interstitial opacities involving primarily the mid and lower lungs, slightly worse on the left. Findings most likely related to history of recently diagnosed viral pneumonitis. No frank airspace consolidation. Electronically Signed   By: Rise Mu M.D.   On: 01/11/2020 06:19     Scheduled Meds: . vitamin C  500 mg Oral Daily  . dexamethasone (DECADRON) injection  6 mg Intravenous Q24H  . enoxaparin (LOVENOX) injection  40 mg Subcutaneous Q24H  . nebivolol  5 mg Oral Daily  . zinc sulfate  220 mg Oral Daily   Continuous Infusions: . remdesivir 100 mg in NS 100 mL 100 mg (01/12/20 1002)     LOS: 1 day    Time spent: 20  Rhetta Mura, MD [facility] To contact the attending provider between 7A-7P or the covering provider during after hours 7P-7A, please log into the web site [email or web address] and access using universal [place] password for that web site. If you do not have the password, please call the hospital operator.  01/12/2020, 1:19 PM

## 2020-01-12 NOTE — TOC Progression Note (Signed)
Transition of Care [facility]) - Progression Note    Patient Details  Name: Erica Oneill MRN: [id number] Date of Birth: 1956/05/16  [facility] CM/SW Contact  Geni Bers, RN Phone Number: 01/12/2020, 3:12 PM  Clinical Narrative:    Pt is from home with parents. TOC will follow for discharge needs.    Expected Discharge Plan: Home/Self Care Barriers to Discharge: No Barriers Identified  Expected Discharge Plan and Services Expected Discharge Plan: Home/Self Care       Living arrangements for the past 2 months: Single Family Home                                       Social Determinants of Health (SDOH) Interventions    Readmission Risk Interventions No flowsheet data found.

## 2020-01-13 DIAGNOSIS — A419 Sepsis, unspecified organism: Secondary | ICD-10-CM | POA: Diagnosis not present

## 2020-01-13 DIAGNOSIS — J189 Pneumonia, unspecified organism: Secondary | ICD-10-CM | POA: Diagnosis not present

## 2020-01-13 LAB — FERRITIN: Ferritin: 999 ng/mL — ABNORMAL HIGH (ref 11–307)

## 2020-01-13 LAB — D-DIMER, QUANTITATIVE: D-Dimer, Quant: 0.45 ug/mL-FEU (ref 0.00–0.50)

## 2020-01-13 LAB — C-REACTIVE PROTEIN: CRP: 2.8 mg/dL — ABNORMAL HIGH (ref <1.0)

## 2020-01-13 MED ORDER — DEXAMETHASONE 6 MG PO TABS: 6.0000 mg | ORAL_TABLET | Freq: Every day | ORAL | 0 refills | Status: DC

## 2020-01-13 MED ORDER — DEXAMETHASONE 6 MG PO TABS: 6.0000 mg | ORAL_TABLET | Freq: Every day | ORAL | 0 refills | Status: AC

## 2020-01-13 NOTE — Plan of Care (Signed)
Discharge instructions reviewed with patient, questions answered, verbalized understanding.  Patient awaiting ride.   

## 2020-01-13 NOTE — Discharge Summary (Signed)
Physician Discharge Summary  Erica Oneill KGU:[id number] DOB: Jul 12, 1955 DOA: 01/11/2020  PCP: Hadley Pen, MD  Admit date: 01/11/2020 Discharge date: 01/13/2020  Time spent: 35 minutes  Recommendations for Outpatient Follow-up:  1. Needs outpatient remdesivir infusions, Decadron and coronavirus 19 monitoring 2. Recommend continuation of outpatient aspirin 81 mg 3. Consider chest x-ray 1 month  Discharge Diagnoses:  Principal Problem:   Sepsis due to pneumonia [facility]) Active Problems:   Essential hypertension   Long-term use of aspirin therapy   Febrile illness   Viral pneumonia   Discharge Condition: Improved  Diet recommendation: Heart healthy  Filed Weights   01/11/20 0430  Weight: 90.7 kg    History of present illness:  64 year old female HTN, obesity Chest pain admission 07/21/2018 She was found on 1/28 2020 also to have Acute pericarditis and was placed on high-dose colchicine aspirin and was told to continue colchicine for 3 months She had a Zio patch placed as well which was read to show just PACs read by Dr. Tomie China Hypertension  She was not vaccinated against coronavirus 19 presented with cough nausea vomiting diarrhea Found to be febrile 103 heart rate 101 respiratory rate 24 And found to have COVID-19   Hospital Course:  1. Coronavirus 19 pneumonia on admission with sepsis and possible superimposed bacterial pneumonia a. No current oxygen requirement and did well during hospital stay b. All inflammatory markers have diminished and are not overtly concerning c. Keep on Covid pathway remdesivir/steroids  d. Will receive 5-day doses of Decadron called into pharmacy e. Set up already for remdesivir clinic 2. Sinus tachycardia prior history with ZIO patch in the past a. Stabilized b. No further work-up 3. HTN a. Continue Bystolic 5 daily 4. Prior pericarditis 5. BMI 33 1. History of knee surgery in the past which prevents her from doing some  activity and hopefully she can recover  Discharge Exam: Vitals:   01/12/20 2205 01/13/20 0530  BP: 127/65 106/66  Pulse: 71 74  Resp: 17 18  Temp: 98.3 F (36.8 C) 97.7 F (36.5 C)  SpO2: 95% 96%    General: Awake alert coherent no distress Cardiovascular: S1-S2 no murmur rub or gallop clinically  Respiratory: Rales no rhonchi clear No lower extremity edema Neurologically intact  Discharge Instructions   Discharge Instructions    Diet - low sodium heart healthy   Complete by: As directed    Discharge instructions   Complete by: As directed    Make sure that you complete the steroids Make sure that you keep yourself isolated from others for total of 21 days from the time of your diagnosis as you were considered even though he did not have symptoms to have more clinically severe disease Would recommend continuation of all of the ancillary products that you have used Would recommend outpatient remdesivir and they should call you from the clinic-my understanding is you are to show up at the clinic as per instructions given Consider outpatient chest x-ray in about 4 weeks if you have continued cough but not necessary Follow-up with your PCP in 1 month   Increase activity slowly   Complete by: As directed    Increase activity slowly   Complete by: As directed    Temperature monitoring   Complete by: Jan 13, 2020    After how many days would you like to receive a notification of this patient's flowsheet entries?: 1   MyChart COVID-19 home monitoring program   Complete by: Jan 14, 2020  Is the patient willing to use the MyChart Mobile App for home monitoring?: Yes     Allergies as of 01/13/2020      Reactions   Lidocaine Anaphylaxis   Rocephin [ceftriaxone] Anaphylaxis   Fentanyl    Hypotension   Ibuprofen    tachycardia   Meloxicam    tachycardia   Morphine And Related    Hypotension   Sulfamethoxazole-trimethoprim Diarrhea, Nausea And Vomiting       Other Rash,  Hypertension   seafood      Medication List    STOP taking these medications   predniSONE 10 MG (21) Tbpk tablet Commonly known as: STERAPRED UNI-PAK 21 TAB     TAKE these medications   aspirin EC 81 MG tablet Take 81 mg by mouth daily. Swallow whole.   dexamethasone 6 MG tablet Commonly known as: DECADRON Take 1 tablet (6 mg total) by mouth daily for 6 days.   nebivolol 5 MG tablet Commonly known as: BYSTOLIC Take 5 mg by mouth daily.   VITAMIN C PO Take 1 capsule by mouth daily.   VITAMIN D3 PO Take 1 capsule by mouth daily.   ZINC PO Take 1 tablet by mouth daily.      Allergies  Allergen Reactions  . Lidocaine Anaphylaxis  . Rocephin [Ceftriaxone] Anaphylaxis  . Fentanyl     Hypotension   . Ibuprofen     tachycardia  . Meloxicam     tachycardia  . Morphine And Related     Hypotension   . Sulfamethoxazole-Trimethoprim Diarrhea and Nausea And Vomiting       . Other Rash and Hypertension    seafood      The results of significant diagnostics from this hospitalization (including imaging, microbiology, ancillary and laboratory) are listed below for reference.    Significant Diagnostic Studies: Portable chest 1 View  Result Date: 01/11/2020 CLINICAL DATA:  Initial evaluation for worsening cough and fever. Recent history of COVID diagnosis. EXAM: PORTABLE CHEST 1 VIEW COMPARISON:  Prior radiograph from 06/30/2019. FINDINGS: Transverse heart size at the upper limits of normal. Mediastinal silhouette within normal limits. Lungs normally inflated. Scattered peribronchial thickening with interstitial opacity seen involving primarily the mid and lower lungs, slightly worse on the left. Findings most likely related to history of recently diagnosed viral pneumonitis. Superimposed streaky bibasilar atelectatic changes. No frank airspace consolidation. No visible pleural effusion. No pneumothorax. No acute osseous finding. IMPRESSION: Scattered peribronchial thickening  with interstitial opacities involving primarily the mid and lower lungs, slightly worse on the left. Findings most likely related to history of recently diagnosed viral pneumonitis. No frank airspace consolidation. Electronically Signed   By: Rise Mu M.D.   On: 01/11/2020 06:19    Microbiology: Recent Results (from the past 240 hour(s))  SARS Coronavirus 2 by RT PCR (hospital order, performed in [facility] hospital lab) Nasopharyngeal Nasopharyngeal Swab     Status: Abnormal   Collection Time: 01/11/20  6:48 AM   Specimen: Nasopharyngeal Swab  Result Value Ref Range Status   SARS Coronavirus 2 POSITIVE (A) NEGATIVE Final    Comment: RESULT CALLED TO, READ BACK BY AND VERIFIED WITH: CLAPP,S RN @0939  ON 01/11/20 JACKSON,K Performed at [facility], [address], [place], [place]      Labs: Basic Metabolic Panel: Recent Labs  Lab 01/11/20 0532 01/11/20 0838 01/12/20 0516  NA 139  --  140  K 3.7  --  3.9  CL 102  --  104  CO2 24  --  27  GLUCOSE 108*  --  114*  BUN 14  --  16  CREATININE 0.83 1.08* 0.75  CALCIUM 8.7*  --  8.6*   Liver Function Tests: Recent Labs  Lab 01/11/20 0532 01/12/20 0516  AST 79* 52*  ALT 83* 65*  ALKPHOS 49 45  BILITOT 0.4 0.6  PROT 7.5 7.3  ALBUMIN 3.8 3.5   No results for input(s): LIPASE, AMYLASE in the last 168 hours. No results for input(s): AMMONIA in the last 168 hours. CBC: Recent Labs  Lab 01/11/20 0532 01/11/20 0838 01/12/20 0516  WBC 7.7 8.0 6.9  NEUTROABS 5.2  --   --   HGB 14.7 14.1 14.2  HCT 46.2* 44.1 45.2  MCV 97.3 98.0 98.9  PLT 168 165 175   Cardiac Enzymes: No results for input(s): CKTOTAL, CKMB, CKMBINDEX, TROPONINI in the last 168 hours. BNP: BNP (last 3 results) Recent Labs    01/11/20 0532  BNP 25.8    ProBNP (last 3 results) No results for input(s): PROBNP in the last 8760 hours.  CBG: No results for input(s): GLUCAP in the last 168  hours.     Signed:  Rhetta Mura MD   [facility] 01/13/2020, 10:45 AM

## 2020-01-15 ENCOUNTER — Ambulatory Visit (HOSPITAL_COMMUNITY)
Admit: 2020-01-15 | Discharge: 2020-01-15 | Disposition: A | Discharge status: Home / Self Care | Payer: Medicare Other | Attending: Pulmonary Disease | Admitting: Pulmonary Disease

## 2020-01-15 DIAGNOSIS — U071 COVID-19: Secondary | ICD-10-CM | POA: Diagnosis present

## 2020-01-15 DIAGNOSIS — J1282 Pneumonia due to coronavirus disease 2019: Secondary | ICD-10-CM | POA: Diagnosis not present

## 2020-01-15 MED ORDER — SODIUM CHLORIDE 0.9 % IV SOLN
100.0000 mg | Freq: Once | INTRAVENOUS | Status: AC
  Administered 2020-01-15: 100 mg via INTRAVENOUS
  Filled 2020-01-15: qty 20

## 2020-01-15 MED ORDER — ALBUTEROL SULFATE HFA 108 (90 BASE) MCG/ACT IN AERS: 2.0000 | INHALATION_SPRAY | Freq: Once | RESPIRATORY_TRACT | Status: DC | PRN

## 2020-01-15 MED ORDER — SODIUM CHLORIDE 0.9 % IV SOLN: INTRAVENOUS | Status: DC | PRN

## 2020-01-15 MED ORDER — EPINEPHRINE 0.3 MG/0.3ML IJ SOAJ: 0.3000 mg | Freq: Once | INTRAMUSCULAR | Status: DC | PRN

## 2020-01-15 MED ORDER — DIPHENHYDRAMINE HCL 50 MG/ML IJ SOLN: 50.0000 mg | Freq: Once | INTRAMUSCULAR | Status: DC | PRN

## 2020-01-15 MED ORDER — METHYLPREDNISOLONE SODIUM SUCC 125 MG IJ SOLR: 125.0000 mg | Freq: Once | INTRAMUSCULAR | Status: DC | PRN

## 2020-01-15 MED ORDER — FAMOTIDINE IN NACL 20-0.9 MG/50ML-% IV SOLN: 20.0000 mg | Freq: Once | INTRAVENOUS | Status: DC | PRN

## 2020-01-15 NOTE — Progress Notes (Signed)
°  Diagnosis: COVID-19 ° °Physician:Dr Wright ° °Procedure: Covid Infusion Clinic Med: casirivimab\imdevimab infusion - Provided patient with casirivimab\imdevimab fact sheet for patients, parents and caregivers prior to infusion. ° °Complications: No immediate complications noted. ° °Discharge: Discharged home  ° °Erica Oneill °01/15/2020 ° °

## 2020-01-15 NOTE — Discharge Instructions (Signed)
10 Things You Can Do to Manage Your COVID-19 Symptoms at Home If you have possible or confirmed COVID-19: 1. Stay home from work and school. And stay away from other public places. If you must go out, avoid using any kind of public transportation, ridesharing, or taxis. 2. Monitor your symptoms carefully. If your symptoms get worse, call your healthcare provider immediately. 3. Get rest and stay hydrated. 4. If you have a medical appointment, call the healthcare provider ahead of time and tell them that you have or may have COVID-19. 5. For medical emergencies, call 911 and notify the dispatch personnel that you have or may have COVID-19. 6. Cover your cough and sneezes with a tissue or use the inside of your elbow. 7. Wash your hands often with soap and water for at least 20 seconds or clean your hands with an alcohol-based hand sanitizer that contains at least 60% alcohol. 8. As much as possible, stay in a specific room and away from other people in your home. Also, you should use a separate bathroom, if available. If you need to be around other people in or outside of the home, wear a mask. 9. Avoid sharing personal items with other people in your household, like dishes, towels, and bedding. 10. Clean all surfaces that are touched often, like counters, tabletops, and doorknobs. Use household cleaning sprays or wipes according to the label instructions. cdc.gov/coronavirus 12/25/2018 This information is not intended to replace advice given to you by your health care provider. Make sure you discuss any questions you have with your health care provider. Document Revised: 05/29/2019 Document Reviewed: 05/29/2019 Elsevier Patient Education  2020 Elsevier Inc.  

## 2020-01-17 ENCOUNTER — Ambulatory Visit (HOSPITAL_COMMUNITY): Payer: Medicare Other

## 2020-02-06 ENCOUNTER — Telehealth: Payer: Self-pay | Admitting: Cardiology

## 2020-02-06 NOTE — Telephone Encounter (Signed)
She needs to go to urgent care center or her primary care physician for this.

## 2020-02-06 NOTE — Telephone Encounter (Signed)
Called patient. She reports that she had covid, tested positive on 01/08/20. Since then her heart rate has been fluctuating at times with palpitations. She also reports a "soreness: to her chest. It is on the right side and felt in her back at times. She says it isn't a pain just a soreness that comes and goes. She says it is similar to the pericarditis pain that she has had before. She is short of breath at times but she feels it is from covid. She wants to be evaluated but does not want to go to the emergency room. Will consult with Dr. Tomie China.

## 2020-02-06 NOTE — Telephone Encounter (Signed)
Pt c/o of Chest Pain: STAT if CP now or developed within 24 hours  1. Are you having CP right now? yes  2. Are you experiencing any other symptoms (ex. SOB, nausea, vomiting, sweating)? SOB sometimes, not all the time  3. How long have you been experiencing CP? Since she had covid, about 30 days  4. Is your CP continuous or coming and going? Comes and goes  5. Have you taken Nitroglycerin? No  Patient stating she has been having a soreness in her chest and mid back for 30 days. She states she sometimes gets SOB and her HR has been irratic. She states her HR go from 60's, 70, 90's and 100's. She states now it is 72. ?

## 2020-02-06 NOTE — Telephone Encounter (Signed)
Called patient informed her of Dr. Kem Parkinson recommendation. She understood and has appointment with pcp next week. She will go to urgent care if anything gets worse in the mean time.

## 2020-02-10 ENCOUNTER — Other Ambulatory Visit: Payer: Self-pay

## 2020-02-10 ENCOUNTER — Telehealth: Payer: Self-pay | Admitting: Cardiology

## 2020-02-10 NOTE — Telephone Encounter (Signed)
[facility] aware that Dr. Tomie China is ok with pt being seen by Dr. Okey Dupre tomorrow as he is out of the office this week.

## 2020-02-10 NOTE — Telephone Encounter (Signed)
Patient calling requesting to speak with [place].

## 2020-02-11 ENCOUNTER — Other Ambulatory Visit: Payer: Self-pay

## 2020-02-11 ENCOUNTER — Encounter: Payer: Self-pay | Admitting: Internal Medicine

## 2020-02-11 ENCOUNTER — Ambulatory Visit: Payer: BLUE CROSS/BLUE SHIELD | Admitting: Internal Medicine

## 2020-02-11 ENCOUNTER — Ambulatory Visit (INDEPENDENT_AMBULATORY_CARE_PROVIDER_SITE_OTHER): Payer: Medicare Other | Admitting: Internal Medicine

## 2020-02-11 VITALS — BP 122/70 | HR 97 | Ht 64.0 in | Wt 197.0 lb

## 2020-02-11 DIAGNOSIS — I517 Cardiomegaly: Secondary | ICD-10-CM

## 2020-02-11 DIAGNOSIS — Z8679 Personal history of other diseases of the circulatory system: Secondary | ICD-10-CM

## 2020-02-11 DIAGNOSIS — R079 Chest pain, unspecified: Secondary | ICD-10-CM | POA: Diagnosis not present

## 2020-02-11 DIAGNOSIS — R0789 Other chest pain: Secondary | ICD-10-CM | POA: Insufficient documentation

## 2020-02-11 NOTE — Patient Instructions (Signed)
Medication Instructions:  Your physician recommends that you continue on your current medications as directed. Please refer to the Current Medication list given to you today.  *If you need a refill on your cardiac medications before your next appointment, please call your pharmacy*  Lab Work: none If you have labs (blood work) drawn today and your tests are completely normal, you will receive your results only by: Marland Kitchen MyChart Message (if you have MyChart) OR . A paper copy in the mail If you have any lab test that is abnormal or we need to change your treatment, we will call you to review the results.  Testing/Procedures: Your physician has requested that you have an echocardiogram today or tomorrow. Echocardiography is a painless test that uses sound waves to create images of your heart. It provides your doctor with information about the size and shape of your heart and how well your heart's chambers and valves are working. This procedure takes approximately one hour. There are no restrictions for this procedure. You may get an IV, if needed, to receive an ultrasound enhancing agent through to better visualize your heart.   Follow-Up: At [facility], you and your health needs are our priority.  As part of our continuing mission to provide you with exceptional heart care, we have created designated Provider Care Teams.  These Care Teams include your primary Cardiologist (physician) and Advanced Practice Providers (APPs -  Physician Assistants and Nurse Practitioners) who all work together to provide you with the care you need, when you need it.  We recommend signing up for the patient portal called "MyChart".  Sign up information is provided on this After Visit Summary.  MyChart is used to connect with patients for Virtual Visits (Telemedicine).  Patients are able to view lab/test results, encounter notes, upcoming appointments, etc.  Non-urgent messages can be sent to your provider as well.    To learn more about what you can do with MyChart, go to [email or web address].    Your next appointment:   2 week(s) with an APP.  The format for your next appointment:   In Person  Provider:    You may see one of the following Advanced Practice Providers on your designated Care Team:    Nicolasa Ducking, NP  Eula Listen, PA-C  Marisue Ivan, PA-C  Gillian Shields, NP

## 2020-02-11 NOTE — Progress Notes (Signed)
Follow-up Outpatient Visit Date: 02/11/2020  Primary Care Provider: Hadley Pen, MD [address] [place] [address]   Primary Cardiologist: Belva Crome, MD  Chief Complaint: Abnormal chest x-ray  HPI:  Erica Oneill is a 64 y.o. female with history of pericarditis, PSVT, hypertension, syncope, and recent hospitalization for COVID-19 infection (12/2019), who is seen for urgent evaluation of incidentally identified cardiomegaly.  The patient has followed in the past with Dr. Tomie China.  She reached out to Dr. Kem Parkinson office last week noting palpitations since her COVID-19 diagnosis on 01/08/2020. and "soreness" in her chest.  Patient was advised to see her PCP or urgent care, where she had a chest radiograph done that showed cardiomegaly and pulmonary vascular congestion.  Erica Oneill notes that for the last few days she has experienced soreness in her chest.  It is tender along the sternum, with pain radiating to the left lateral chest wall into her upper back.  Symptoms began when she received her fourth dose of remdesivir last month.  It is not positional nor exertional.  However, it is reminiscent of what she experienced with pericarditis last year.  Today, she actually feels a little bit better than she has the last few days but still has vague uneasy feeling in her chest.  She has noticed some improvement inthe pain with acetaminophen.  Erica Oneill denies any shortness of breath.  She has been monitoring her oxygen saturations at home and notes that they are typically between 96-98%.  Her heart rate has been somewhat elevated today.  She also has experienced intermittent palp patient is recently and is concerned that she may be having atrial fibrillation.  She denies orthopnea, PND, and edema.  --------------------------------------------------------------------------------------------------  Past Medical History:  Diagnosis Date  . Acute cystitis with hematuria  12/13/2018  . Arthralgia of multiple sites 08/28/2018  . COVID-19 12/2019  . Essential hypertension 09/03/2018  . Febrile illness 08/28/2018  . Hematuria 08/28/2018  . Hypertension   . Long-term use of aspirin therapy 08/06/2018  . Palpitations 09/03/2018  . Pericarditis 07/20/2018  . PSVT (paroxysmal supraventricular tachycardia) (HCC) 09/03/2018  . Rectal bleeding 08/28/2018  . Situational anxiety 08/28/2018  . Syncope 07/20/2018   Past Surgical History:  Procedure Laterality Date  . ABDOMINAL HYSTERECTOMY    . CATARACT EXTRACTION W/PHACO Left 08/27/2019   Procedure: CATARACT EXTRACTION PHACO AND INTRAOCULAR LENS PLACEMENT (IOC) LEFT;  Surgeon: Lockie Mola, MD;  Location: [facility];  Service: Ophthalmology;  Laterality: Left;  6.08 0:53.6 11.4%  . CATARACT EXTRACTION W/PHACO Right 09/17/2019   Procedure: CATARACT EXTRACTION PHACO AND INTRAOCULAR LENS PLACEMENT (IOC) RIGHT;  Surgeon: Lockie Mola, MD;  Location: [facility];  Service: Ophthalmology;  Laterality: Right;  4.83 0:42.7 11.3%  . TORN MENISCUS IN LEFT KNEE Left     Current Meds  Medication Sig  . Ascorbic Acid (VITAMIN C PO) Take 1 capsule by mouth daily.   Marland Kitchen aspirin EC 81 MG tablet Take 81 mg by mouth daily. Swallow whole.  . Multiple Vitamins-Minerals (ZINC PO) Take 1 tablet by mouth daily.  . nebivolol (BYSTOLIC) 5 MG tablet Take 5 mg by mouth daily.     Allergies: Lidocaine, Rocephin [ceftriaxone], Fentanyl, Ibuprofen, Meloxicam, Morphine and related, Sulfamethoxazole-trimethoprim, and Other  Social History   Tobacco Use  . Smoking status: Never Smoker  . Smokeless tobacco: Never Used  Vaping Use  . Vaping Use: Never used  Substance Use Topics  . Alcohol use: Not Currently  .  Drug use: Never    Family History  Problem Relation Age of Onset  . Hypertension Mother   . Heart attack Mother 77  . Cancer Father   . Heart attack Maternal Grandfather 49    Review of Systems: A  12-system review of systems was performed and was negative except as noted in the HPI.  --------------------------------------------------------------------------------------------------  Physical Exam: BP 122/70 (BP Location: Left Arm, Patient Position: Sitting, Cuff Size: Large)   Pulse 97   Ht 5\' 4"  (1.626 m)   Wt 197 lb (89.4 kg)   BMI 33.81 kg/m   General: NAD. Neck: No JVD or HJR. Lungs: Normal work of breathing. Clear to auscultation bilaterally without wheezes or crackles. Heart: Regular rate and rhythm without murmurs, rubs, or gallops.  Scratch that Abdomen: Soft, nontender, nondistended. Extremities: No lower.  EKG: Normal sinus rhythm with borderline LVH and nonspecific ST changes.  No significant change from prior tracing on 03/28/2019.  Lab Results  Component Value Date   WBC 6.9 01/12/2020   HGB 14.2 01/12/2020   HCT 45.2 01/12/2020   MCV 98.9 01/12/2020   PLT 175 01/12/2020    Lab Results  Component Value Date   NA 140 01/12/2020   K 3.9 01/12/2020   CL 104 01/12/2020   CO2 27 01/12/2020   BUN 16 01/12/2020   CREATININE 0.75 01/12/2020   GLUCOSE 114 (H) 01/12/2020   ALT 65 (H) 01/12/2020    No results found for: CHOL, HDL, LDLCALC, LDLDIRECT, TRIG, CHOLHDL  --------------------------------------------------------------------------------------------------  ASSESSMENT AND PLAN: Chest pain and cardiomegaly: Chest pain is atypical but could reflect pericarditis given the patient's history.  CRP obtained yesterday by her PCP was mildly elevated.  Chest radiograph performed yesterday also makes note of cardiomegaly.  Unfortunately, images are not available for review.  EKG today does not show any findings consistent with acute pericarditis.  Functional level is also not evident on exam today.  I have recommended that we obtain an echocardiogram soon as possible to further assess for cardiomegaly as well as pericardial effusion or other signs of acute  pericarditis.  Think is reasonable to defer any medication changes, given that Erica Oneill is minimally symptomatic today.  Follow-up call return to clinic in 2 weeks.  Of note, the patient wishes to transition her care to our office.  01/14/2020, MD 02/11/2020 2:01 PM

## 2020-02-12 ENCOUNTER — Ambulatory Visit (INDEPENDENT_AMBULATORY_CARE_PROVIDER_SITE_OTHER): Payer: Medicare Other

## 2020-02-12 DIAGNOSIS — Z8679 Personal history of other diseases of the circulatory system: Secondary | ICD-10-CM

## 2020-02-12 DIAGNOSIS — R079 Chest pain, unspecified: Secondary | ICD-10-CM

## 2020-02-12 DIAGNOSIS — I517 Cardiomegaly: Secondary | ICD-10-CM

## 2020-02-12 LAB — ECHOCARDIOGRAM COMPLETE
AR max vel: 2.32 cm2
AV Area VTI: 2.35 cm2
AV Area mean vel: 2.13 cm2
AV Mean grad: 3 mmHg
AV Peak grad: 6.3 mmHg
Ao pk vel: 1.25 m/s
Area-P 1/2: 2.66 cm2
S' Lateral: 2.9 cm

## 2020-02-12 MED ORDER — PERFLUTREN LIPID MICROSPHERE
1.0000 mL | INTRAVENOUS | Status: AC | PRN
  Administered 2020-02-12: 2 mL via INTRAVENOUS

## 2020-02-16 ENCOUNTER — Ambulatory Visit: Payer: BLUE CROSS/BLUE SHIELD | Admitting: Cardiology

## 2020-02-25 ENCOUNTER — Other Ambulatory Visit: Payer: Self-pay

## 2020-02-25 ENCOUNTER — Ambulatory Visit (INDEPENDENT_AMBULATORY_CARE_PROVIDER_SITE_OTHER): Payer: Medicare Other | Admitting: Family

## 2020-02-25 ENCOUNTER — Encounter: Payer: Self-pay | Admitting: Family

## 2020-02-25 VITALS — BP 120/80 | HR 83 | Ht 64.0 in | Wt 204.2 lb

## 2020-02-25 DIAGNOSIS — R002 Palpitations: Secondary | ICD-10-CM

## 2020-02-25 DIAGNOSIS — I10 Essential (primary) hypertension: Secondary | ICD-10-CM

## 2020-02-25 DIAGNOSIS — I471 Supraventricular tachycardia: Secondary | ICD-10-CM

## 2020-02-25 NOTE — Progress Notes (Signed)
Office Visit    Patient Name: Erica Oneill Date of Encounter: 02/25/2020  Primary Care Provider:  Hadley Pen, MD Primary Cardiologist:  Yvonne Kendall, MD Electrophysiologist:  None   Chief Complaint    Erica Oneill is a 64 y.o. female with a hx of pericarditis, PSVT, HTN, syncope, COVID-19 infection requiring hospitalization 12/2019 presents today for follow-up after echocardiogram  Past Medical History    Past Medical History:  Diagnosis Date  . Acute cystitis with hematuria 12/13/2018  . Arthralgia of multiple sites 08/28/2018  . COVID-19 12/2019  . Essential hypertension 09/03/2018  . Febrile illness 08/28/2018  . Hematuria 08/28/2018  . Hypertension   . Long-term use of aspirin therapy 08/06/2018  . Palpitations 09/03/2018  . Pericarditis 07/20/2018  . PSVT (paroxysmal supraventricular tachycardia) (HCC) 09/03/2018  . Rectal bleeding 08/28/2018  . Situational anxiety 08/28/2018  . Syncope 07/20/2018   Past Surgical History:  Procedure Laterality Date  . ABDOMINAL HYSTERECTOMY    . CATARACT EXTRACTION W/PHACO Left 08/27/2019   Procedure: CATARACT EXTRACTION PHACO AND INTRAOCULAR LENS PLACEMENT (IOC) LEFT;  Surgeon: Lockie Mola, MD;  Location: [facility];  Service: Ophthalmology;  Laterality: Left;  6.08 0:53.6 11.4%  . CATARACT EXTRACTION W/PHACO Right 09/17/2019   Procedure: CATARACT EXTRACTION PHACO AND INTRAOCULAR LENS PLACEMENT (IOC) RIGHT;  Surgeon: Lockie Mola, MD;  Location: [facility];  Service: Ophthalmology;  Laterality: Right;  4.83 0:42.7 11.3%  . TORN MENISCUS IN LEFT KNEE Left     Allergies  Allergies  Allergen Reactions  . Lidocaine Anaphylaxis  . Rocephin [Ceftriaxone] Anaphylaxis  . Fentanyl     Hypotension   . Ibuprofen     tachycardia  . Meloxicam     tachycardia  . Morphine And Related     Hypotension   . Sulfamethoxazole-Trimethoprim Diarrhea and Nausea And Vomiting       . Other Rash and  Hypertension    seafood    History of Present Illness    Erica Oneill is a 64 y.o. female with a hx of pericarditis, PSVT, HTN, syncope, COVID-19 infection requiring hospitalization 12/2019 last seen 02/11/2020 by Dr. Okey Dupre.  Previously treated January 2020 for acute pericarditis.  ZIO monitor October 2020 with predominantly sinus rhythm.  She had 39-second atrial run with maximum HR of 169 bpm. She had occasional PVCs.  She was recommended to continue her dose of beta-blocker.  She was hospitalized with COVID-19 01/11/2020-01/13/2020 and treated remdesivir, Decadron.  Previously followed with Dr. Tomie China but has elected to switch her care to the [facility] office.  When seen 02/11/2020 she had had chest x-ray showing cardiomegaly and pulmonary vascular congestion though images unable for my review.  In clinic she noted soreness in her chest which was tender along the sternum.  It was somewhat reminiscent of her previous pericarditis pain.  She was starting to feel somewhat better.  Subsequent echocardiogram 02/12/2020 with LVEF 60 to 65%, no regional wall motion normalities, grade 1 diastolic dysfunction, RV normal size and function, no significant valvular abnormalities.  Reports marked improvement in her symptoms since last seen. We reviewed her echocardiogram together and she was reassured by the result. Reports her palpitations are well controlled. HR at home 80s-90s. Her chest discomfort has resolved. Her breathing has improved.  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 Her husband is a Optician, dispensing. 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 This keeps her  very active.   EKGs/Labs/Other Studies Reviewed:   The following studies were reviewed today:  Echo 02/12/2020  1. Left ventricular ejection fraction, by estimation, is 60 to 65%. The  left ventricle has normal function. The left ventricle has no regional  wall motion abnormalities. Left  ventricular diastolic parameters are  consistent with Grade I diastolic  dysfunction (impaired relaxation).   2. Right ventricular systolic function is normal. The right ventricular  size is normal. There is normal pulmonary artery systolic pressure.   EKG:  EKG is ordered today.  The ekg ordered today demonstrates NSR 83 bpm with no acute ST/T wave changes.   Recent Labs: 03/28/2019: TSH 3.160 01/11/2020: B Natriuretic Peptide 25.8 01/12/2020: ALT 65; BUN 16; Creatinine, Ser 0.75; Hemoglobin 14.2; Platelets 175; Potassium 3.9; Sodium 140  Recent Lipid Panel No results found for: CHOL, TRIG, HDL, CHOLHDL, VLDL, LDLCALC, LDLDIRECT  Home Medications   Current Meds  Medication Sig  . nebivolol (BYSTOLIC) 5 MG tablet Take 5 mg by mouth daily.     Review of Systems    Review of Systems  Constitutional: Negative for chills, fever and malaise/fatigue.  Cardiovascular: Negative for chest pain, dyspnea on exertion, leg swelling, near-syncope, orthopnea, palpitations and syncope.  Respiratory: Negative for cough, shortness of breath and wheezing.   Gastrointestinal: Negative for nausea and vomiting.  Neurological: Negative for dizziness, light-headedness and weakness.   All other systems reviewed and are otherwise negative except as noted above.  Physical Exam    VS:  BP 120/80 (BP Location: Left Arm, Patient Position: Sitting, Cuff Size: Large)   Pulse 83   Ht 5\' 4"  (1.626 m)   Wt 204 lb 3.2 oz (92.6 kg)   SpO2 97%   BMI 35.05 kg/m  , BMI Body mass index is 35.05 kg/m. GEN: Well nourished, well developed, in no acute distress. HEENT: normal. Neck: Supple, no JVD, carotid bruits, or masses. Cardiac: RRR, no murmurs, rubs, or gallops. No clubbing, cyanosis, edema.  Radials/DP/PT 2+ and equal bilaterally.  Respiratory:  Respirations regular and unlabored, clear to auscultation bilaterally. GI: Soft, nontender, nondistended, BS + x 4. MS: No deformity or atrophy. Skin: Warm and dry,  no rash. Neuro:  Strength and sensation are intact. Psych: Normal affect.   Assessment & Plan    1. Chest pain - Resolved since last seen. Echo 02/12/20 LVEF 60-65%, no RWMA, gr1DD, no significant valvular abnormalities. EKG without acute St/T wave changes. No indication for further workup at this time.  2. Hx of pericarditis - No evidence of recurrence. Chest pain has resolved. Echo without evidence of pericarditis.  3. Palpitations - Well controlled. Continue Bystolic 5mg  daily.  4. PSVT - No recurrence. Continue Bystolic 5mg  daily.  5. HTN - BP well controlled. Continue current antihypertensive regimen.   Disposition: Follow up in 6 month(s) with Dr. 02/14/20 or APP  , NP 02/25/2020, 2:32 PM

## 2020-02-25 NOTE — Patient Instructions (Signed)
Medication Instructions:  No medication changes today.   *If you need a refill on your cardiac medications before your next appointment, please call your pharmacy*  Lab Work: No lab work today.  Testing/Procedures: Your EKG today shows normal sinus rhythm 83 bpm.  Your echocardiogram shows normal heart pumping function and normal heart valves. It shows your heart is mildly stiff, this is a very common finding. We help protect the heart from getting more stiff by keeping your blood pressure well controlled.  Follow-Up: At [facility], you and your health needs are our priority.  As part of our continuing mission to provide you with exceptional heart care, we have created designated Provider Care Teams.  These Care Teams include your primary Cardiologist (physician) and Advanced Practice Providers (APPs -  Physician Assistants and Nurse Practitioners) who all work together to provide you with the care you need, when you need it.  We recommend signing up for the patient portal called "MyChart".  Sign up information is provided on this After Visit Summary.  MyChart is used to connect with patients for Virtual Visits (Telemedicine).  Patients are able to view lab/test results, encounter notes, upcoming appointments, etc.  Non-urgent messages can be sent to your provider as well.   To learn more about what you can do with MyChart, go to [email or web address].    Your next appointment:   6 month(s)  The format for your next appointment:   In Person  Provider:   You may see Yvonne Kendall, MD or one of the following Advanced Practice Providers on your designated Care Team:   Nicolasa Ducking, NP  Eula Listen, PA-C  Gillian Shields, NP  Marisue Ivan, PA-C

## 2020-03-09 ENCOUNTER — Ambulatory Visit: Payer: BLUE CROSS/BLUE SHIELD | Admitting: Cardiology

## 2020-08-23 NOTE — Progress Notes (Signed)
Follow-up Outpatient Visit Date: 08/25/2020  Primary Care Provider: Hadley Pen, MD [address] [place] [address]  Chief Complaint: Chest pain  HPI:  [name] is a 65 y.o. female with history of pericarditis, PSVT, hypertension, syncope, and hospitalization for COVID-19 infection (12/2019), who presents for follow-up of incidentally noted cardiomegaly by chest radiograph.  Subsequent echo showed normal LV size and wall thickness with normal LVEF.  There was no pericardial effusion.  She was seen for follow-up by Gillian Shields, NP in early Oneill, at which time she reported resolution of prior chest pain as well as significant improvement in shortness of breath.  No further testing or intervention was recommended.  Today, Erica Oneill reports that she has experienced a few episodes of left-sided chest pain as well as indigestion.  Symptoms most often, occur at night, especially when she is lying with her left side down.  She has not had any exertional chest pain.  Discomfort is not brought on by deep inspiration nor is it reminiscent of what she experienced with pericarditis.  Symptoms improved with as needed Pepcid AC.  She has occasional palpitations when tired without associated symptoms.  She denies shortness of breath.  She has felt like her hands and ankles are a bit swollen.  She remains on a low-dose nebivolol, which she is tolerating without side effects.  She has put on almost 20 pounds since 01/2020, which she attributes to dietary indiscretion and inactivity.  She notes that she had lost quite a bit of weight last summer in the setting of COVID-19 infection.  --------------------------------------------------------------------------------------------------  Past Medical History:  Diagnosis Date  . Acute cystitis with hematuria 12/13/2018  . Arthralgia of multiple sites 08/28/2018  . COVID-19 12/2019  . Essential hypertension 09/03/2018  . Febrile illness 08/28/2018   . Hematuria 08/28/2018  . Hypertension   . Long-term use of aspirin therapy 08/06/2018  . Palpitations 09/03/2018  . Pericarditis 07/20/2018  . PSVT (paroxysmal supraventricular tachycardia) (HCC) 09/03/2018  . Rectal bleeding 08/28/2018  . Situational anxiety 08/28/2018  . Syncope 07/20/2018   Past Surgical History:  Procedure Laterality Date  . ABDOMINAL HYSTERECTOMY    . CATARACT EXTRACTION W/PHACO Left 08/27/2019   Procedure: CATARACT EXTRACTION PHACO AND INTRAOCULAR LENS PLACEMENT (IOC) LEFT;  Surgeon: Lockie Mola, MD;  Location: [facility];  Service: Ophthalmology;  Laterality: Left;  6.08 0:53.6 11.4%  . CATARACT EXTRACTION W/PHACO Right 09/17/2019   Procedure: CATARACT EXTRACTION PHACO AND INTRAOCULAR LENS PLACEMENT (IOC) RIGHT;  Surgeon: Lockie Mola, MD;  Location: [facility];  Service: Ophthalmology;  Laterality: Right;  4.83 0:42.7 11.3%  . TORN MENISCUS IN LEFT KNEE Left     Current Meds  Medication Sig  . nebivolol (BYSTOLIC) 5 MG tablet Take 5 mg by mouth daily.     Allergies: Lidocaine, Rocephin [ceftriaxone], Fentanyl, Ibuprofen, Meloxicam, Morphine and related, Sulfamethoxazole-trimethoprim, and Other  Social History   Tobacco Use  . Smoking status: Never Smoker  . Smokeless tobacco: Never Used  Vaping Use  . Vaping Use: Never used  Substance Use Topics  . Alcohol use: Never  . Drug use: Never    Family History  Problem Relation Age of Onset  . Hypertension Mother   . Heart attack Mother 56  . Cancer Father   . Heart attack Maternal Grandfather 49    Review of Systems: A 12-system review of systems was performed and was negative except as noted in the HPI.  --------------------------------------------------------------------------------------------------  Physical Exam: BP 120/78 (BP Location: Left Arm, Patient Position: Sitting, Cuff Size: Large)   Pulse 74   Ht 5\' 5"  (1.651 m)   Wt 216 lb (98 kg)   SpO2 98%   BMI  35.94 kg/m   General:  NAD. Neck: No JVD or HJR, though evaluation is limited by body habitus. Lungs: Clear to auscultation bilaterally without wheezes or crackles. Heart: Regular rate and rhythm without murmurs, rubs, or gallops. Abdomen: Soft, nontender, nondistended. Extremities: No lower extremity edema.  EKG: Normal sinus rhythm without abnormality.  Lab Results  Component Value Date   WBC 6.9 01/12/2020   HGB 14.2 01/12/2020   HCT 45.2 01/12/2020   MCV 98.9 01/12/2020   PLT 175 01/12/2020    Lab Results  Component Value Date   NA 140 01/12/2020   K 3.9 01/12/2020   CL 104 01/12/2020   CO2 27 01/12/2020   BUN 16 01/12/2020   CREATININE 0.75 01/12/2020   GLUCOSE 114 (H) 01/12/2020   ALT 65 (H) 01/12/2020    No results found for: CHOL, HDL, LDLCALC, LDLDIRECT, TRIG, CHOLHDL  --------------------------------------------------------------------------------------------------  ASSESSMENT AND PLAN: Atypical chest pain and GERD: Symptoms are most likely GI in nature, given that they occur most commonly when she is lying at night and improve with famotidine.  I have recommended that she try a standing course of famotidine or OTC PPI for several weeks to see if this helps resolve her symptoms.  If she continues to have chest pain in spite of this, we will need to consider noninvasive ischemia testing though my suspicion for significant coronary insufficiency is low given atypical nature of her symptoms.  PSVT: If you palpitations noted at times.  I encouraged Erica Oneill to minimize her caffeine use, as she continues to drink diet 01/14/2020.  We will continue her current dose of nebivolol.  Hypertension: Blood pressure well controlled today.  Continue nebivolol.  Obesity: Weight has increased almost 20 pounds since 01/2020.  I encouraged Erica Oneill to work on weight loss through diet and exercise.  Follow-up: Return to clinic in 3 months to reassess chest  pain.  Gearlean Alf, MD 08/25/2020 8:34 AM

## 2020-08-25 ENCOUNTER — Ambulatory Visit (INDEPENDENT_AMBULATORY_CARE_PROVIDER_SITE_OTHER): Payer: Medicare Other | Admitting: Internal Medicine

## 2020-08-25 ENCOUNTER — Encounter: Payer: Self-pay | Admitting: Internal Medicine

## 2020-08-25 ENCOUNTER — Other Ambulatory Visit: Payer: Self-pay

## 2020-08-25 VITALS — BP 120/78 | HR 74 | Ht 65.0 in | Wt 216.0 lb

## 2020-08-25 DIAGNOSIS — I471 Supraventricular tachycardia: Secondary | ICD-10-CM

## 2020-08-25 DIAGNOSIS — I10 Essential (primary) hypertension: Secondary | ICD-10-CM | POA: Diagnosis not present

## 2020-08-25 DIAGNOSIS — K219 Gastro-esophageal reflux disease without esophagitis: Secondary | ICD-10-CM | POA: Diagnosis not present

## 2020-08-25 DIAGNOSIS — R0789 Other chest pain: Secondary | ICD-10-CM | POA: Diagnosis not present

## 2020-08-25 DIAGNOSIS — Z6835 Body mass index (BMI) 35.0-35.9, adult: Secondary | ICD-10-CM

## 2020-08-25 DIAGNOSIS — E6609 Other obesity due to excess calories: Secondary | ICD-10-CM

## 2020-08-25 NOTE — Patient Instructions (Signed)
Medication Instructions:  Your physician recommends that you continue on your current medications as directed. Please refer to the Current Medication list given to you today.  Dr End recommends you get over-the-counter Pepcid twice a day or Omeprazole as directed for a couple weeks to help with acid reflux.  *If you need a refill on your cardiac medications before your next appointment, please call your pharmacy*  Follow-Up: At [facility], you and your health needs are our priority.  As part of our continuing mission to provide you with exceptional heart care, we have created designated Provider Care Teams.  These Care Teams include your primary Cardiologist (physician) and Advanced Practice Providers (APPs -  Physician Assistants and Nurse Practitioners) who all work together to provide you with the care you need, when you need it.  We recommend signing up for the patient portal called "MyChart".  Sign up information is provided on this After Visit Summary.  MyChart is used to connect with patients for Virtual Visits (Telemedicine).  Patients are able to view lab/test results, encounter notes, upcoming appointments, etc.  Non-urgent messages can be sent to your provider as well.   To learn more about what you can do with MyChart, go to [email or web address].    Your next appointment:   3 month(s)  The format for your next appointment:   In Person  Provider:   You may see Yvonne Kendall, MD or one of the following Advanced Practice Providers on your designated Care Team:    Nicolasa Ducking, NP  Eula Listen, PA-C  Marisue Ivan, PA-C  [place], [place]  Gillian Shields, NP

## 2020-10-04 ENCOUNTER — Telehealth: Payer: Self-pay | Admitting: Internal Medicine

## 2020-10-04 NOTE — Telephone Encounter (Signed)
   Patient c/o Palpitations:  High priority if patient c/o lightheadedness, shortness of breath, or chest pain  1) How long have you had palpitations/irregular HR/ Afib? Are you having the symptoms now? 5 minute episode this weekend of afib per home monitor . No symptoms at this times.   2) Are you currently experiencing lightheadedness, SOB or CP? Not currently   3) Do you have a history of afib (atrial fibrillation) or irregular heart rhythm? no  4) Have you checked your BP or HR? (document readings if available): HR up to 160 during episode   5) Are you experiencing any other symptoms?  Patient also having issues with generic Bystolic .  She feels like heart is jittery now on the generic when she did not have this feeling with Bystolic before.    Scheduled next available with Dan Humphreys 4/13 at 11

## 2020-10-04 NOTE — Telephone Encounter (Signed)
I called and spoke with the patient to follow up on reported symptoms.  Per the patient, she felt like she had an episode of possible a-fib over the weekend that lasted ~ 5 minutes. Her HR went up to 160 bpm, but would flucuate down to 139 bpm. She finally coughed and went back to normal sinus rhythm.  She does confirm that she had some neck/ chest pain during the episode and her chest continues to be a little sore.  She did take bystolic and ASA during her event.   The patient advised she has worn a monitor before and there was a possible brief episode of a-fib noticed then.  I have reviewed her chart and she did wear a monitor on 03/2019 for Dr. Tomie China. Impression:  She is already on a beta-blocker and she should continue it. She has very rare brief fast plan of atrial arrhythmia. Unremarkable monitoring otherwise. Cc PCP Garwin Brothers, MD 04/22/2019 3:31 PM  I inquired if the patient has noticed any atrial fibrillation since she wore the monitor. She advises that she has noticed some brief possible episodes that last seconds. She will wake up sometimes from sleep feeling like heart is beating irregularly.   The patient is a retired Engineer, civil (consulting) and is aware of the signs of a-fib. I have advised her that since she is currently having no symptoms, she should follow up as planned on 10/06/20 with Gillian Shields, NP. She is aware she may require wearing another ZIO monitor to assess for atrial fibrillation and that we may need to look at a different medication as well.  The patient voices understanding and is agreeable. She also acknowledges that she should seek care in the ER if she goes in to a-fib w/ RVR and is unable to convert on her own prior to her appointment.

## 2020-10-06 ENCOUNTER — Ambulatory Visit (INDEPENDENT_AMBULATORY_CARE_PROVIDER_SITE_OTHER): Payer: Medicare Other

## 2020-10-06 ENCOUNTER — Ambulatory Visit (INDEPENDENT_AMBULATORY_CARE_PROVIDER_SITE_OTHER): Payer: Medicare Other | Admitting: Family

## 2020-10-06 ENCOUNTER — Encounter: Payer: Self-pay | Admitting: Family

## 2020-10-06 ENCOUNTER — Other Ambulatory Visit: Payer: Self-pay

## 2020-10-06 VITALS — BP 130/80 | HR 70 | Ht 65.0 in | Wt 210.0 lb

## 2020-10-06 DIAGNOSIS — R079 Chest pain, unspecified: Secondary | ICD-10-CM | POA: Diagnosis not present

## 2020-10-06 DIAGNOSIS — R931 Abnormal findings on diagnostic imaging of heart and coronary circulation: Secondary | ICD-10-CM

## 2020-10-06 DIAGNOSIS — R002 Palpitations: Secondary | ICD-10-CM | POA: Diagnosis not present

## 2020-10-06 DIAGNOSIS — I471 Supraventricular tachycardia: Secondary | ICD-10-CM | POA: Diagnosis not present

## 2020-10-06 DIAGNOSIS — R072 Precordial pain: Secondary | ICD-10-CM

## 2020-10-06 DIAGNOSIS — Z6834 Body mass index (BMI) 34.0-34.9, adult: Secondary | ICD-10-CM

## 2020-10-06 DIAGNOSIS — I10 Essential (primary) hypertension: Secondary | ICD-10-CM

## 2020-10-06 MED ORDER — METOPROLOL TARTRATE 100 MG PO TABS: 100.0000 mg | ORAL_TABLET | Freq: Once | ORAL | 0 refills | Status: DC

## 2020-10-06 MED ORDER — NEBIVOLOL HCL 5 MG PO TABS: ORAL_TABLET | ORAL | 2 refills | Status: DC

## 2020-10-06 NOTE — Patient Instructions (Addendum)
Medication Instructions:  Your physician has recommended you make the following change in your medication:   CHANGE Nevilolol (Bystolic) to 5mg  daily with an additional 5mg  as needed for palpitations  *If you need a refill on your cardiac medications before your next appointment, please call your pharmacy*  Lab Work: Your provider recommends that you return for lab work today: BMP, CBC, TSH  If you have labs (blood work) drawn today and your tests are completely normal, you will receive your results only by: MyChart Message (if you have MyChart) OR . A paper copy in the mail If you have any lab test that is abnormal or we need to change your treatment, we will call you to review the results.  Testing/Procedures: Your physician has requested that you have cardiac CT. Cardiac computed tomography (CT) is a painless test that uses an x-ray machine to take clear, detailed pictures of your heart.. Please follow instruction sheet as given.  Your cardiac CT will be scheduled at:   [facility] [address] [place], [address] [place] [phone]  [facility], please arrive 15 mins early for check-in and test prep.  Please follow these instructions carefully (unless otherwise directed):   On the Night Before the Test: . Be sure to Drink plenty of water. . Do not consume any caffeinated/decaffeinated beverages or chocolate 12 hours prior to your test. . Do not take any antihistamines 12 hours prior to your test.   On the Day of the Test: . Drink plenty of water until 1 hour prior to the test. . Do not eat any food 4 hours prior to the test. . You may take your regular medications prior to the test.  . Take metoprolol (Lopressor) two hours prior to test. . FEMALES- please wear underwire-free bra if available        After the Test: . Drink plenty of water. . After receiving IV contrast, you may experience a  mild flushed feeling. This is normal. . On occasion, you may experience a mild rash up to 24 hours after the test. This is not dangerous. If this occurs, you can take Benadryl 25 mg and increase your fluid intake. . If you experience trouble breathing, this can be serious. If it is severe call 911 IMMEDIATELY. If it is mild, please call our office. . If you take any of these medications: Glipizide/Metformin, Avandament, Glucavance, please do not take 48 hours after completing test unless otherwise instructed.   Once we have confirmed authorization from your insurance company, we will call you to set up a date and time for your test. Based on how quickly your insurance processes prior authorizations requests, please allow up to 4 weeks to be contacted for scheduling your Cardiac CT appointment. Be advised that routine Cardiac CT appointments could be scheduled as many as 8 weeks after your provider has ordered it.  For non-scheduling related questions, please contact the cardiac imaging nurse navigator should you have any questions/concerns: [phone], Cardiac Imaging Nurse Navigator [facility], Cardiac Imaging Nurse Navigator [facility] Direct Office Dial: [phone]   For scheduling needs, including cancellations and rescheduling, please call Larey Brick, [phone].    Your physician has recommended that you wear a Zio XT monitor for 14 days. This monitor is a medical device that records the heart's electrical activity. Doctors most often use these monitors to diagnose arrhythmias. Arrhythmias are problems with the speed or rhythm of  the heartbeat. The monitor is a small device applied to your chest. You can wear one while you do your normal daily activities. While wearing this monitor if you have any symptoms to push the button and record what you felt. Once you have worn this monitor for the period of time provider prescribed (Usually 14 days), you will  return the monitor device in the postage paid box. Once it is returned they will download the data collected and provide [place] with a report which the provider will then review and we will call you with those results. Important tips:  1. Avoid showering during the first 24 hours of wearing the monitor. 2. Avoid excessive sweating to help maximize wear time. 3. Do not submerge the device, no hot tubs, and no swimming pools. 4. Keep any lotions or oils away from the patch. 5. After 24 hours you may shower with the patch on. Take brief showers with your back facing the shower head.  6. Do not remove patch once it has been placed because that will interrupt data and decrease adhesive wear time. 7. Push the button when you have any symptoms and write down what you were feeling. 8. Once you have completed wearing your monitor, remove and place into box which has postage paid and place in your outgoing mailbox.  9. If for some reason you have misplaced your box then call our office and we can provide another box and/or mail it off for you.      Follow-Up: At [facility], you and your health needs are our priority.  As part of our continuing mission to provide you with exceptional heart care, we have created designated Provider Care Teams.  These Care Teams include your primary Cardiologist (physician) and Advanced Practice Providers (APPs -  Physician Assistants and Nurse Practitioners) who all work together to provide you with the care you need, when you need it.  We recommend signing up for the patient portal called "MyChart".  Sign up information is provided on this After Visit Summary.  MyChart is used to connect with patients for Virtual Visits (Telemedicine).  Patients are able to view lab/test results, encounter notes, upcoming appointments, etc.  Non-urgent messages can be sent to your provider as well.   To learn more about what you can do with MyChart, go to [email or web address].    Your  next appointment:   6 week(s)  The format for your next appointment:   In Person  Provider:   You may see Yvonne Kendall, MD or one of the following Advanced Practice Providers on your designated Care Team:    Nicolasa Ducking, NP  Eula Listen, PA-C  Marisue Ivan, PA-C  Cadence Fransico Michael, [place]  Gillian Shields, NP  Other Instructions  Your palpitations were likely an episode of paroxysmal SVT. Additional information is included below:   Supraventricular Tachycardia, Adult Supraventricular tachycardia (SVT) is a kind of abnormal heartbeat. It makes your heart beat very fast. This may last for a short time and then return to normal, or it may last longer. A normal resting heartbeat is 60-100 times a minute. This condition can make your heart beat more than 150 times a minute. Times of having a fast heartbeat (episodes) can be scary, but they are usually not dangerous. In some cases, they may lead to heart failure if they:  Happen many times a day.  Last longer than a few seconds. What are the causes? This condition happens when electrical signals are sent out  from areas of the heart that do not normally send signals for the heartbeat.   What increases the risk? You are more likely to develop this condition if you are:  Middle aged or younger.  Female. The following factors may also make you more likely to develop this condition:  Stress.  Feeling worried or nervous (anxiety).  Tiredness.  Smoking.  Stimulant drugs, such as cocaine and methamphetamine.  Alcohol.  Caffeine.  Pregnancy.  Having certain medical conditions. What are the signs or symptoms?  A pounding heart.  A feeling that your heart is skipping beats (palpitations).  Weakness.  Trouble getting enough air.  Pain or tightness in your chest.  Dizziness or feeling like you are going to pass out (faint).  Feeling worried or nervous.  Sweating.  Feeling like you may vomit  (nausea).  Passing out.  Tiredness. Sometimes, there are no symptoms. How is this treated? Treatment may include:  Vagal nerve stimulation. Ways to do this include: ? Holding your breath and pushing, as though you are pooping (having a bowel movement). ? Massaging an area on one side of your neck. Do not try this yourself. Only a doctor should do this. If done the wrong way, it can lead to a stroke. ? Bending forward with your head between your legs. ? Coughing while bending forward with your head between your legs. ? Putting an ice-cold, wet towel on your face.  Medicines that prevent attacks.  Medicine to stop an attack given through an IV tube at the hospital.  A small electric shock (cardioversion) that stops an attack.  A procedure to get rid of cells in the area that is causing the fast heartbeats (radiofrequency ablation). If you do not have symptoms, you may not need treatment. Follow these instructions at home: Stress  Avoid things that make you feel stressed.  To deal with stress, try: ? Doing yoga or meditation. ? Being out in nature. ? Listening to relaxing music. ? Doing deep breathing. ? Taking steps to be healthy, such as getting lots of sleep, exercising, and eating a balanced diet. ? Talking with a mental health doctor. Lifestyle  Try to get at least 7 hours of sleep each night.  Do not smoke or use any products that contain nicotine or tobacco. If you need help quitting, ask your doctor.  Do not drink alcohol if it gives you a fast heartbeat.  If alcohol does not seem to give you a fast heartbeat, limit your alcohol use. If you drink alcohol: ? Limit how much you have to:  0-1 drink a day for women who are not pregnant.  0-2 drinks a day for men. ? Know how much alcohol is in your drink. In the [place] one drink equals one 12 oz bottle of beer (355 mL), one 5 oz glass of wine (148 mL), or one 1 oz glass of hard liquor (44 mL).  Be aware of how  caffeine affects you. ? If caffeine gives you a fast heartbeat, do not eat, drink, or use anything with caffeine in it. ? If caffeine does not seem to give you a fast heartbeat, limit how much caffeine you eat, drink, or use.  Do not use stimulant drugs. If you need help quitting, ask your doctor.   General instructions  Stay at a healthy weight.  Exercise regularly. Ask your doctor about good activities for you. Try one or a mixture of these: ? 150 minutes a week of gentle exercise, like  walking or yoga. ? 75 minutes a week of exercise that is very active, like running or swimming.  Do vagus nerve treatments to slow down your heartbeat as told by your doctor.  Take over-the-counter and prescription medicines only as told by your doctor.  Keep all follow-up visits. Contact a doctor if:  You have a fast heartbeat more often.  Times of having a fast heartbeat last longer than before.  Home treatments to slow down your heartbeat do not help.  You have new symptoms. Get help right away if:  You have chest pain.  Your symptoms get worse.  You have trouble breathing.  Your heart beats very fast for more than 20 minutes.  You pass out. These symptoms may be an emergency. Get medical help right away. Call your local emergency services (911 in the [place]  Do not wait to see if the symptoms will go away.  Do not drive yourself to the hospital. Summary  SVT is a type of abnormal heartbeat.  This condition can make your heart beat more than 150 times a minute.  If you do not have symptoms, you may not need treatment. This information is not intended to replace advice given to you by your health care provider. Make sure you discuss any questions you have with your health care provider. Document Revised: 01/24/2020 Document Reviewed: 01/24/2020 Elsevier Patient Education  2021 [employer]

## 2020-10-06 NOTE — Progress Notes (Signed)
Office Visit    Patient Name: Erica Oneill Date of Encounter: 10/06/2020  Primary Care Provider:  Hadley Pen, MD Primary Cardiologist:  Yvonne Kendall, MD Electrophysiologist:  None   Chief Complaint    Erica Oneill is a 65 y.o. female with a hx of pericarditis, PSVT, HTN, syncope, COVID-19 infection requiring hospitalization 12/2019 presents today for palpitations.  Past Medical History    Past Medical History:  Diagnosis Date  . Acute cystitis with hematuria 12/13/2018  . Arthralgia of multiple sites 08/28/2018  . COVID-19 12/2019  . Essential hypertension 09/03/2018  . Febrile illness 08/28/2018  . Hematuria 08/28/2018  . Hypertension   . Long-term use of aspirin therapy 08/06/2018  . Palpitations 09/03/2018  . Pericarditis 07/20/2018  . PSVT (paroxysmal supraventricular tachycardia) (HCC) 09/03/2018  . Rectal bleeding 08/28/2018  . Situational anxiety 08/28/2018  . Syncope 07/20/2018   Past Surgical History:  Procedure Laterality Date  . ABDOMINAL HYSTERECTOMY    . CATARACT EXTRACTION W/PHACO Left 08/27/2019   Procedure: CATARACT EXTRACTION PHACO AND INTRAOCULAR LENS PLACEMENT (IOC) LEFT;  Surgeon: Lockie Mola, MD;  Location[facility];  Service: Ophthalmology;  Laterality: Left;  6.08 0:53.6 11.4%  . CATARACT EXTRACTION W/PHACO Right 09/17/2019   Procedure: CATARACT EXTRACTION PHACO AND INTRAOCULAR LENS PLACEMENT (IOC) RIGHT;  Surgeon: Lockie Mola, MD;  Location: [facility];  Service: Ophthalmology;  Laterality: Right;  4.83 0:42.7 11.3%  . TORN MENISCUS IN LEFT KNEE Left     Allergies  Allergies  Allergen Reactions  . Lidocaine Anaphylaxis  . Rocephin [Ceftriaxone] Anaphylaxis  . Fentanyl     Hypotension   . Ibuprofen     tachycardia  . Meloxicam     tachycardia  . Morphine And Related     Hypotension   . Sulfamethoxazole-Trimethoprim Diarrhea and Nausea And Vomiting       . Other Rash and Hypertension     seafood    History of Present Illness    Erica Oneill is a 65 y.o. female with a hx of pericarditis, PSVT, HTN, syncope, COVID-19 infection requiring hospitalization 12/2019 last seen 08/25/20 by Dr. Okey Dupre.  Previously treated January 2020 for acute pericarditis.  ZIO monitor October 2020 with predominantly sinus rhythm.  She had 39-second atrial run with maximum HR of 169 bpm. She had occasional PVCs.  She was recommended to continue her dose of beta-blocker.  She was hospitalized with COVID-19 01/11/2020-01/13/2020 and treated remdesivir, Decadron.  Previously followed with Dr. Tomie China but has elected to switch her care to the [facility] office.  When seen 02/11/2020 she had had chest x-ray showing cardiomegaly and pulmonary vascular congestion though images unable for my review.  In clinic she noted soreness in her chest which was tender along the sternum.  It was somewhat reminiscent of her previous pericarditis pain.  She was starting to feel somewhat better.  Subsequent echocardiogram 02/12/2020 with LVEF 60 to 65%, no regional wall motion normalities, grade 1 diastolic dysfunction, RV normal size and function, no significant valvular abnormalities.  At follow-up September 2021 she noted an improvement in her symptoms and that her palpitations were well controlled.  She had no recurrent chest discomfort.  She was seen by Dr. Okey Dupre 08/25/2020 and noted a few episodes of left-sided chest wall pain as well as indigestion occurring mostly at night when she is lying on her left side.  No exertional chest pain and symptoms improved with as needed Pepcid AC.  She was recommended to trial  a standing course of famotidine or over-the-counter PPI to see if pain improved.  She was also recommended to reduce her intake of caffeine as this was thought to be contributory to her palpitations.  She called the office for 7/22 noting 5-minute episode of possible atrial fibrillation on her home monitor.  She noted that  she felt the generic Bystolic was not as effective as the name brand.  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 Her husband is a Optician, dispensing. 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  When asked to describe the event she tells me she was studying her Sunday school lesson after a large dinner to celebrate her birthday with family and noted palpitations. Tells me her chest feels very sore after the episode and remains tender on palpation. She has not had recurrent indigestion symptoms and is not taking Pepcid nor PPI. Since the episode of palpitations lasting 5 minutes she has been taking Bystolic 5mg  twice per day. Tells me her heart feels kind of "quivery" and "skippy". She attributes this to taking generic Bystolic. She has stopped drinking caffeine since Saturday. She has not been requiring Pepcid nor PPI.   Note congested cough of clear mucus for about 2-3 weeks after her URI. This is gradually improving. She is worried about heart failure but reports no edema, orthopnea, nor PND. Tells me she is heavier than she has been in the pat but has lost 6 pounds over the last month by watching her diet.  She does note exertional dyspnea and some occasional exertional chest discomfort.   EKGs/Labs/Other Studies Reviewed:   The following studies were reviewed today:  Echo 02/12/2020  1. Left ventricular ejection fraction, by estimation, is 60 to 65%. The  left ventricle has normal function. The left ventricle has no regional  wall motion abnormalities. Left ventricular diastolic parameters are  consistent with Grade I diastolic  dysfunction (impaired relaxation).   2. Right ventricular systolic function is normal. The right ventricular  size is normal. There is normal pulmonary artery systolic pressure.   EKG:  EKG is ordered today.  The ekg ordered today demonstrates NSR 83 bpm with no acute ST/T wave changes.   Recent Labs: 01/11/2020: B Natriuretic  Peptide 25.8 01/12/2020: ALT 65; BUN 16; Creatinine, Ser 0.75; Hemoglobin 14.2; Platelets 175; Potassium 3.9; Sodium 140  Recent Lipid Panel No results found for: CHOL, TRIG, HDL, CHOLHDL, VLDL, LDLCALC, LDLDIRECT  Home Medications   Current Meds  Medication Sig  . metoprolol tartrate (LOPRESSOR) 100 MG tablet Take 1 tablet (100 mg total) by mouth once for 1 dose. Take TWO hours prior to CT procedure    Review of Systems   Review of Systems  Constitutional: Negative for chills, fever and malaise/fatigue.  Cardiovascular: Positive for chest pain and dyspnea on exertion. Negative for leg swelling, near-syncope, orthopnea, palpitations and syncope.  Respiratory: Positive for cough. Negative for shortness of breath and wheezing.   Gastrointestinal: Negative for nausea and vomiting.  Neurological: Negative for dizziness, light-headedness and weakness.   All other systems reviewed and are otherwise negative except as noted above.  Physical Exam    VS:  BP 130/80 (BP Location: Left Arm, Patient Position: Sitting, Cuff Size: Large)   Pulse 70   Ht 5\' 5"  (1.651 m)   Wt 210 lb (95.3 kg)   SpO2 98%   BMI 34.95 kg/m  , BMI Body mass index is 34.95 kg/m.  GEN: Well nourished, overweight, well developed, in no acute distress. HEENT: normal. Neck: Supple, no JVD, carotid bruits, or masses. Cardiac: RRR, no murmurs, rubs, or gallops. No clubbing, cyanosis, edema.  Radials/PT 2+ and equal bilaterally.  Respiratory:  Respirations regular and unlabored, clear to auscultation bilaterally. GI: Soft, nontender, nondistended. MS: No deformity or atrophy. Skin: Warm and dry, no rash. Neuro:  Strength and sensation are intact. Psych: Normal affect.   Assessment & Plan    1. Chest pain - Notes exertional dyspnea and chest discomfort. EKG today NSR with no acute ST/T wave changes. Risk factors include family history of coronary artery disease, HLD, weight. Plan for cardiac CTA. If evidence of  coronary artery disease will require aspirin and lipid management.  2. Hx of pericarditis - No evidence of recurrence by EKG today. Some mild chest soreness which is likely related to muscle tension after recent palpitations event. No indication for colchicine at this time.   3. Palpitations / PSVT - 39 second of PSVT noted on previous monitor 03/2019. Reviewed with patient that she has no known history of atrial fib and she was reassured. Episode a few nights ago of tachycardia and palpitations lasting 5 minutes relieved by Bisoprolol 5mg  and cough. Likely etiology PSVT. 14 day ZIO placed today to rule out atrial fibrillation. BMP, TSH, CBC today to rule out electrolyte abnormality, anemia, thyroid dysfunction as contributory. Continue Bystolic 5mg  daily. She may take an additional 5mg  as needed for palpitations. If she is requiring frequently, consider transition to BID dosing. Her Reds Vest today was 23% with no orthopnea, PND, edema - most recent echo <1 year ago with no significant valvular abnormalities - will defer repeat imaging.  4. HTN - BP well controlled. Continue current antihypertensive regimen.   5. Obesity - Weight loss via diet and exercise encouraged. Discussed the impact being overweight would have on cardiovascular risk.  Disposition: Follow up in 6 week(s) with Dr. or APP  , NP 10/06/2020, 1:11 PM

## 2020-10-07 LAB — CBC
Hematocrit: 47 % — ABNORMAL HIGH (ref 34.0–46.6)
Hemoglobin: 15.9 g/dL (ref 11.1–15.9)
MCH: 32.1 pg (ref 26.6–33.0)
MCHC: 33.8 g/dL (ref 31.5–35.7)
MCV: 95 fL (ref 79–97)
Platelets: 211 10*3/uL (ref 150–450)
RBC: 4.95 x10E6/uL (ref 3.77–5.28)
RDW: 11.3 % — ABNORMAL LOW (ref 11.7–15.4)
WBC: 9.6 10*3/uL (ref 3.4–10.8)

## 2020-10-07 LAB — BASIC METABOLIC PANEL
BUN/Creatinine Ratio: 14 (ref 12–28)
BUN: 13 mg/dL (ref 8–27)
CO2: 20 mmol/L (ref 20–29)
Calcium: 9.9 mg/dL (ref 8.7–10.3)
Chloride: 101 mmol/L (ref 96–106)
Creatinine, Ser: 0.91 mg/dL (ref 0.57–1.00)
Glucose: 91 mg/dL (ref 65–99)
Potassium: 4.3 mmol/L (ref 3.5–5.2)
Sodium: 138 mmol/L (ref 134–144)
eGFR: 69 mL/min/{1.73_m2} (ref >59)

## 2020-10-07 LAB — TSH: TSH: 2.71 u[IU]/mL (ref 0.450–4.500)

## 2020-10-14 ENCOUNTER — Other Ambulatory Visit (HOSPITAL_COMMUNITY): Payer: BLUE CROSS/BLUE SHIELD

## 2020-10-19 DIAGNOSIS — I471 Supraventricular tachycardia: Secondary | ICD-10-CM | POA: Diagnosis not present

## 2020-10-19 DIAGNOSIS — R002 Palpitations: Secondary | ICD-10-CM

## 2020-10-27 ENCOUNTER — Telehealth (HOSPITAL_COMMUNITY): Payer: Self-pay | Admitting: Emergency Medicine

## 2020-10-27 NOTE — Telephone Encounter (Signed)
Reaching out to patient to offer assistance regarding upcoming cardiac imaging study; pt verbalizes understanding of appt date/time, parking situation and where to check in, pre-test NPO status and medications ordered, and verified current allergies; name and call back number provided for further questions should they arise Rockwell Alexandria RN Navigator Cardiac Imaging [facility] [phone] office [phone] cell  Holding bystolic + taking 100mg  metoprolol tart 

## 2020-10-28 ENCOUNTER — Ambulatory Visit
Admission: RE | Admit: 2020-10-28 | Discharge: 2020-10-28 | Disposition: A | Discharge status: Home / Self Care | Payer: Medicare Other | Source: Ambulatory Visit | Attending: Family | Admitting: Family

## 2020-10-28 ENCOUNTER — Other Ambulatory Visit: Payer: Self-pay

## 2020-10-28 DIAGNOSIS — R072 Precordial pain: Secondary | ICD-10-CM | POA: Diagnosis present

## 2020-10-28 MED ORDER — IOHEXOL 350 MG/ML SOLN
100.0000 mL | Freq: Once | INTRAVENOUS | Status: AC | PRN
  Administered 2020-10-28: 100 mL via INTRAVENOUS

## 2020-10-28 MED ORDER — NITROGLYCERIN 0.4 MG SL SUBL
0.8000 mg | SUBLINGUAL_TABLET | Freq: Once | SUBLINGUAL | Status: AC
  Administered 2020-10-28: 0.8 mg via SUBLINGUAL

## 2020-10-28 NOTE — Progress Notes (Signed)
Patient tolerated procedure well. Ambulate w/o difficulty. Sitting in chair drinking water provided. Encouraged to drink extra water today and reasoning explained. Verbalized understanding. All questions answered. ABC intact. No further needs. Discharge from procedure area w/o issues.  

## 2020-11-18 ENCOUNTER — Ambulatory Visit (INDEPENDENT_AMBULATORY_CARE_PROVIDER_SITE_OTHER): Payer: Medicare Other | Admitting: Internal Medicine

## 2020-11-18 ENCOUNTER — Other Ambulatory Visit: Payer: Self-pay

## 2020-11-18 ENCOUNTER — Encounter: Payer: Self-pay | Admitting: Internal Medicine

## 2020-11-18 VITALS — BP 140/80 | HR 55 | Ht 65.0 in | Wt 199.0 lb

## 2020-11-18 DIAGNOSIS — R079 Chest pain, unspecified: Secondary | ICD-10-CM | POA: Diagnosis not present

## 2020-11-18 DIAGNOSIS — I471 Supraventricular tachycardia: Secondary | ICD-10-CM

## 2020-11-18 DIAGNOSIS — R002 Palpitations: Secondary | ICD-10-CM | POA: Diagnosis not present

## 2020-11-18 DIAGNOSIS — I10 Essential (primary) hypertension: Secondary | ICD-10-CM | POA: Diagnosis not present

## 2020-11-18 NOTE — Progress Notes (Signed)
Follow-up Outpatient Visit Date: 11/18/2020  Primary Care Provider: Hadley Pen, MD [address] [place] [address]  Chief Complaint: Follow-up chest pain  HPI:  Ms. Melick is a 65 y.o. female with history of pericarditis, PSVT, hypertension, syncope, and hospitalization for COVID-19 infection (12/2019), who presents for follow-up of chest pain and history of pericarditis.  I last saw her in early March, at which time she reported a few episodes of indigestion and left-sided chest pain, especially at night when lying in the left lateral decubitus position.  Pain was associated with deep inspiration and was reminiscent of what she had felt in the past with pericarditis.  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  This was normal without evidence of CAD.  No pericardial effusion was observed.  Today, Mr. Swartzentruber reports that she is feeling well without further chest pain.  She happily reports that she has lost 11 pounds and feels like this has made a big difference.  She denies palpitations, shortness of breath, and edema.  She notes that her heart rates are sometimes a little bit low, albeit without symptoms.  She remains on Bystolic.  --------------------------------------------------------------------------------------------------  Past Medical History:  Diagnosis Date  . Acute cystitis with hematuria 12/13/2018  . Arthralgia of multiple sites 08/28/2018  . COVID-19 12/2019  . Essential hypertension 09/03/2018  . Febrile illness 08/28/2018  . Hematuria 08/28/2018  . Hypertension   . Long-term use of aspirin therapy 08/06/2018  . Palpitations 09/03/2018  . Pericarditis 07/20/2018  . PSVT (paroxysmal supraventricular tachycardia) (HCC) 09/03/2018  . Rectal bleeding 08/28/2018  . Situational anxiety 08/28/2018  . Syncope 07/20/2018   Past Surgical History:  Procedure  Laterality Date  . ABDOMINAL HYSTERECTOMY    . CATARACT EXTRACTION W/PHACO Left 08/27/2019   Procedure: CATARACT EXTRACTION PHACO AND INTRAOCULAR LENS PLACEMENT (IOC) LEFT;  Surgeon: Lockie Mola, MD;  Location: [facility];  Service: Ophthalmology;  Laterality: Left;  6.08 0:53.6 11.4%  . CATARACT EXTRACTION W/PHACO Right 09/17/2019   Procedure: CATARACT EXTRACTION PHACO AND INTRAOCULAR LENS PLACEMENT (IOC) RIGHT;  Surgeon: Lockie Mola, MD;  Location: [facility];  Service: Ophthalmology;  Laterality: Right;  4.83 0:42.7 11.3%  . TORN MENISCUS IN LEFT KNEE Left     No outpatient medications have been marked as taking for the 11/18/20 encounter (Office Visit) with Minoru Chap, Cristal Deer, MD.    Allergies: Lidocaine, Rocephin [ceftriaxone], Fentanyl, Ibuprofen, Meloxicam, Morphine and related, Sulfamethoxazole-trimethoprim, and Other  Social History   Tobacco Use  . Smoking status: Never Smoker  . Smokeless tobacco: Never Used  Vaping Use  . Vaping Use: Never used  Substance Use Topics  . Alcohol use: Never  . Drug use: Never    Family History  Problem Relation Age of Onset  . Hypertension Mother   . Heart attack Mother 60  . Cancer Father   . Heart attack Maternal Grandfather 49    Review of Systems: A 12-system review of systems was performed and was negative except as noted in the HPI.  --------------------------------------------------------------------------------------------------  Physical Exam: BP 140/80 (BP Location: Left Arm, Patient Position: Sitting, Cuff Size: Large)   Pulse (!) 55   Ht 5\' 5"  (1.651 m)   Wt 199 lb (90.3 kg)   SpO2 98%   BMI 33.12 kg/m   General:  NAD. Neck: No JVD or HJR. Lungs: Clear to auscultation  bilaterally without wheezes or crackles. Heart: Regular rate and rhythm without murmurs, rubs, or gallops. Abdomen: Soft, nontender, nondistended. Extremities: No lower extremity edema.  EKG: Normal sinus  rhythm with borderline LVH.  No significant abnormality.  Lab Results  Component Value Date   WBC 9.6 10/06/2020   HGB 15.9 10/06/2020   HCT 47.0 (H) 10/06/2020   MCV 95 10/06/2020   PLT 211 10/06/2020    Lab Results  Component Value Date   NA 138 10/06/2020   K 4.3 10/06/2020   CL 101 10/06/2020   CO2 20 10/06/2020   BUN 13 10/06/2020   CREATININE 0.91 10/06/2020   GLUCOSE 91 10/06/2020   ALT 65 (H) 01/12/2020    No results found for: CHOL, HDL, LDLCALC, LDLDIRECT, TRIG, CHOLHDL  --------------------------------------------------------------------------------------------------  ASSESSMENT AND PLAN: Chest pain: Symptoms have resolved following her last visit.  Coronary CTA was reassuring without evidence of significant CAD or pericardial effusion.  No further work-up is recommended at this time.  Palpitations and PSVT: Palpitations have resolved.  We will plan to continue low-dose Bystolic.  Hypertension: Blood pressure borderline today but typically better at home.  No further medication changes recommended at this time.  Follow-up: Return to clinic in 1 year.  Yvonne Kendall, MD 11/18/2020 11:21 AM

## 2020-11-18 NOTE — Patient Instructions (Signed)
Medication Instructions:  No changes at this time.   *If you need a refill on your cardiac medications before your next appointment, please call your pharmacy*   Lab Work: None  If you have labs (blood work) drawn today and your tests are completely normal, you will receive your results only by: Marland Kitchen MyChart Message (if you have MyChart) OR . A paper copy in the mail If you have any lab test that is abnormal or we need to change your treatment, we will call you to review the results.   Testing/Procedures: None   Follow-Up: At [facility], you and your health needs are our priority.  As part of our continuing mission to provide you with exceptional heart care, we have created designated Provider Care Teams.  These Care Teams include your primary Cardiologist (physician) and Advanced Practice Providers (APPs -  Physician Assistants and Nurse Practitioners) who all work together to provide you with the care you need, when you need it.   Your next appointment:   1 year(s)  The format for your next appointment:   In Person  Provider:   You may see Yvonne Kendall, MD or one of the following Advanced Practice Providers on your designated Care Team:    Nicolasa Ducking, NP  Eula Listen, PA-C  Marisue Ivan, PA-C  Cadence [place], [place]  Gillian Shields, NP

## 2020-11-19 ENCOUNTER — Encounter: Payer: Self-pay | Admitting: Internal Medicine

## 2020-12-22 ENCOUNTER — Ambulatory Visit: Payer: BLUE CROSS/BLUE SHIELD | Admitting: Internal Medicine

## 2021-11-23 NOTE — Progress Notes (Deleted)
Follow-up Outpatient Visit Date: 11/25/2021  Primary Care Provider: Hadley Pen, MD [address] [place] [address]  Chief Complaint: ***  HPI:  Ms. Brockway is a 66 y.o. female with history of pericarditis, PSVT, hypertension, syncope, and hospitalization for COVID-19 infection (12/2019), who presents for follow-up of chest pain and a history of pericarditis.  I last saw her a year ago, at which time Ms. Pokorney was feeling well without any further chest pain.  Preceding coronary CTA showed no evidence of CAD or pericardial effusion.  We did not make any medication changes or pursue additional testing at that time.  --------------------------------------------------------------------------------------------------  Past Medical History:  Diagnosis Date   Acute cystitis with hematuria 12/13/2018   Arthralgia of multiple sites 08/28/2018   COVID-19 12/2019   Essential hypertension 09/03/2018   Febrile illness 08/28/2018   Hematuria 08/28/2018   Hypertension    Long-term use of aspirin therapy 08/06/2018   Palpitations 09/03/2018   Pericarditis 07/20/2018   PSVT (paroxysmal supraventricular tachycardia) (HCC) 09/03/2018   Rectal bleeding 08/28/2018   Situational anxiety 08/28/2018   Syncope 07/20/2018   Past Surgical History:  Procedure Laterality Date   ABDOMINAL HYSTERECTOMY     CATARACT EXTRACTION W/PHACO Left 08/27/2019   Procedure: CATARACT EXTRACTION PHACO AND INTRAOCULAR LENS PLACEMENT (IOC) LEFT;  Surgeon: Lockie Mola, MD;  Location: [facility];  Service: Ophthalmology;  Laterality: Left;  6.08 0:53.6 11.4%   CATARACT EXTRACTION W/PHACO Right 09/17/2019   Procedure: CATARACT EXTRACTION PHACO AND INTRAOCULAR LENS PLACEMENT (IOC) RIGHT;  Surgeon: Lockie Mola, MD;  Location: [facility];  Service: Ophthalmology;  Laterality: Right;  4.83 0:42.7 11.3%   TORN MENISCUS IN LEFT KNEE Left     Recent CV Pertinent Labs: Lab Results  Component  Value Date   BNP 25.8 01/11/2020   K 4.3 10/06/2020   BUN 13 10/06/2020   CREATININE 0.91 10/06/2020    Past medical and surgical history were reviewed and updated in EPIC.  No outpatient medications have been marked as taking for the 11/25/21 encounter (Appointment) with Crescentia Boutwell, Cristal Deer, MD.    Allergies: Lidocaine, Rocephin [ceftriaxone], Fentanyl, Ibuprofen, Meloxicam, Morphine and related, Sulfamethoxazole-trimethoprim, and Other  Social History   Tobacco Use   Smoking status: Never   Smokeless tobacco: Never  Vaping Use   Vaping Use: Never used  Substance Use Topics   Alcohol use: Never   Drug use: Never    Family History  Problem Relation Age of Onset   Hypertension Mother    Heart attack Mother 33   Cancer Father    Heart attack Maternal Grandfather 70    Review of Systems: A 12-system review of systems was performed and was negative except as noted in the HPI.  --------------------------------------------------------------------------------------------------  Physical Exam: There were no vitals taken for this visit.  General:  NAD. Neck: No JVD or HJR. Lungs: Clear to auscultation bilaterally without wheezes or crackles. Heart: Regular rate and rhythm without murmurs, rubs, or gallops. Abdomen: Soft, nontender, nondistended. Extremities: No lower extremity edema.  EKG:  ***  Lab Results  Component Value Date   WBC 9.6 10/06/2020   HGB 15.9 10/06/2020   HCT 47.0 (H) 10/06/2020   MCV 95 10/06/2020   PLT 211 10/06/2020    Lab Results  Component Value Date   NA 138 10/06/2020   K 4.3 10/06/2020   CL 101 10/06/2020   CO2 20 10/06/2020   BUN 13 10/06/2020   CREATININE 0.91 10/06/2020  GLUCOSE 91 10/06/2020   ALT 65 (H) 01/12/2020    No results found for: CHOL, HDL, LDLCALC, LDLDIRECT, TRIG, CHOLHDL  --------------------------------------------------------------------------------------------------  ASSESSMENT AND PLAN: ***  Yvonne Kendall, MD 11/23/2021 2:24 PM

## 2021-11-25 ENCOUNTER — Ambulatory Visit: Payer: Medicare Other | Admitting: Internal Medicine

## 2022-02-22 ENCOUNTER — Other Ambulatory Visit
Admission: RE | Admit: 2022-02-22 | Discharge: 2022-02-22 | Disposition: A | Discharge status: Home / Self Care | Payer: Medicare Other | Source: Ambulatory Visit | Attending: Internal Medicine | Admitting: Internal Medicine

## 2022-02-22 ENCOUNTER — Ambulatory Visit: Payer: Medicare Other | Attending: Internal Medicine | Admitting: Internal Medicine

## 2022-02-22 ENCOUNTER — Telehealth: Payer: Self-pay | Admitting: Internal Medicine

## 2022-02-22 ENCOUNTER — Encounter: Payer: Self-pay | Admitting: Internal Medicine

## 2022-02-22 VITALS — BP 140/90 | HR 68 | Ht 64.0 in | Wt 207.0 lb

## 2022-02-22 DIAGNOSIS — R002 Palpitations: Secondary | ICD-10-CM | POA: Insufficient documentation

## 2022-02-22 DIAGNOSIS — R079 Chest pain, unspecified: Secondary | ICD-10-CM | POA: Insufficient documentation

## 2022-02-22 DIAGNOSIS — I471 Supraventricular tachycardia: Secondary | ICD-10-CM | POA: Diagnosis present

## 2022-02-22 DIAGNOSIS — R000 Tachycardia, unspecified: Secondary | ICD-10-CM | POA: Insufficient documentation

## 2022-02-22 DIAGNOSIS — I10 Essential (primary) hypertension: Secondary | ICD-10-CM | POA: Insufficient documentation

## 2022-02-22 LAB — COMPREHENSIVE METABOLIC PANEL
ALT: 53 U/L — ABNORMAL HIGH (ref 0–44)
AST: 43 U/L — ABNORMAL HIGH (ref 15–41)
Albumin: 4 g/dL (ref 3.5–5.0)
Alkaline Phosphatase: 58 U/L (ref 38–126)
Anion gap: 10 (ref 5–15)
BUN: 15 mg/dL (ref 8–23)
CO2: 27 mmol/L (ref 22–32)
Calcium: 9.4 mg/dL (ref 8.9–10.3)
Chloride: 104 mmol/L (ref 98–111)
Creatinine, Ser: 0.89 mg/dL (ref 0.44–1.00)
GFR, Estimated: >60 mL/min (ref >60)
Glucose, Bld: 104 mg/dL — ABNORMAL HIGH (ref 70–99)
Potassium: 4.1 mmol/L (ref 3.5–5.1)
Sodium: 141 mmol/L (ref 135–145)
Total Bilirubin: 1.3 mg/dL — ABNORMAL HIGH (ref 0.3–1.2)
Total Protein: 7.9 g/dL (ref 6.5–8.1)

## 2022-02-22 LAB — CBC
HCT: 48 % — ABNORMAL HIGH (ref 36.0–46.0)
Hemoglobin: 15.3 g/dL — ABNORMAL HIGH (ref 12.0–15.0)
MCH: 31.8 pg (ref 26.0–34.0)
MCHC: 31.9 g/dL (ref 30.0–36.0)
MCV: 99.8 fL (ref 80.0–100.0)
Platelets: 211 10*3/uL (ref 150–400)
RBC: 4.81 MIL/uL (ref 3.87–5.11)
RDW: 12.5 % (ref 11.5–15.5)
WBC: 10 10*3/uL (ref 4.0–10.5)
nRBC: 0 % (ref 0.0–0.2)

## 2022-02-22 LAB — MAGNESIUM: Magnesium: 2.4 mg/dL (ref 1.7–2.4)

## 2022-02-22 LAB — TSH: TSH: 3.122 u[IU]/mL (ref 0.350–4.500)

## 2022-02-22 MED ORDER — NEBIVOLOL HCL 10 MG PO TABS: 10.0000 mg | ORAL_TABLET | Freq: Every day | ORAL | 0 refills | Status: DC

## 2022-02-22 MED ORDER — NEBIVOLOL HCL 10 MG PO TABS: 10.0000 mg | ORAL_TABLET | Freq: Every day | ORAL | 6 refills | Status: DC

## 2022-02-22 NOTE — Progress Notes (Unsigned)
Follow-up Outpatient Visit Date: 02/22/2022  Primary Care Provider: Hadley Pen, MD [address] [place] [address]  Chief Complaint: Palpitations  HPI:  Ms. Champagne is a 66 y.o. female with history of pericarditis, PSVT, hypertension, syncope, and hospitalization for COVID-19 infection (12/2019), who presents for follow-up of chest pain and pericarditis.  I last saw her in 10/2020, at which time she was feeling well without further chest pain.  We did not make any medication changes or pursue further testing.  Mr. Delford Field was feeling well until 2 days ago when she had an episode of elevated heart rate and palpitations.  She had just eaten a very large meal, which has been a precipitant for palpitations/tachycardia in the past.  She felt her heart rate gradually increase in her heart rate, up to 180 bpm.  She felt her heart beating fast with associated chest tightness rating to the left arm.  She was evaluated by EMS, with twelve-lead EKG demonstrating a narrow complex tachycardia most consistent with sinus tachycardia though long RP SVT could not be excluded.  She took an additional dose of her nebivolol with gradual normalization of her heart rate over the course of a few minutes.  She did not go to the ER.  She reports similar in the past though never as fast/prolonged.  Yesterday, she felt tired and a little sore in the chest, though she is back to her baseline today.  She had not had any chest pain leading up to this.  She denies shortness of breath and lightheadedness as well as edema.  --------------------------------------------------------------------------------------------------  Past Medical History:  Diagnosis Date   Acute cystitis with hematuria 12/13/2018   Arthralgia of multiple sites 08/28/2018   COVID-19 12/2019   Essential hypertension 09/03/2018   Febrile illness 08/28/2018   Hematuria 08/28/2018   Hypertension    Long-term use of aspirin therapy 08/06/2018    Palpitations 09/03/2018   Pericarditis 07/20/2018   PSVT (paroxysmal supraventricular tachycardia) (HCC) 09/03/2018   Rectal bleeding 08/28/2018   Situational anxiety 08/28/2018   Syncope 07/20/2018   Past Surgical History:  Procedure Laterality Date   ABDOMINAL HYSTERECTOMY     CATARACT EXTRACTION W/PHACO Left 08/27/2019   Procedure: CATARACT EXTRACTION PHACO AND INTRAOCULAR LENS PLACEMENT (IOC) LEFT;  Surgeon: Lockie Mola, MD;  Location: [facility];  Service: Ophthalmology;  Laterality: Left;  6.08 0:53.6 11.4%   CATARACT EXTRACTION W/PHACO Right 09/17/2019   Procedure: CATARACT EXTRACTION PHACO AND INTRAOCULAR LENS PLACEMENT (IOC) RIGHT;  Surgeon: Lockie Mola, MD;  Location: [facility];  Service: Ophthalmology;  Laterality: Right;  4.83 0:42.7 11.3%   TORN MENISCUS IN LEFT KNEE Left     Current Meds  Medication Sig   aspirin EC 81 MG tablet Take 81 mg by mouth daily. Swallow whole.   nebivolol (BYSTOLIC) 5 MG tablet Take one tablet (5mg ) daily in the morning. Take an additional tablet as needed for elevated heart rate or palpitations.    Allergies: Doxycycline, Lidocaine, Rocephin [ceftriaxone], Fentanyl, Ibuprofen, Meloxicam, Morphine and related, Sulfamethoxazole-trimethoprim, and Other  Social History   Tobacco Use   Smoking status: Never   Smokeless tobacco: Never  Vaping Use   Vaping Use: Never used  Substance Use Topics   Alcohol use: Never   Drug use: Never    Family History  Problem Relation Age of Onset   Heart disease Mother    Hypertension Mother    Heart attack Mother 27   Cancer Father  Heart attack Maternal Grandfather 49    Review of Systems: A 12-system review of systems was performed and was negative except as noted in the HPI.  --------------------------------------------------------------------------------------------------  Physical Exam: BP (!) 140/90 (BP Location: Left Arm, Patient Position: Sitting, Cuff  Size: Large)   Pulse 68   Ht 5\' 4"  (1.626 m)   Wt 207 lb (93.9 kg)   SpO2 98%   BMI 35.53 kg/m  Repeat BP: 140/82  General:  NAD. Neck: No JVD or HJR. Lungs: Clear to auscultation bilaterally without wheezes or crackles. Heart: Regular rate and rhythm without murmurs, rubs, or gallops. Abdomen: Soft, nontender, nondistended. Extremities: No lower extremity edema.  EKG: Normal sinus rhythm with borderline LVH.  Lab Results  Component Value Date   WBC 9.6 10/06/2020   HGB 15.9 10/06/2020   HCT 47.0 (H) 10/06/2020   MCV 95 10/06/2020   PLT 211 10/06/2020    Lab Results  Component Value Date   NA 138 10/06/2020   K 4.3 10/06/2020   CL 101 10/06/2020   CO2 20 10/06/2020   BUN 13 10/06/2020   CREATININE 0.91 10/06/2020   GLUCOSE 91 10/06/2020   ALT 65 (H) 01/12/2020    No results found for: "CHOL", "HDL", "LDLCALC", "LDLDIRECT", "TRIG", "CHOLHDL"  --------------------------------------------------------------------------------------------------  ASSESSMENT AND PLAN: Palpitations, tachycardia, and PSVT: Episode that occurred 2 days ago was most suspicious for SVT, though EKG is more suggestive of sinus tachycardia.  It is unclear why a large meal would precipitate such profound sinus tachycardia.  EKG today does not show any significant abnormalities.  I have recommended checking labs today including a CBC, CMP, magnesium level, and TSH.  We will also double nebivolol to 10 mg daily.  If episodes recur, we will need to consider repeating ambulatory cardiac monitoring.  As large meals seem to trigger these episodes, I have encouraged Ms. Bilotta to eat smaller portions.  Chest pain: Mr. 01/14/2020 reports some chest discomfort with this weeks tachycardia but otherwise had been without angina.  Coronary CTA last year was without evidence of CAD.  No further ischemia evaluation recommended at this time.  Hypertension: Blood pressure mildly elevated today.  We will increase  nebivolol to 10 mg daily.  Return to clinic in 1 month.  Follow-up:  Gearlean Alf, MD 02/22/2022 3:59 PM

## 2022-02-22 NOTE — Telephone Encounter (Signed)
Spoke to [facility] advised Dr.End increased Bystolic to 10 mg daily.

## 2022-02-22 NOTE — Patient Instructions (Signed)
Medication Instructions:   Your physician has recommended you make the following change in your medication:   INCREASE Bystolic 10 mg daily - You may take 2 tablets of current dose (5 mg tablet) until you pick up new Rx  *If you need a refill on your cardiac medications before your next appointment, please call your pharmacy*   Lab Work:  Today at the medical mall:  CBC, CMET, Magnesium, TSH  -  Please go to the Medical Mall Entrance at [facility] -  Check in at the Registration Desk: 1st desk to the right, past the screening table   If you have labs (blood work) drawn today and your tests are completely normal, you will receive your results only by: MyChart Message (if you have MyChart) OR A paper copy in the mail If you have any lab test that is abnormal or we need to change your treatment, we will call you to review the results.   Testing/Procedures:  None ordered   Follow-Up: At [facility], you and your health needs are our priority.  As part of our continuing mission to provide you with exceptional heart care, we have created designated Provider Care Teams.  These Care Teams include your primary Cardiologist (physician) and Advanced Practice Providers (APPs -  Physician Assistants and Nurse Practitioners) who all work together to provide you with the care you need, when you need it.  We recommend signing up for the patient portal called "MyChart".  Sign up information is provided on this After Visit Summary.  MyChart is used to connect with patients for Virtual Visits (Telemedicine).  Patients are able to view lab/test results, encounter notes, upcoming appointments, etc.  Non-urgent messages can be sent to your provider as well.   To learn more about what you can do with MyChart, go to [email or web address].    Your next appointment:   1 month(s)  The format for your next appointment:   In Person  Provider:   You may see Yvonne Kendall, MD or one of the  following Advanced Practice Providers on your designated Care Team:   Nicolasa Ducking, NP Eula Listen, PA-C Cadence Fransico Michael, PA-C Charlsie Quest, NP    Important Information About Sugar

## 2022-02-22 NOTE — Telephone Encounter (Signed)
Pt c/o medication issue:  1. Name of Medication: nebivolol (BYSTOLIC) 10 MG tablet  2. How are you currently taking this medication (dosage and times per day)?   3. Are you having a reaction (difficulty breathing--STAT)?   4. What is your medication issue? Pharmacy called asking for clarification on instructions for this medication

## 2022-02-23 ENCOUNTER — Encounter: Payer: Self-pay | Admitting: Internal Medicine

## 2022-02-24 DIAGNOSIS — I480 Paroxysmal atrial fibrillation: Secondary | ICD-10-CM

## 2022-02-24 HISTORY — DX: Paroxysmal atrial fibrillation: I48.0

## 2022-03-03 ENCOUNTER — Encounter (HOSPITAL_COMMUNITY): Payer: Self-pay

## 2022-03-03 ENCOUNTER — Other Ambulatory Visit: Payer: Self-pay

## 2022-03-03 ENCOUNTER — Observation Stay (HOSPITAL_COMMUNITY)
Admission: EM | Admit: 2022-03-03 | Discharge: 2022-03-04 | Disposition: A | Discharge status: Home / Self Care | Payer: Medicare Other | Attending: Internal Medicine | Admitting: Internal Medicine

## 2022-03-03 ENCOUNTER — Emergency Department (HOSPITAL_COMMUNITY): Payer: Medicare Other

## 2022-03-03 DIAGNOSIS — I5032 Chronic diastolic (congestive) heart failure: Secondary | ICD-10-CM | POA: Diagnosis not present

## 2022-03-03 DIAGNOSIS — I4891 Unspecified atrial fibrillation: Secondary | ICD-10-CM | POA: Diagnosis not present

## 2022-03-03 DIAGNOSIS — I110 Hypertensive heart disease with heart failure: Secondary | ICD-10-CM | POA: Diagnosis not present

## 2022-03-03 DIAGNOSIS — E785 Hyperlipidemia, unspecified: Secondary | ICD-10-CM | POA: Insufficient documentation

## 2022-03-03 DIAGNOSIS — Z79899 Other long term (current) drug therapy: Secondary | ICD-10-CM | POA: Insufficient documentation

## 2022-03-03 DIAGNOSIS — E669 Obesity, unspecified: Secondary | ICD-10-CM | POA: Insufficient documentation

## 2022-03-03 DIAGNOSIS — Z7982 Long term (current) use of aspirin: Secondary | ICD-10-CM | POA: Insufficient documentation

## 2022-03-03 DIAGNOSIS — Z6834 Body mass index (BMI) 34.0-34.9, adult: Secondary | ICD-10-CM | POA: Diagnosis not present

## 2022-03-03 DIAGNOSIS — Z8616 Personal history of COVID-19: Secondary | ICD-10-CM | POA: Diagnosis not present

## 2022-03-03 DIAGNOSIS — I480 Paroxysmal atrial fibrillation: Secondary | ICD-10-CM

## 2022-03-03 DIAGNOSIS — R079 Chest pain, unspecified: Secondary | ICD-10-CM

## 2022-03-03 DIAGNOSIS — R072 Precordial pain: Secondary | ICD-10-CM | POA: Diagnosis present

## 2022-03-03 DIAGNOSIS — I10 Essential (primary) hypertension: Secondary | ICD-10-CM | POA: Diagnosis present

## 2022-03-03 DIAGNOSIS — R0789 Other chest pain: Secondary | ICD-10-CM | POA: Diagnosis present

## 2022-03-03 HISTORY — DX: Chronic diastolic (congestive) heart failure: I50.32

## 2022-03-03 LAB — CBC WITH DIFFERENTIAL/PLATELET
Abs Immature Granulocytes: 0.08 10*3/uL — ABNORMAL HIGH (ref 0.00–0.07)
Basophils Absolute: 0.1 10*3/uL (ref 0.0–0.1)
Basophils Relative: 1 %
Eosinophils Absolute: 0.1 10*3/uL (ref 0.0–0.5)
Eosinophils Relative: 1 %
HCT: 45.3 % (ref 36.0–46.0)
Hemoglobin: 14.9 g/dL (ref 12.0–15.0)
Immature Granulocytes: 1 %
Lymphocytes Relative: 24 %
Lymphs Abs: 2.3 10*3/uL (ref 0.7–4.0)
MCH: 33 pg (ref 26.0–34.0)
MCHC: 32.9 g/dL (ref 30.0–36.0)
MCV: 100.4 fL — ABNORMAL HIGH (ref 80.0–100.0)
Monocytes Absolute: 1.1 10*3/uL — ABNORMAL HIGH (ref 0.1–1.0)
Monocytes Relative: 12 %
Neutro Abs: 6 10*3/uL (ref 1.7–7.7)
Neutrophils Relative %: 61 %
Platelets: 200 10*3/uL (ref 150–400)
RBC: 4.51 MIL/uL (ref 3.87–5.11)
RDW: 12.3 % (ref 11.5–15.5)
WBC: 9.7 10*3/uL (ref 4.0–10.5)
nRBC: 0 % (ref 0.0–0.2)

## 2022-03-03 LAB — BASIC METABOLIC PANEL
Anion gap: 11 (ref 5–15)
BUN: 18 mg/dL (ref 8–23)
CO2: 23 mmol/L (ref 22–32)
Calcium: 9.4 mg/dL (ref 8.9–10.3)
Chloride: 106 mmol/L (ref 98–111)
Creatinine, Ser: 1.06 mg/dL — ABNORMAL HIGH (ref 0.44–1.00)
GFR, Estimated: 57 mL/min — ABNORMAL LOW (ref >60)
Glucose, Bld: 109 mg/dL — ABNORMAL HIGH (ref 70–99)
Potassium: 3.6 mmol/L (ref 3.5–5.1)
Sodium: 140 mmol/L (ref 135–145)

## 2022-03-03 LAB — TROPONIN I (HIGH SENSITIVITY): Troponin I (High Sensitivity): 4 ng/L (ref <18)

## 2022-03-03 MED ORDER — HEPARIN (PORCINE) 25000 UT/250ML-% IV SOLN: 1100.0000 [IU]/h | INTRAVENOUS | Status: DC

## 2022-03-03 MED ORDER — DILTIAZEM HCL-DEXTROSE 125-5 MG/125ML-% IV SOLN (PREMIX): 5.0000 mg/h | INTRAVENOUS | Status: DC

## 2022-03-03 MED ORDER — HEPARIN (PORCINE) 25000 UT/250ML-% IV SOLN: 12.0000 [IU]/kg/h | INTRAVENOUS | Status: DC

## 2022-03-03 MED ORDER — HEPARIN BOLUS VIA INFUSION
4000.0000 [IU] | Freq: Once | INTRAVENOUS | Status: DC
  Filled 2022-03-03: qty 4000

## 2022-03-03 MED ORDER — DILTIAZEM LOAD VIA INFUSION
10.0000 mg | Freq: Once | INTRAVENOUS | Status: DC
  Filled 2022-03-03: qty 10

## 2022-03-03 NOTE — ED Triage Notes (Signed)
Pt BIB EMS from home c/o CP since 2115 tonight. Describes it as pressure in center of chest and pressure of back, 3 out of 10 pain. Pt took 325 ASA before EMS arrival. Pt had a previous event Monday night, per her cardiologist was told to come in. AO4

## 2022-03-03 NOTE — Progress Notes (Signed)
ANTICOAGULATION CONSULT NOTE - Initial Consult  Pharmacy Consult for Heparin Indication: chest pain/ACS  Allergies  Allergen Reactions   Doxycycline     Other reaction(s): Other (see comments) Makes her feels sick Makes her feels sick    Lidocaine Anaphylaxis   Rocephin [Ceftriaxone] Anaphylaxis   Fentanyl     Hypotension    Ibuprofen     tachycardia   Meloxicam     tachycardia   Morphine And Related     Hypotension    Sulfamethoxazole-Trimethoprim Diarrhea and Nausea And Vomiting        Valsartan Other (See Comments)    Fatigue - "wiped out"   Other Rash and Hypertension    seafood    Patient Measurements: Height: 5\' 4"  (162.6 cm) Weight: 91.2 kg (201 lb) IBW/kg (Calculated) : 54.7 Heparin Dosing Weight: 75 kg  Vital Signs: Temp: 98.1 F (36.7 C) (09/08 2256) Temp Source: Oral (09/08 2256) BP: 156/97 (09/08 2256) Pulse Rate: 150 (09/08 2256)  Labs: Recent Labs    03/03/22 2312  HGB 14.9  HCT 45.3  PLT 200    Estimated Creatinine Clearance: 66.2 mL/min (by C-G formula based on SCr of 0.89 mg/dL).   Medical History: Past Medical History:  Diagnosis Date   Acute cystitis with hematuria 12/13/2018   Arthralgia of multiple sites 08/28/2018   COVID-19 12/2019   Essential hypertension 09/03/2018   Febrile illness 08/28/2018   Hematuria 08/28/2018   Hypertension    Long-term use of aspirin therapy 08/06/2018   Palpitations 09/03/2018   Pericarditis 07/20/2018   PSVT (paroxysmal supraventricular tachycardia) (HCC) 09/03/2018   Rectal bleeding 08/28/2018   Situational anxiety 08/28/2018   Syncope 07/20/2018    Medications:  No current facility-administered medications on file prior to encounter.   Current Outpatient Medications on File Prior to Encounter  Medication Sig Dispense Refill   aspirin EC 81 MG tablet Take 81 mg by mouth daily. Swallow whole.     nebivolol (BYSTOLIC) 10 MG tablet Take 1 tablet (10 mg total) by mouth daily. 30 tablet 6      Assessment: 66 y.o. female with chest pain for heparin  Goal of Therapy:  Heparin level 0.3-0.7 units/ml Monitor platelets by anticoagulation protocol: Yes   Plan:  Heparin 4000 units IV bolus, then start heparin 1100 units/hr Check heparin level in 6 hours.   73 03/03/2022,11:29 PM

## 2022-03-03 NOTE — ED Provider Notes (Signed)
[facility] EMERGENCY DEPARTMENT Provider Note   CSN: [id number] Arrival date & time: 03/03/22  2244     History {Add pertinent medical, surgical, social history, OB history to HPI:1} Chief Complaint  Patient presents with   Chest Pain    Erica Oneill is a 66 y.o. female.   Chest Pain Pain location:  Substernal area Pain quality: pressure   Radiates to: back. Pain severity:  Moderate Onset quality:  Sudden Timing:  Constant Progression:  Unchanged Chronicity:  Recurrent Context: at rest   Relieved by:  Nothing Worsened by:  Nothing Ineffective treatments:  None tried Associated symptoms: palpitations   Associated symptoms: no fever   Risk factors: not female       Past Medical History:  Diagnosis Date   Acute cystitis with hematuria 12/13/2018   Arthralgia of multiple sites 08/28/2018   COVID-19 12/2019   Essential hypertension 09/03/2018   Febrile illness 08/28/2018   Hematuria 08/28/2018   Hypertension    Long-term use of aspirin therapy 08/06/2018   Palpitations 09/03/2018   Pericarditis 07/20/2018   PSVT (paroxysmal supraventricular tachycardia) (HCC) 09/03/2018   Rectal bleeding 08/28/2018   Situational anxiety 08/28/2018   Syncope 07/20/2018    Home Medications Prior to Admission medications   Medication Sig Start Date End Date Taking? Authorizing Provider  aspirin EC 81 MG tablet Take 81 mg by mouth daily. Swallow whole.    [provider]  nebivolol (BYSTOLIC) 10 MG tablet Take 1 tablet (10 mg total) by mouth daily. 02/22/22   End, Cristal Deer, MD      Allergies    Doxycycline, Lidocaine, Rocephin [ceftriaxone], Fentanyl, Ibuprofen, Meloxicam, Morphine and related, Sulfamethoxazole-trimethoprim, Valsartan, and Other    Review of Systems   Review of Systems  Constitutional:  Negative for fever.  HENT:  Negative for facial swelling.   Eyes:  Negative for redness.  Cardiovascular:  Positive for chest pain and palpitations.   Neurological:  Negative for facial asymmetry.  All other systems reviewed and are negative.   Physical Exam Updated Vital Signs BP (!) 156/97 (BP Location: Left Arm)   Pulse (!) 150   Temp 98.1 F (36.7 C) (Oral)   Resp (!) 23   Ht 5\' 4"  (1.626 m)   Wt 91.2 kg   SpO2 97%   BMI 34.50 kg/m  Physical Exam Vitals and nursing note reviewed. Exam conducted with a chaperone present.  Constitutional:      General: She is not in acute distress.    Appearance: She is well-developed.  HENT:     Head: Normocephalic and atraumatic.     Nose: Nose normal.  Eyes:     Pupils: Pupils are equal, round, and reactive to light.  Cardiovascular:     Rate and Rhythm: Tachycardia present. Rhythm irregular.     Pulses: Normal pulses.     Heart sounds: Normal heart sounds.  Pulmonary:     Effort: Pulmonary effort is normal. No respiratory distress.     Breath sounds: Normal breath sounds.  Abdominal:     General: Bowel sounds are normal. There is no distension.     Palpations: Abdomen is soft.     Tenderness: There is no abdominal tenderness. There is no guarding or rebound.  Genitourinary:    Vagina: No vaginal discharge.  Musculoskeletal:        General: Normal range of motion.     Cervical back: Neck supple.  Skin:    General: Skin is warm and  dry.     Capillary Refill: Capillary refill takes less than 2 seconds.     Findings: No erythema or rash.  Neurological:     General: No focal deficit present.     Mental Status: She is alert.     Deep Tendon Reflexes: Reflexes normal.  Psychiatric:        Mood and Affect: Mood normal.     ED Results / Procedures / Treatments   Labs (all labs ordered are listed, but only abnormal results are displayed) Results for orders placed or performed during the hospital encounter of 03/03/22  CBC with Differential  Result Value Ref Range   WBC 9.7 4.0 - 10.5 K/uL   RBC 4.51 3.87 - 5.11 MIL/uL   Hemoglobin 14.9 12.0 - 15.0 g/dL   HCT 45.3 36.0 -  46.0 %   MCV 100.4 (H) 80.0 - 100.0 fL   MCH 33.0 26.0 - 34.0 pg   MCHC 32.9 30.0 - 36.0 g/dL   RDW 12.3 11.5 - 15.5 %   Platelets 200 150 - 400 K/uL   nRBC 0.0 0.0 - 0.2 %   Neutrophils Relative % 61 %   Neutro Abs 6.0 1.7 - 7.7 K/uL   Lymphocytes Relative 24 %   Lymphs Abs 2.3 0.7 - 4.0 K/uL   Monocytes Relative 12 %   Monocytes Absolute 1.1 (H) 0.1 - 1.0 K/uL   Eosinophils Relative 1 %   Eosinophils Absolute 0.1 0.0 - 0.5 K/uL   Basophils Relative 1 %   Basophils Absolute 0.1 0.0 - 0.1 K/uL   Immature Granulocytes 1 %   Abs Immature Granulocytes 0.08 (H) 0.00 - 0.07 K/uL   DG Chest Portable 1 View  Result Date: 03/03/2022 CLINICAL DATA:  Chest pain EXAM: PORTABLE CHEST 1 VIEW COMPARISON:  07/14/2019 FINDINGS: Lungs are clear.  No pleural effusion or pneumothorax. The heart is normal in size. IMPRESSION: No evidence of acute cardiopulmonary disease. Electronically Signed   By: Julian Hy M.D.   On: 03/03/2022 23:28     EKG EKG Interpretation  Date/Time:  Friday March 03 2022 22:55:36 EDT Ventricular Rate:  151 PR Interval:  111 QRS Duration: 91 QT Interval:  279 QTC Calculation: 443 R Axis:   -17 Text Interpretation: Atrial fibrillation Lateral leads are also involved Confirmed by Randal Buba, Shyane Fossum ([id number]) on 03/03/2022 11:07:35 PM  Radiology DG Chest Portable 1 View  Result Date: 03/03/2022 CLINICAL DATA:  Chest pain EXAM: PORTABLE CHEST 1 VIEW COMPARISON:  07/14/2019 FINDINGS: Lungs are clear.  No pleural effusion or pneumothorax. The heart is normal in size. IMPRESSION: No evidence of acute cardiopulmonary disease. Electronically Signed   By: Julian Hy M.D.   On: 03/03/2022 23:28    Procedures Procedures  {Document cardiac monitor, telemetry assessment procedure when appropriate:1}  Medications Ordered in ED Medications  diltiazem (CARDIZEM) 1 mg/mL load via infusion 10 mg (has no administration in time range)    And  diltiazem (CARDIZEM) 125 mg in  dextrose 5% 125 mL (1 mg/mL) infusion (has no administration in time range)  heparin bolus via infusion 4,000 Units (has no administration in time range)  heparin ADULT infusion 100 units/mL (25000 units/221mL) (has no administration in time range)    ED Course/ Medical Decision Making/ A&P                           Medical Decision Making Patient with 2 episodes of tachycardia this week.  Only on a baby ASA.  Told to come in by cardiology.    Amount and/or Complexity of Data Reviewed External Data Reviewed: notes.    Details: Previous cardiology notes reviewed  Labs: ordered.    Details: All labs reviewed:  normal white count 9.7, normal hemoglobin 14.9, platelets 200k   Radiology: ordered.  Risk Prescription drug management. Decision regarding hospitalization.   ***  {Document critical care time when appropriate:1} {Document review of labs and clinical decision tools ie heart score, Chads2Vasc2 etc:1}  {Document your independent review of radiology images, and any outside records:1} {Document your discussion with family members, caretakers, and with consultants:1} {Document social determinants of health affecting pt's care:1} {Document your decision making why or why not admission, treatments were needed:1} Final Clinical Impression(s) / ED Diagnoses Final diagnoses:  Chest pain, unspecified type    Rx / DC Orders ED Discharge Orders     None

## 2022-03-04 ENCOUNTER — Encounter (HOSPITAL_COMMUNITY): Payer: Self-pay | Admitting: Internal Medicine

## 2022-03-04 ENCOUNTER — Observation Stay (HOSPITAL_BASED_OUTPATIENT_CLINIC_OR_DEPARTMENT_OTHER): Payer: Medicare Other

## 2022-03-04 ENCOUNTER — Other Ambulatory Visit (HOSPITAL_COMMUNITY): Payer: Medicare Other

## 2022-03-04 DIAGNOSIS — R0789 Other chest pain: Secondary | ICD-10-CM

## 2022-03-04 DIAGNOSIS — I371 Nonrheumatic pulmonary valve insufficiency: Secondary | ICD-10-CM

## 2022-03-04 DIAGNOSIS — I10 Essential (primary) hypertension: Secondary | ICD-10-CM

## 2022-03-04 DIAGNOSIS — I4891 Unspecified atrial fibrillation: Secondary | ICD-10-CM | POA: Diagnosis not present

## 2022-03-04 DIAGNOSIS — I5032 Chronic diastolic (congestive) heart failure: Secondary | ICD-10-CM | POA: Diagnosis not present

## 2022-03-04 LAB — URINALYSIS, COMPLETE (UACMP) WITH MICROSCOPIC
Bilirubin Urine: NEGATIVE
Glucose, UA: NEGATIVE mg/dL
Hgb urine dipstick: NEGATIVE
Ketones, ur: 5 mg/dL — AB
Nitrite: NEGATIVE
Protein, ur: NEGATIVE mg/dL
Specific Gravity, Urine: 1.003 — ABNORMAL LOW (ref 1.005–1.030)
pH: 6 (ref 5.0–8.0)

## 2022-03-04 LAB — RAPID URINE DRUG SCREEN, HOSP PERFORMED
Amphetamines: NOT DETECTED
Barbiturates: NOT DETECTED
Benzodiazepines: NOT DETECTED
Cocaine: NOT DETECTED
Opiates: NOT DETECTED
Tetrahydrocannabinol: NOT DETECTED

## 2022-03-04 LAB — ECHOCARDIOGRAM COMPLETE
AV Mean grad: 4 mmHg
AV Peak grad: 8.6 mmHg
Ao pk vel: 1.47 m/s
Area-P 1/2: 4.04 cm2
Height: 64 in
S' Lateral: 2.8 cm
Weight: 3216 oz

## 2022-03-04 LAB — COMPREHENSIVE METABOLIC PANEL
ALT: 44 U/L (ref 0–44)
AST: 30 U/L (ref 15–41)
Albumin: 3.5 g/dL (ref 3.5–5.0)
Alkaline Phosphatase: 47 U/L (ref 38–126)
Anion gap: 10 (ref 5–15)
BUN: 16 mg/dL (ref 8–23)
CO2: 22 mmol/L (ref 22–32)
Calcium: 9.3 mg/dL (ref 8.9–10.3)
Chloride: 109 mmol/L (ref 98–111)
Creatinine, Ser: 0.82 mg/dL (ref 0.44–1.00)
GFR, Estimated: >60 mL/min (ref >60)
Glucose, Bld: 92 mg/dL (ref 70–99)
Potassium: 3.9 mmol/L (ref 3.5–5.1)
Sodium: 141 mmol/L (ref 135–145)
Total Bilirubin: 0.9 mg/dL (ref 0.3–1.2)
Total Protein: 6.6 g/dL (ref 6.5–8.1)

## 2022-03-04 LAB — LIPID PANEL
Cholesterol: 195 mg/dL (ref 0–200)
HDL: 42 mg/dL (ref >40)
LDL Cholesterol: 146 mg/dL — ABNORMAL HIGH (ref 0–99)
Total CHOL/HDL Ratio: 4.6 RATIO
Triglycerides: 36 mg/dL (ref <150)
VLDL: 7 mg/dL (ref 0–40)

## 2022-03-04 LAB — MAGNESIUM
Magnesium: 2 mg/dL (ref 1.7–2.4)
Magnesium: 2.1 mg/dL (ref 1.7–2.4)

## 2022-03-04 LAB — HEMOGLOBIN A1C
Hgb A1c MFr Bld: 5 % (ref 4.8–5.6)
Mean Plasma Glucose: 96.8 mg/dL

## 2022-03-04 LAB — VITAMIN B12: Vitamin B-12: 288 pg/mL (ref 180–914)

## 2022-03-04 LAB — TROPONIN I (HIGH SENSITIVITY)
Troponin I (High Sensitivity): 6 ng/L (ref <18)
Troponin I (High Sensitivity): 9 ng/L (ref <18)

## 2022-03-04 LAB — BRAIN NATRIURETIC PEPTIDE: B Natriuretic Peptide: 49.9 pg/mL (ref 0.0–100.0)

## 2022-03-04 MED ORDER — APIXABAN 5 MG PO TABS
5.0000 mg | ORAL_TABLET | Freq: Two times a day (BID) | ORAL | Status: DC
  Administered 2022-03-04 (×2): 5 mg via ORAL
  Filled 2022-03-04 (×2): qty 1

## 2022-03-04 MED ORDER — ACETAMINOPHEN 325 MG PO TABS: 650.0000 mg | ORAL_TABLET | Freq: Four times a day (QID) | ORAL | Status: DC | PRN

## 2022-03-04 MED ORDER — ACETAMINOPHEN 650 MG RE SUPP: 650.0000 mg | Freq: Four times a day (QID) | RECTAL | Status: DC | PRN

## 2022-03-04 MED ORDER — METOPROLOL TARTRATE 25 MG PO TABS: 12.5000 mg | ORAL_TABLET | Freq: Two times a day (BID) | ORAL | 2 refills | Status: DC

## 2022-03-04 MED ORDER — APIXABAN 5 MG PO TABS: 5.0000 mg | ORAL_TABLET | Freq: Two times a day (BID) | ORAL | 2 refills | Status: DC

## 2022-03-04 MED ORDER — POTASSIUM CHLORIDE CRYS ER 20 MEQ PO TBCR
40.0000 meq | EXTENDED_RELEASE_TABLET | Freq: Once | ORAL | Status: AC
  Administered 2022-03-04: 40 meq via ORAL
  Filled 2022-03-04: qty 2

## 2022-03-04 MED ORDER — ROSUVASTATIN CALCIUM 10 MG PO TABS: 10.0000 mg | ORAL_TABLET | Freq: Every day | ORAL | 11 refills | Status: DC

## 2022-03-04 MED ORDER — NEBIVOLOL HCL 10 MG PO TABS
10.0000 mg | ORAL_TABLET | Freq: Every day | ORAL | Status: DC
  Administered 2022-03-04: 10 mg via ORAL
  Filled 2022-03-04 (×2): qty 1

## 2022-03-04 MED ORDER — METOPROLOL TARTRATE 12.5 MG HALF TABLET
12.5000 mg | ORAL_TABLET | Freq: Two times a day (BID) | ORAL | Status: DC
Start: 2022-03-04 — End: 2022-03-04

## 2022-03-04 NOTE — Progress Notes (Signed)
ANTICOAGULATION CONSULT NOTE - Initial Consult  Pharmacy Consult for apixaban Indication: atrial fibrillation  Allergies  Allergen Reactions   Doxycycline     Other reaction(s): Other (see comments) Makes her feels sick Makes her feels sick    Lidocaine Anaphylaxis   Rocephin [Ceftriaxone] Anaphylaxis   Fentanyl     Hypotension    Ibuprofen     tachycardia   Meloxicam     tachycardia   Morphine And Related     Hypotension    Sulfamethoxazole-Trimethoprim Diarrhea and Nausea And Vomiting        Valsartan Other (See Comments)    Fatigue - "wiped out"   Other Rash and Hypertension    seafood    Patient Measurements: Height: 5\' 4"  (162.6 cm) Weight: 91.2 kg (201 lb) IBW/kg (Calculated) : 54.7  Vital Signs: Temp: 98.1 F (36.7 C) (09/08 2256) Temp Source: Oral (09/08 2256) BP: 118/66 (09/09 0000) Pulse Rate: 84 (09/09 0000)  Labs: Recent Labs    03/03/22 2312  HGB 14.9  HCT 45.3  PLT 200  CREATININE 1.06*  TROPONINIHS 4     Estimated Creatinine Clearance: 55.6 mL/min (A) (by C-G formula based on SCr of 1.06 mg/dL (H)).   Medical History: Past Medical History:  Diagnosis Date   Acute cystitis with hematuria 12/13/2018   Arthralgia of multiple sites 08/28/2018   COVID-19 12/2019   Essential hypertension 09/03/2018   Febrile illness 08/28/2018   Hematuria 08/28/2018   Hypertension    Long-term use of aspirin therapy 08/06/2018   Palpitations 09/03/2018   Pericarditis 07/20/2018   PSVT (paroxysmal supraventricular tachycardia) (HCC) 09/03/2018   Rectal bleeding 08/28/2018   Situational anxiety 08/28/2018   Syncope 07/20/2018    Medications:  No current facility-administered medications on file prior to encounter.   Current Outpatient Medications on File Prior to Encounter  Medication Sig Dispense Refill   aspirin EC 81 MG tablet Take 81 mg by mouth daily. Swallow whole.     nebivolol (BYSTOLIC) 10 MG tablet Take 1 tablet (10 mg total) by mouth daily. 30  tablet 6     Assessment: 66 y.o. female with AF  Goal of Therapy:  Monitor platelets by anticoagulation protocol: Yes   Plan:  Start apixaban 5 mg po bid Follow-up with cards recs in AM Pharmacy will sign off consult but continue to monitor making recommendations prn  71 BS, PharmD, BCPS Clinical Pharmacist 03/04/2022 12:19 AM  Contact: [phone] after 3 PM  "Be curious, not judgmental..." -[phone]

## 2022-03-04 NOTE — ED Notes (Signed)
Patient A&Ox4 with no complaints of chest pain.

## 2022-03-04 NOTE — TOC CM/SW Note (Signed)
Provided 30-day free Eliquis pharmacy card to Mrs. Midgley prior to discharge.   Raiford Noble, MSN, RN,BSN Inpatient [facility] Case Manager [phone]

## 2022-03-04 NOTE — Discharge Summary (Signed)
Physician Discharge Summary  Erica Oneill SNK:[id number] DOB: February 20, 1956 DOA: 03/03/2022  PCP: Hadley Pen, MD  Admit date: 03/03/2022 Discharge date: 03/04/2022  Admitted From: Home Disposition: Home   Recommendations for Outpatient Follow-up:  Follow up with PCP in 1-2 weeks Please obtain BMP/CBC in one week Please follow up with cardiology Dr. end  Home Health: None Equipment/Devices: None Discharge Condition: Stable CODE STATUS: Full code Diet recommendation: Heart healthy Brief/Interim Summary:  Erica Oneill is a 66 y.o. female with medical history significant for paroxysmal SVT, essential hypertension, chronic diastolic heart failure, who is admitted to [facility] on 03/03/2022 with new diagnosis of atrial fibrillation with RVR after presenting from home to [facility] ED complaining of chest pain.    Patient reports an episode of substernal chest pressure, without radiation, starting around 2100 on 03/03/2022 after she had laid down.  Pain is constant for greater than 90 minutes and was nonexertional, nonpleuritic, not reproducible with direct palpation of the anterior chest wall.  Did not receive any sublingual nitroglycerin.  Was associated with new onset palpitations as well as increased heart rate.  Denies any associated significant shortness of breath, dizziness, or any associated presyncope or syncope.  Denies any associated nausea/vomiting.  Chest pain remained constant until she experienced spontaneous conversion of atrial fibrillation to sinus rhythm in [facility] ED this evening.  She notes ensuing complete resolution of her chest pain within a few seconds of conversion to sinus rhythm, and denies any ensuing recurrence of her chest pain.   She had experienced a similar episode of substernal chest pressure associated with palpitations and elevated heart rate on Monday, 02/27/2022, although the intensity of the symptoms were not as severe as that experienced at the episode  earlier this evening, and the duration of the symptoms 5 days ago was noted to be less, with the episode resolving spontaneously after about 40 minutes.    No recent orthopnea, PND, or new onset peripheral edema.  No recent cough, wheezing, mopped assist, new lower extremity erythema, or calf tenderness.  No recent trauma or travel.  Denies any recent subjective fever, chills, rigors, or generalized myalgias.  No recent dysuria, gross hematuria, abdominal pain, melena, hematochezia.  Denies any regular recurrent or recent alcohol consumption, no history of recreational drug use.  She is a lifelong non-smoker.    Per chart review, she underwent prolonged heart monitoring as an outpatient in May 2022, during which time she wore Holter monitor for greater than 12 days, during which she was noted to exhibit rare PACs, rare PVCs, and 2 episodes of paroxysmal SVT lasting up to 13 seconds, in the absence of any documentation of atrial fibrillation.  Per my discussions with her, patient confirms no known prior atrial fibrillation history.   In May 2022, she also underwent cardiac CT scan, which was associated with a calcium score of 0.   Per chart review, most recent echocardiogram occurred in August 2021, at which time she reports she was experiencing acute pericarditis.  This echocardiogram is notable for LVEF 60 to 65%, no focal wall motion abnormalities, grade 1 diastolic dysfunction, normal right ventricular systolic function, normal size of bilateral atria, and trivial mitral regurgitation.    In the setting of essential pretension, she is on nebivolol as an outpatient.  After the episode of chest pain, palpitations, and elevated heart rate on Monday, 02/27/2022, she was instructed by her outpatient cardiologist to increase the dose of nebivolol from 5 to 10 mg,  which she is compliantly done.  She notes that she was on metoprolol several years ago, but that this medication was discontinued as she felt fatigued  while taking this beta-blocker.   She takes a daily baby aspirin, but otherwise no additional blood thinners, including no formal anticoagulation.  No known history of gastrointestinal bleed.   Per chart review, TSH was checked as an outpatient on 02/22/2022, and found to be 3.122.       ED Course:  Vital signs in the ED were notable for the following: Afebrile; initial heart rate 1 49-1 51, following which she spontaneously converted to sinus rhythm, with ensuing heart rates in the 80s; blood pressure during her atrial fibrillation with RVR noted to be 141/98, with ensuing blood pressure following conversion to sinus rhythm now noted to be 123/69; respiratory rate 16-23, oxygen saturation 96 to 97% on room air.   Labs were notable for the following: BMP notable for the following: Sodium 140, potassium 3.6, bicarbonate 23, creatinine 1.06.  High-sensitivity troponin I initially noted to be 4, with repeat value trending up slightly to 6.  CBC notable for white blood cell count 9700, hemoglobin 14.9, platelet count 200.   Imaging and additional notable ED work-up: Presenting EKG showed atrial fibrillation with RVR, ventricular rate 151, and no evidence of T wave or ST changes.  Following spontaneous conversion to sinus rhythm, updated EKG was obtained and demonstrated sinus rhythm with heart rate 90, normal intervals, no evidence of T wave or ST changes.  Chest x-ray showed no evidence of acute cardiopulmonary process.   EDP discussed the patient's case with the on-call cardiology fellow, Dr. Welton Flakes, Who confirmed atrial fibrillation on initial EKG and recommended admission to the hospitalist service for further evaluation and management of new diagnosis of atrial fibrillation with RVR.  Additionally, Dr. Lennette Bihari recommended initiation of formal anticoagulation on DOAC for thromboembolic prophylaxis as well as pursuit of echocardiogram in the morning.   While in the ED, the following were administered:  Eliquis 5 mg p.o. x1.  Of note, diltiazem drip was initiated briefly before the patient experienced continuous conversion to sinus rhythm, following which diltiazem drip was discontinued.    Subsequently, the patient was admitted for observation for further evaluation management of new diagnosis of atrial fibrillation with RVR after presenting with atypical chest pain that completely resolved concomitant with spontaneous conversion to sinus rhythm.     Discharge Diagnoses:  Principal Problem:   Atrial fibrillation with RVR (HCC) Active Problems:   Essential hypertension   Atypical chest pain   Chronic diastolic CHF (congestive heart failure) (HCC)    #1 A-fib RVR resolved and converted to sinus rhythm patient was treated with Bystolic and Cardizem dose was initiated in the ER.  Patient reported that Bystolic never really helped her heart rate the dose was recently increased to 10 mg daily for SVT.  Discussed with the on-call cardiologist and changed to metoprolol 12.5 mg twice a day.  She has a history of taking metoprolol 12 years ago and she was feeling very tired and weak after taking that however she wanted to give it a try again. TSH was normal EKG showed no acute changes Troponin was negative Echocardiogram showed normal ejection fraction no valvular abnormalities She was discharged on Eliquis and metoprolol. Patient ambulated in the hallway by herself prior to discharge without any chest pain shortness of breath or palpitations.  She was anxious to go home.  She will follow-up with her primary cardiologist  on discharge.  #2 hyperlipidemia with LDL above 145 No prior history per patient Added Crestor 10 mg daily  #3 hypertension continue metoprolol  #4 obesity BMI 34.50, gait is overall prognosis.  Discussed diet and exercise with the patient.  She is eager and anxious to proceed with diet and exercise.   Estimated body mass index is 34.5 kg/m as calculated from the following:    Height as of this encounter: 5\' 4"  (1.626 m).   Weight as of this encounter: 91.2 kg.  Discharge Instructions  Discharge Instructions     Diet - low sodium heart healthy   Complete by: As directed    Diet - low sodium heart healthy   Complete by: As directed    Increase activity slowly   Complete by: As directed    Increase activity slowly   Complete by: As directed       Allergies as of 03/04/2022       Reactions   Doxycycline    Other reaction(s): Other (see comments) Makes her feels sick Makes her feels sick   Lidocaine Anaphylaxis   Rocephin [ceftriaxone] Anaphylaxis   Fentanyl    Hypotension   Ibuprofen    tachycardia   Meloxicam    tachycardia   Morphine And Related    Hypotension   Sulfamethoxazole-trimethoprim Diarrhea, Nausea And Vomiting       Valsartan Other (See Comments)   Fatigue - "wiped out"   Other Rash, Hypertension   seafood        Medication List     STOP taking these medications    amoxicillin-clavulanate 875-125 MG tablet Commonly known as: AUGMENTIN   nebivolol 10 MG tablet Commonly known as: Bystolic       TAKE these medications    acetaminophen 500 MG tablet Commonly known as: TYLENOL Take 500 mg by mouth every 6 (six) hours as needed for moderate pain.   apixaban 5 MG Tabs tablet Commonly known as: ELIQUIS Take 1 tablet (5 mg total) by mouth 2 (two) times daily.   metoprolol tartrate 25 MG tablet Commonly known as: LOPRESSOR Take 0.5 tablets (12.5 mg total) by mouth 2 (two) times daily.   rosuvastatin 10 MG tablet Commonly known as: Crestor Take 1 tablet (10 mg total) by mouth daily.        Allergies  Allergen Reactions   Doxycycline     Other reaction(s): Other (see comments) Makes her feels sick Makes her feels sick    Lidocaine Anaphylaxis   Rocephin [Ceftriaxone] Anaphylaxis   Fentanyl     Hypotension    Ibuprofen     tachycardia   Meloxicam     tachycardia   Morphine And Related      Hypotension    Sulfamethoxazole-Trimethoprim Diarrhea and Nausea And Vomiting        Valsartan Other (See Comments)    Fatigue - "wiped out"   Other Rash and Hypertension    seafood    Consultations: none   Procedures/Studies: ECHOCARDIOGRAM COMPLETE  Result Date: 03/04/2022    ECHOCARDIOGRAM REPORT   Patient Name:   WHITLEIGH FERREIRO Date of Exam: 03/04/2022 Medical Rec #:  [id number]        Height:       64.0 in Accession #:    [id number]       Weight:       201.0 lb Date of Birth:  27-Nov-1955         BSA:  1.961 m Patient Age:    29 years         BP:           155/78 mmHg Patient Gender: F                HR:           66 bpm. Exam Location:  Inpatient Procedure: 2D Echo, Cardiac Doppler and Color Doppler Indications:    Atrial Fibrillation I48.91  History:        Patient has prior history of Echocardiogram examinations, most                 recent 02/12/2020. CHF, Arrythmias:paroxysmal supraventricular                 tachycardia; Risk Factors:Hypertension.  Sonographer:    Darlina Sicilian RDCS Referring Phys: [id number] [place]  1. Left ventricular ejection fraction, by estimation, is 55 to 60%. The left ventricle has normal function. The left ventricle has no regional wall motion abnormalities. Left ventricular diastolic parameters were normal.  2. Right ventricular systolic function is normal. The right ventricular size is normal. There is normal pulmonary artery systolic pressure.  3. The mitral valve is normal in structure. No evidence of mitral valve regurgitation. No evidence of mitral stenosis.  4. The tricuspid valve is abnormal.  5. The aortic valve is tricuspid. Aortic valve regurgitation is not visualized. No aortic stenosis is present.  6. The inferior vena cava is normal in size with greater than 50% respiratory variability, suggesting right atrial pressure of 3 mmHg. FINDINGS  Left Ventricle: Left ventricular ejection fraction, by estimation, is 55 to 60%. The  left ventricle has normal function. The left ventricle has no regional wall motion abnormalities. The left ventricular internal cavity size was normal in size. There is  no left ventricular hypertrophy. Left ventricular diastolic parameters were normal. Right Ventricle: The right ventricular size is normal. Right vetricular wall thickness was not well visualized. Right ventricular systolic function is normal. There is normal pulmonary artery systolic pressure. The tricuspid regurgitant velocity is 2.28 m/s, and with an assumed right atrial pressure of 3 mmHg, the estimated right ventricular systolic pressure is [id number]. Left Atrium: Left atrial size was normal in size. Right Atrium: Right atrial size was normal in size. Pericardium: There is no evidence of pericardial effusion. Mitral Valve: The mitral valve is normal in structure. No evidence of mitral valve regurgitation. No evidence of mitral valve stenosis. Tricuspid Valve: The tricuspid valve is abnormal. Tricuspid valve regurgitation is mild . No evidence of tricuspid stenosis. Aortic Valve: The aortic valve is tricuspid. Aortic valve regurgitation is not visualized. No aortic stenosis is present. Aortic valve mean gradient measures 4.0 mmHg. Aortic valve peak gradient measures 8.6 mmHg. Pulmonic Valve: The pulmonic valve was not well visualized. Pulmonic valve regurgitation is mild. No evidence of pulmonic stenosis. Aorta: The aortic root is normal in size and structure. Venous: The inferior vena cava is normal in size with greater than 50% respiratory variability, suggesting right atrial pressure of 3 mmHg. IAS/Shunts: No atrial level shunt detected by color flow Doppler.  LEFT VENTRICLE PLAX 2D LVIDd:         5.20 cm Diastology LVIDs:         2.80 cm LV e' medial:    8.84 cm/s LV PW:         0.70 cm LV E/e' medial:  10.5 LV IVS:  0.70 cm LV e' lateral:   10.10 cm/s                        LV E/e' lateral: 9.2  RIGHT VENTRICLE RV S prime:     10.60  cm/s TAPSE (M-mode): 1.7 cm LEFT ATRIUM             Index LA diam:        3.80 cm 1.94 cm/m LA Vol (A2C):   28.0 ml 14.28 ml/m LA Vol (A4C):   41.4 ml 21.12 ml/m LA Biplane Vol: 34.1 ml 17.39 ml/m  AORTIC VALVE                   PULMONIC VALVE AV Vmax:           146.81 cm/s PR End Diast Vel: 1.90 msec AV Vmean:          91.293 cm/s AV VTI:            0.309 m AV Peak Grad:      8.6 mmHg AV Mean Grad:      4.0 mmHg LVOT Vmax:         106.00 cm/s LVOT Vmean:        72.500 cm/s LVOT VTI:          0.237 m LVOT/AV VTI ratio: 0.77  AORTA Ao Root diam: 2.60 cm Ao Asc diam:  3.00 cm MITRAL VALVE               TRICUSPID VALVE MV Area (PHT): 4.04 cm    TR Peak grad:   20.8 mmHg MV Decel Time: 188 msec    TR Vmax:        228.00 cm/s MV E velocity: 93.20 cm/s MV A velocity: 89.10 cm/s  SHUNTS MV E/A ratio:  1.05        Systemic VTI: 0.24 m Carlyle Dolly MD Electronically signed by Carlyle Dolly MD Signature Date/Time: 03/04/2022/1:57:41 PM    Final    DG Chest Portable 1 View  Result Date: 03/03/2022 CLINICAL DATA:  Chest pain EXAM: PORTABLE CHEST 1 VIEW COMPARISON:  07/14/2019 FINDINGS: Lungs are clear.  No pleural effusion or pneumothorax. The heart is normal in size. IMPRESSION: No evidence of acute cardiopulmonary disease. Electronically Signed   By: Julian Hy M.D.   On: 03/03/2022 23:28   (Echo, Carotid, EGD, Colonoscopy, ERCP)    Subjective:  Patient seen ambulating out in the hallway denies any chest pain palpitations shortness of breath Anxious to go home Waiting for the echo to be done Discharge Exam: Vitals:   03/04/22 0749 03/04/22 1151  BP: (!) 140/82 (!) 155/78  Pulse: 74 66  Resp: 18 16  Temp: 97.9 F (36.6 C) 97.9 F (36.6 C)  SpO2: 96% 98%   Vitals:   03/04/22 0430 03/04/22 0602 03/04/22 0749 03/04/22 1151  BP: 137/78  (!) 140/82 (!) 155/78  Pulse: 73  74 66  Resp: (!) 22  18 16   Temp:  97.8 F (36.6 C) 97.9 F (36.6 C) 97.9 F (36.6 C)  TempSrc:  Oral Oral Oral   SpO2: 94%  96% 98%  Weight:      Height:        General: Pt is alert, awake, not in acute distress Cardiovascular: RRR, S1/S2 +, no rubs, no gallops Respiratory: CTA bilaterally, no wheezing, no rhonchi Abdominal: Soft, NT, ND, bowel sounds + Extremities: no edema, no cyanosis  The results of significant diagnostics from this hospitalization (including imaging, microbiology, ancillary and laboratory) are listed below for reference.     Microbiology: No results found for this or any previous visit (from the past 240 hour(s)).   Labs: BNP (last 3 results) Recent Labs    03/04/22 0343  BNP [id number]   Basic Metabolic Panel: Recent Labs  Lab 03/03/22 2312 03/04/22 0115 03/04/22 0600  NA 140  --  141  K 3.6  --  3.9  CL 106  --  109  CO2 23  --  22  GLUCOSE 109*  --  92  BUN 18  --  16  CREATININE 1.06*  --  0.82  CALCIUM 9.4  --  9.3  MG  --  2.0 2.1   Liver Function Tests: Recent Labs  Lab 03/04/22 0600  AST 30  ALT 44  ALKPHOS 47  BILITOT 0.9  PROT 6.6  ALBUMIN 3.5   No results for input(s): "LIPASE", "AMYLASE" in the last 168 hours. No results for input(s): "AMMONIA" in the last 168 hours. CBC: Recent Labs  Lab 03/03/22 2312  WBC 9.7  NEUTROABS 6.0  HGB 14.9  HCT 45.3  MCV 100.4*  PLT 200   Cardiac Enzymes: No results for input(s): "CKTOTAL", "CKMB", "CKMBINDEX", "TROPONINI" in the last 168 hours. BNP: Invalid input(s): "POCBNP" CBG: No results for input(s): "GLUCAP" in the last 168 hours. D-Dimer No results for input(s): "DDIMER" in the last 72 hours. Hgb A1c No results for input(s): "HGBA1C" in the last 72 hours. Lipid Profile Recent Labs    03/04/22 1307  CHOL 195  HDL 42  LDLCALC 146*  TRIG 36  CHOLHDL 4.6   Thyroid function studies No results for input(s): "TSH", "T4TOTAL", "T3FREE", "THYROIDAB" in the last 72 hours.  Invalid input(s): "FREET3" Anemia work up Recent Labs    03/04/22 1307  VITAMINB12 288   Urinalysis     Component Value Date/Time   COLORURINE STRAW (A) 03/04/2022 0557   APPEARANCEUR HAZY (A) 03/04/2022 0557   LABSPEC 1.003 (L) 03/04/2022 0557   PHURINE 6.0 03/04/2022 [place] 03/04/2022 0557   HGBUR NEGATIVE 03/04/2022 0557   BILIRUBINUR NEGATIVE 03/04/2022 0557   KETONESUR 5 (A) 03/04/2022 0557   PROTEINUR NEGATIVE 03/04/2022 0557   NITRITE NEGATIVE 03/04/2022 0557   LEUKOCYTESUR LARGE (A) 03/04/2022 0557   Sepsis Labs Recent Labs  Lab 03/03/22 2312  WBC 9.7   Microbiology No results found for this or any previous visit (from the past 240 hour(s)).   Time coordinating discharge:  38 minutes  SIGNED:   Georgette Shell, MD  [facility] 03/04/2022, 3:13 PM

## 2022-03-04 NOTE — ED Notes (Signed)
ED TO INPATIENT HANDOFF REPORT  ED Nurse Name and Phone #:Sabas Frett [phone]  S Name/Age/Gender Erica Oneill 66 y.o. female Room/Bed: H012C/H012C  Code Status   Code Status: Full Code  Home/SNF/Other Home Patient oriented to: self, place, time, and situation Is this baseline? Yes   Triage Complete: Triage complete  Chief Complaint Atrial fibrillation with RVR [name]  Triage Note Pt BIB EMS from home c/o CP since 2115 tonight. Describes it as pressure in center of chest and pressure of back, 3 out of 10 pain. Pt took 325 ASA before EMS arrival. Pt had a previous event Monday night, per her cardiologist was told to come in. AO4    Allergies Allergies  Allergen Reactions   Doxycycline     Other reaction(s): Other (see comments) Makes her feels sick Makes her feels sick    Lidocaine Anaphylaxis   Rocephin [Ceftriaxone] Anaphylaxis   Fentanyl     Hypotension    Ibuprofen     tachycardia   Meloxicam     tachycardia   Morphine And Related     Hypotension    Sulfamethoxazole-Trimethoprim Diarrhea and Nausea And Vomiting        Valsartan Other (See Comments)    Fatigue - "wiped out"   Other Rash and Hypertension    seafood    Level of Care/Admitting Diagnosis ED Disposition     ED Disposition  Admit   Condition  --   Comment  Hospital Area: [facility] [[id number]]  Level of Care: Telemetry Cardiac [[id number]]  May place patient in observation at [facility] or [facility] if equivalent level of care is available:: No  Covid Evaluation: Asymptomatic - no recent exposure (last 10 days) testing not required  Diagnosis: Atrial fibrillation with RVR [name]  Admitting Physician: Angie Fava [[id number]]  Attending Physician: Angie Fava [[id number]]          B Medical/Surgery History Past Medical History:  Diagnosis Date   Acute cystitis with hematuria 12/13/2018   Arthralgia of multiple sites 08/28/2018   Chronic  diastolic heart failure (HCC)    COVID-19 12/2019   Essential hypertension 09/03/2018   Febrile illness 08/28/2018   Hematuria 08/28/2018   Hypertension    Long-term use of aspirin therapy 08/06/2018   Palpitations 09/03/2018   Pericarditis 07/20/2018   PSVT (paroxysmal supraventricular tachycardia) (HCC) 09/03/2018   Rectal bleeding 08/28/2018   Situational anxiety 08/28/2018   Syncope 07/20/2018   Past Surgical History:  Procedure Laterality Date   ABDOMINAL HYSTERECTOMY     CATARACT EXTRACTION W/PHACO Left 08/27/2019   Procedure: CATARACT EXTRACTION PHACO AND INTRAOCULAR LENS PLACEMENT (IOC) LEFT;  Surgeon: Lockie Mola, MD;  Location: [facility];  Service: Ophthalmology;  Laterality: Left;  6.08 0:53.6 11.4%   CATARACT EXTRACTION W/PHACO Right 09/17/2019   Procedure: CATARACT EXTRACTION PHACO AND INTRAOCULAR LENS PLACEMENT (IOC) RIGHT;  Surgeon: Lockie Mola, MD;  Location: [facility];  Service: Ophthalmology;  Laterality: Right;  4.83 0:42.7 11.3%   TORN MENISCUS IN LEFT KNEE Left      A IV Location/Drains/Wounds Patient Lines/Drains/Airways Status     Active Line/Drains/Airways     Name Placement date Placement time Site Days   Peripheral IV 03/03/22 20 G Posterior;Right Hand 03/03/22  2252  Hand  1            Intake/Output Last 24 hours  Intake/Output Summary (Last 24 hours) at 03/04/2022 1145 Last data filed at 03/04/2022 0800 Gross per  24 hour  Intake 120 ml  Output --  Net 120 ml    Labs/Imaging Results for orders placed or performed during the hospital encounter of 03/03/22 (from the past 48 hour(s))  CBC with Differential     Status: Abnormal   Collection Time: 03/03/22 11:12 PM  Result Value Ref Range   WBC 9.7 4.0 - 10.5 K/uL   RBC 4.51 3.87 - 5.11 MIL/uL   Hemoglobin 14.9 12.0 - 15.0 g/dL   HCT 45.3 36.0 - 46.0 %   MCV 100.4 (H) 80.0 - 100.0 fL   MCH 33.0 26.0 - 34.0 pg   MCHC 32.9 30.0 - 36.0 g/dL   RDW 12.3  11.5 - 15.5 %   Platelets 200 150 - 400 K/uL   nRBC 0.0 0.0 - 0.2 %   Neutrophils Relative % 61 %   Neutro Abs 6.0 1.7 - 7.7 K/uL   Lymphocytes Relative 24 %   Lymphs Abs 2.3 0.7 - 4.0 K/uL   Monocytes Relative 12 %   Monocytes Absolute 1.1 (H) 0.1 - 1.0 K/uL   Eosinophils Relative 1 %   Eosinophils Absolute 0.1 0.0 - 0.5 K/uL   Basophils Relative 1 %   Basophils Absolute 0.1 0.0 - 0.1 K/uL   Immature Granulocytes 1 %   Abs Immature Granulocytes 0.08 (H) 0.00 - 0.07 K/uL    Comment: Performed at [facility], [address], [place], [place] [id number]  Basic metabolic panel     Status: Abnormal   Collection Time: 03/03/22 11:12 PM  Result Value Ref Range   Sodium 140 135 - 145 mmol/L   Potassium 3.6 3.5 - 5.1 mmol/L   Chloride 106 98 - 111 mmol/L   CO2 23 22 - 32 mmol/L   Glucose, Bld 109 (H) 70 - 99 mg/dL    Comment: Glucose reference range applies only to samples taken after fasting for at least 8 hours.   BUN 18 8 - 23 mg/dL   Creatinine, Ser 1.06 (H) 0.44 - 1.00 mg/dL   Calcium 9.4 8.9 - 10.3 mg/dL   GFR, Estimated 57 (L) >60 mL/min    Comment: (NOTE) Calculated using the CKD-EPI Creatinine Equation (2021)    Anion gap 11 5 - 15    Comment: Performed at [facility] [address] [place], [place] [address]  Troponin I (High Sensitivity)     Status: None   Collection Time: 03/03/22 11:12 PM  Result Value Ref Range   Troponin I (High Sensitivity) 4 <18 ng/L    Comment: (NOTE) Elevated high sensitivity troponin I (hsTnI) values and significant  changes across serial measurements may suggest ACS but many other  chronic and acute conditions are known to elevate hsTnI results.  Refer to the "Links" section for chest pain algorithms and additional  guidance. Performed at [facility], [place] [address], [place], [place] [address]   Troponin I (High Sensitivity)     Status: None   Collection Time: 03/04/22  1:15 AM  Result Value Ref Range   Troponin I  (High Sensitivity) 6 <18 ng/L    Comment: (NOTE) Elevated high sensitivity troponin I (hsTnI) values and significant  changes across serial measurements may suggest ACS but many other  chronic and acute conditions are known to elevate hsTnI results.  Refer to the "Links" section for chest pain algorithms and additional  guidance. Performed at [facility], [place] [address] [place], [place] [address]   Magnesium  Status: None   Collection Time: 03/04/22  1:15 AM  Result Value Ref Range   Magnesium 2.0 1.7 - 2.4 mg/dL    Comment: Performed at [facility], [place] [address], [place], [place] [address]  Brain natriuretic peptide     Status: None   Collection Time: 03/04/22  3:43 AM  Result Value Ref Range   B Natriuretic Peptide 49.9 0.0 - 100.0 pg/mL    Comment: Performed at [facility] [address] [place], [place] [address]  Urinalysis, Complete w Microscopic Urine, Clean Catch     Status: Abnormal   Collection Time: 03/04/22  5:57 AM  Result Value Ref Range   Color, Urine STRAW (A) YELLOW   APPearance HAZY (A) CLEAR   Specific Gravity, Urine 1.003 (L) 1.005 - 1.030   pH 6.0 5.0 - 8.0   Glucose, UA NEGATIVE NEGATIVE mg/dL   Hgb urine dipstick NEGATIVE NEGATIVE   Bilirubin Urine NEGATIVE NEGATIVE   Ketones, ur 5 (A) NEGATIVE mg/dL   Protein, ur NEGATIVE NEGATIVE mg/dL   Nitrite NEGATIVE NEGATIVE   Leukocytes,Ua LARGE (A) NEGATIVE   RBC / HPF 0-5 0 - 5 RBC/hpf   WBC, UA 21-50 0 - 5 WBC/hpf   Bacteria, UA RARE (A) NONE SEEN   Squamous Epithelial / LPF 0-5 0 - 5   Mucus PRESENT     Comment: Performed at [facility], [place] [address] [place], [place] [address]  Rapid urine drug screen (hospital performed)     Status: None   Collection Time: 03/04/22  5:57 AM  Result Value Ref Range   Opiates NONE DETECTED NONE DETECTED   Cocaine NONE DETECTED NONE DETECTED   Benzodiazepines NONE DETECTED NONE DETECTED   Amphetamines NONE DETECTED NONE DETECTED    Tetrahydrocannabinol NONE DETECTED NONE DETECTED   Barbiturates NONE DETECTED NONE DETECTED    Comment: (NOTE) DRUG SCREEN FOR MEDICAL PURPOSES ONLY.  IF CONFIRMATION IS NEEDED FOR ANY PURPOSE, NOTIFY LAB WITHIN 5 DAYS.  LOWEST DETECTABLE LIMITS FOR URINE DRUG SCREEN Drug Class                     Cutoff (ng/mL) Amphetamine and metabolites    1000 Barbiturate and metabolites    200 Benzodiazepine                 [id number] Tricyclics and metabolites     300 Opiates and metabolites        300 Cocaine and metabolites        300 THC                            50 Performed at [facility], [place] [address] [place], [place] [address]   Troponin I (High Sensitivity)     Status: None   Collection Time: 03/04/22  6:00 AM  Result Value Ref Range   Troponin I (High Sensitivity) 9 <18 ng/L    Comment: (NOTE) Elevated high sensitivity troponin I (hsTnI) values and significant  changes across serial measurements may suggest ACS but many other  chronic and acute conditions are known to elevate hsTnI results.  Refer to the "Links" section for chest pain algorithms and additional  guidance. Performed at [facility], [place] [address], [place],  [address]   Comprehensive metabolic panel     Status: None   Collection Time: 03/04/22  6:00 AM  Result Value  Ref Range   Sodium 141 135 - 145 mmol/L   Potassium 3.9 3.5 - 5.1 mmol/L   Chloride 109 98 - 111 mmol/L   CO2 22 22 - 32 mmol/L   Glucose, Bld 92 70 - 99 mg/dL    Comment: Glucose reference range applies only to samples taken after fasting for at least 8 hours.   BUN 16 8 - 23 mg/dL   Creatinine, Ser 7.84 0.44 - 1.00 mg/dL   Calcium 9.3 8.9 - 69.6 mg/dL   Total Protein 6.6 6.5 - 8.1 g/dL   Albumin 3.5 3.5 - 5.0 g/dL   AST 30 15 - 41 U/L   ALT 44 0 - 44 U/L   Alkaline Phosphatase 47 38 - 126 U/L   Total Bilirubin 0.9 0.3 - 1.2 mg/dL   GFR, Estimated >29 >52 mL/min    Comment: (NOTE) Calculated using the CKD-EPI Creatinine  Equation (2021)    Anion gap 10 5 - 15    Comment: Performed at [facility], [address], [place], [place] [address]  Magnesium     Status: None   Collection Time: 03/04/22  6:00 AM  Result Value Ref Range   Magnesium 2.1 1.7 - 2.4 mg/dL    Comment: Performed at [facility], [address], [place], [place] [address]   DG Chest Portable 1 View  Result Date: 03/03/2022 CLINICAL DATA:  Chest pain EXAM: PORTABLE CHEST 1 VIEW COMPARISON:  07/14/2019 FINDINGS: Lungs are clear.  No pleural effusion or pneumothorax. The heart is normal in size. IMPRESSION: No evidence of acute cardiopulmonary disease. Electronically Signed   By: Charline Bills M.D.   On: 03/03/2022 23:28    Pending Labs Unresulted Labs (From admission, onward)     Start     Ordered   03/05/22 0500  CBC  Daily at 5am,   R      03/03/22 2332   03/04/22 0927  Lipid panel  Add-on,   AD        03/04/22 0926   03/04/22 0925  Vitamin B12  Add-on,   AD        03/04/22 0924   03/04/22 0924  VITAMIN D 25 Hydroxy (Vit-D Deficiency, Fractures)  Add-on,   AD        03/04/22 0923            Vitals/Pain Today's Vitals   03/04/22 0430 03/04/22 0602 03/04/22 0749 03/04/22 0750  BP: 137/78  (!) 140/82   Pulse: 73  74   Resp: (!) 22  18   Temp:  97.8 F (36.6 C) 97.9 F (36.6 C)   TempSrc:  Oral Oral   SpO2: 94%  96%   Weight:      Height:      PainSc:    0-No pain    Isolation Precautions No active isolations  Medications Medications  apixaban (ELIQUIS) tablet 5 mg (5 mg Oral Given 03/04/22 1002)  acetaminophen (TYLENOL) tablet 650 mg (has no administration in time range)    Or  acetaminophen (TYLENOL) suppository 650 mg (has no administration in time range)  metoprolol tartrate (LOPRESSOR) tablet 12.5 mg (has no administration in time range)  potassium chloride SA (KLOR-CON M) CR tablet 40 mEq (40 mEq Oral Given 03/04/22 0555)    Mobility walks Low fall risk   Focused Assessments Cardiac  Assessment Handoff:  Cardiac Rhythm: Normal sinus rhythm Lab Results  Component Value Date   TROPONINI 0.06 (HH) 07/22/2018  Lab Results  Component Value Date   DDIMER 0.45 01/13/2020   Does the Patient currently have chest pain? No    R Recommendations: See Admitting Provider Note  Report given to:   Additional Notes:

## 2022-03-04 NOTE — H&P (Signed)
History and Physical    PLEASE NOTE THAT DRAGON DICT[employer] SOFTWARE WAS USED IN THE CONSTRUCTION OF THIS NOTE.   Erica Oneill SEG:[id number] DOB: 07/13/1955 DOA: 03/03/2022  PCP: Myrlene Broker, MD  Patient coming from: home   I have personally briefly reviewed patient's old medical records in [place]  Chief Complaint: Chest pain  HPI: Erica Oneill is a 66 y.o. female with medical history significant for paroxysmal SVT, essential hypertension, chronic diastolic heart failure, who is admitted to [facility] on 03/03/2022 with new diagnosis of atrial fibrillation with RVR after presenting from home to [facility] ED complaining of chest pain.   Patient reports an episode of substernal chest pressure, without radiation, starting around 2100 on 03/03/2022 after she had laid down.  Pain is constant for greater than 90 minutes and was nonexertional, nonpleuritic, not reproducible with direct palpation of the anterior chest wall.  Did not receive any sublingual nitroglycerin.  Was associated with new onset palpitations as well as increased heart rate.  Denies any associated significant shortness of breath, dizziness, or any associated presyncope or syncope.  Denies any associated nausea/vomiting.  Chest pain remained constant until she experienced spontaneous conversion of atrial fibrillation to sinus rhythm in [facility] ED this evening.  She notes ensuing complete resolution of her chest pain within a few seconds of conversion to sinus rhythm, and denies any ensuing recurrence of her chest pain.  She had experienced a similar episode of substernal chest pressure associated with palpitations and elevated heart rate on Monday, 02/27/2022, although the intensity of the symptoms were not as severe as that experienced at the episode earlier this evening, and the duration of the symptoms 5 days ago was noted to be less, with the episode resolving spontaneously after about 40 minutes.   No recent  orthopnea, PND, or new onset peripheral edema.  No recent cough, wheezing, mopped assist, new lower extremity erythema, or calf tenderness.  No recent trauma or travel.  Denies any recent subjective fever, chills, rigors, or generalized myalgias.  No recent dysuria, gross hematuria, abdominal pain, melena, hematochezia.  Denies any regular recurrent or recent alcohol consumption, no history of recreational drug use.  She is a lifelong non-smoker.   Per chart review, she underwent prolonged heart monitoring as an outpatient in May 2022, during which time she wore Holter monitor for greater than 12 days, during which she was noted to exhibit rare PACs, rare PVCs, and 2 episodes of paroxysmal SVT lasting up to 13 seconds, in the absence of any documentation of atrial fibrillation.  Per my discussions with her, patient confirms no known prior atrial fibrillation history.  In May 2022, she also underwent cardiac CT scan, which was associated with a calcium score of 0.   Per chart review, most recent echocardiogram occurred in August 2021, at which time she reports she was experiencing acute pericarditis.  This echocardiogram is notable for LVEF 60 to 65%, no focal wall motion abnormalities, grade 1 diastolic dysfunction, normal right ventricular systolic function, normal size of bilateral atria, and trivial mitral regurgitation.   In the setting of essential pretension, she is on nebivolol as an outpatient.  After the episode of chest pain, palpitations, and elevated heart rate on Monday, 02/27/2022, she was instructed by her outpatient cardiologist to increase the dose of nebivolol from 5 to 10 mg, which she is compliantly done.  She notes that she was on metoprolol several years ago, but that this medication was  discontinued as she felt fatigued while taking this beta-blocker.  She takes a daily baby aspirin, but otherwise no additional blood thinners, including no formal anticoagulation.  No known history of  gastrointestinal bleed.  Per chart review, TSH was checked as an outpatient on 02/22/2022, and found to be 3.122.    ED Course:  Vital signs in the ED were notable for the following: Afebrile; initial heart rate 1 49-1 51, following which she spontaneously converted to sinus rhythm, with ensuing heart rates in the 80s; blood pressure during her atrial fibrillation with RVR noted to be 141/98, with ensuing blood pressure following conversion to sinus rhythm now noted to be 123/69; respiratory rate 16-23, oxygen saturation 96 to 97% on room air.  Labs were notable for the following: BMP notable for the following: Sodium 140, potassium 3.6, bicarbonate 23, creatinine 1.06.  High-sensitivity troponin I initially noted to be 4, with repeat value trending up slightly to 6.  CBC notable for white blood cell count 9700, hemoglobin 14.9, platelet count 200.  Imaging and additional notable ED work-up: Presenting EKG showed atrial fibrillation with RVR, ventricular rate 151, and no evidence of T wave or ST changes.  Following spontaneous conversion to sinus rhythm, updated EKG was obtained and demonstrated sinus rhythm with heart rate 90, normal intervals, no evidence of T wave or ST changes.  Chest x-ray showed no evidence of acute cardiopulmonary process.  EDP discussed the patient's case with the on-call cardiology fellow, Dr. Humphrey Rolls, Who confirmed atrial fibrillation on initial EKG and recommended admission to the hospitalist service for further evaluation and management of new diagnosis of atrial fibrillation with RVR.  Additionally, Dr. Yancey Flemings recommended initiation of formal anticoagulation on DOAC for thromboembolic prophylaxis as well as pursuit of echocardiogram in the morning.  While in the ED, the following were administered: Eliquis 5 mg p.o. x1.  Of note, diltiazem drip was initiated briefly before the patient experienced continuous conversion to sinus rhythm, following which diltiazem drip was  discontinued.   Subsequently, the patient was admitted for observation for further evaluation management of new diagnosis of atrial fibrillation with RVR after presenting with atypical chest pain that completely resolved concomitant with spontaneous conversion to sinus rhythm.    Review of Systems: As per HPI otherwise 10 point review of systems negative.   Past Medical History:  Diagnosis Date   Acute cystitis with hematuria 12/13/2018   Arthralgia of multiple sites 08/28/2018   COVID-19 12/2019   Essential hypertension 09/03/2018   Febrile illness 08/28/2018   Hematuria 08/28/2018   Hypertension    Long-term use of aspirin therapy 08/06/2018   Palpitations 09/03/2018   Pericarditis 07/20/2018   PSVT (paroxysmal supraventricular tachycardia) ([place]) 09/03/2018   Rectal bleeding 08/28/2018   Situational anxiety 08/28/2018   Syncope 07/20/2018    Past Surgical History:  Procedure Laterality Date   ABDOMINAL HYSTERECTOMY     CATARACT EXTRACTION W/PHACO Left 08/27/2019   Procedure: CATARACT EXTRACTION PHACO AND INTRAOCULAR LENS PLACEMENT ([place]) LEFT;  Surgeon: Leandrew Koyanagi, MD;  Location: [place];  Service: Ophthalmology;  Laterality: Left;  6.08 0:53.6 11.4%   CATARACT EXTRACTION W/PHACO Right 09/17/2019   Procedure: CATARACT EXTRACTION PHACO AND INTRAOCULAR LENS PLACEMENT ([place]) RIGHT;  Surgeon: Leandrew Koyanagi, MD;  Location: [place];  Service: Ophthalmology;  Laterality: Right;  4.83 0:42.7 11.3%   TORN MENISCUS IN LEFT KNEE Left     Social History:  reports that she has never smoked. She has never used smokeless tobacco. She  reports that she does not drink alcohol and does not use drugs.   Allergies  Allergen Reactions   Doxycycline     Other reaction(s): Other (see comments) Makes her feels sick Makes her feels sick    Lidocaine Anaphylaxis   Rocephin [Ceftriaxone] Anaphylaxis   Fentanyl     Hypotension    Ibuprofen     tachycardia   Meloxicam      tachycardia   Morphine And Related     Hypotension    Sulfamethoxazole-Trimethoprim Diarrhea and Nausea And Vomiting        Valsartan Other (See Comments)    Fatigue - "wiped out"   Other Rash and Hypertension    seafood    Family History  Problem Relation Age of Onset   Heart disease Mother    Hypertension Mother    Heart attack Mother 27   Cancer Father    Heart attack Maternal Grandfather 41    Family history reviewed and not pertinent    Prior to Admission medications   Medication Sig Start Date End Date Taking? Authorizing Provider  aspirin EC 81 MG tablet Take 81 mg by mouth daily. Swallow whole.    [provider]  nebivolol (BYSTOLIC) 10 MG tablet Take 1 tablet (10 mg total) by mouth daily. 02/22/22   End, Harrell Gave, MD     Objective    Physical Exam: Vitals:   03/03/22 2300 03/03/22 2345 03/04/22 0000 03/04/22 0030  BP: (!) 141/98  118/66 123/69  Pulse: (!) 150 87 84 82  Resp: $Remo'19 20 17 16  'Lwvox$ Temp:      TempSrc:      SpO2: 97% 96% 96% 94%  Weight:      Height:        General: appears to be stated age; alert, oriented Skin: warm, dry, no rash Head:  AT/[place] Mouth:  Oral mucosa membranes appear moist, normal dentition Neck: supple; trachea midline Heart:  RRR; did not appreciate any M/R/G Lungs: CTAB, did not appreciate any wheezes, rales, or rhonchi Abdomen: + BS; soft, ND, NT Vascular: 2+ pedal pulses b/l; 2+ radial pulses b/l Extremities: no peripheral edema, no muscle wasting Neuro: strength and sensation intact in upper and lower extremities b/l     Labs on Admission: I have personally reviewed following labs and imaging studies  CBC: Recent Labs  Lab 03/03/22 2312  WBC 9.7  NEUTROABS 6.0  HGB 14.9  HCT 45.3  MCV 100.4*  PLT 917   Basic Metabolic Panel: Recent Labs  Lab 03/03/22 2312  NA 140  K 3.6  CL 106  CO2 23  GLUCOSE 109*  BUN 18  CREATININE 1.06*  CALCIUM 9.4   GFR: Estimated Creatinine Clearance:  55.6 mL/min (A) (by C-G formula based on SCr of 1.06 mg/dL (H)). Liver Function Tests: No results for input(s): "AST", "ALT", "ALKPHOS", "BILITOT", "PROT", "ALBUMIN" in the last 168 hours. No results for input(s): "LIPASE", "AMYLASE" in the last 168 hours. No results for input(s): "AMMONIA" in the last 168 hours. Coagulation Profile: No results for input(s): "INR", "PROTIME" in the last 168 hours. Cardiac Enzymes: No results for input(s): "CKTOTAL", "CKMB", "CKMBINDEX", "TROPONINI" in the last 168 hours. BNP (last 3 results) No results for input(s): "PROBNP" in the last 8760 hours. HbA1C: No results for input(s): "HGBA1C" in the last 72 hours. CBG: No results for input(s): "GLUCAP" in the last 168 hours. Lipid Profile: No results for input(s): "CHOL", "HDL", "LDLCALC", "TRIG", "CHOLHDL", "LDLDIRECT" in the last 72 hours.  Thyroid Function Tests: No results for input(s): "TSH", "T4TOTAL", "FREET4", "T3FREE", "THYROIDAB" in the last 72 hours. Anemia Panel: No results for input(s): "VITAMINB12", "FOLATE", "FERRITIN", "TIBC", "IRON", "RETICCTPCT" in the last 72 hours. Urine analysis:    Component Value Date/Time   COLORURINE AMBER (A) 07/22/2018 1944   APPEARANCEUR CLEAR 07/22/2018 1944   LABSPEC 1.025 07/22/2018 1944   PHURINE 5.0 07/22/2018 1944   GLUCOSEU NEGATIVE 07/22/2018 1944   HGBUR NEGATIVE 07/22/2018 1944   BILIRUBINUR NEGATIVE 07/22/2018 [place] NEGATIVE 07/22/2018 1944   PROTEINUR 30 (A) 07/22/2018 1944   NITRITE NEGATIVE 07/22/2018 1944   LEUKOCYTESUR SMALL (A) 07/22/2018 1944    Radiological Exams on Admission: DG Chest Portable 1 View  Result Date: 03/03/2022 CLINICAL DATA:  Chest pain EXAM: PORTABLE CHEST 1 VIEW COMPARISON:  07/14/2019 FINDINGS: Lungs are clear.  No pleural effusion or pneumothorax. The heart is normal in size. IMPRESSION: No evidence of acute cardiopulmonary disease. Electronically Signed   By: Julian Hy M.D.   On: 03/03/2022 23:28      EKG: Independently reviewed, with result as described above.    Assessment/Plan   Principal Problem:   Atrial fibrillation with RVR (HCC) Active Problems:   Essential hypertension   Atypical chest pain   Chronic diastolic CHF (congestive heart failure) (HCC)       #) Atrial fibrillation with RVR: In the context of a history of paroxysmal SVT, but no known history of atrial fibrillation, the patient presents with atrial fibrillation RVR, with initial heart rates in the 150s, before spontaneous conversion to sinus rhythm it appears that she was symptomatic with her atrial fibrillation with RVR, noting new onset chest pain that completely resolved concurrent with spontaneous conversion to sinus rhythm.  Of note, blood pressure tolerated the associated elevated heart rates, without any evidence of corresponding hypotension.  Presenting EKG showed somewhat regular QRS complexes, raising possibility of SVT.  However, EDP discussed patient's case with on-call cardiology who reviewed EKG, confirmed the presence of atrial fibrillation with RVR with associated [place] recommendations initiation of DOAC as well as echocardiogram.   No overt instigating factors leading to this new finding of atrial fibrillation with RVR.  No clinical or radiographic evidence to suggest acutely, state heart failure, nor any clinical evidence to suggest underlying infectious process, although we will also check urinalysis to further evaluate.  TSH checked as an outpatient approximately 10 days ago was within normal limits.  No history of alcohol use no recreational drug abuse.  In considering any other potential contributory/provoking factors, the patient mentions that she has a tooth associated with advanced decay, and is scheduled for extraction of this tooth over the next few weeks, raising the possibility of increased risk for atrial fibrillation with RVR in the setting of portal pain control as resents to this dental  pathology, with increased risk for associated dental infection posing independent risk for atrial fibrillation with RVR, although no systemic signs of underlying infectious process, and SIRS criteria not met for sepsis at the present time.  ACS felt to be less likely in the absence of any recent typical chest discomfort, will EKG shows no evidence of acute ischemic changes, including no evidence of STEMI, while high-sensitivity troponin I x2 have been nonelevated and flat.  Clinically, presentation appears less suggestive of acute pulmonary embolism.  In the setting of CHA2DS2-VASc score of 4, there is an indication for chronic anticoagulation for thromboembolic prophylaxis.  She has received her first dose of Eliquis this  evening.  Of note, considering modifications to her existing outpatient beta-blocker regimen may be warranted, noting that she is currently on nebivolol 10 mg p.o. daily as an outpatient.   Plan: Monitor strict I's & O's and daily weights. Monitor on telemetry. Check serum Mg level with prn supplementation to maintain levels of greater than or equal to 2.0.  Potassium chloride 40 mill equivalents p.o. x1 dose now.  Repeat CMP/CBC in the AM.  Continue home nebivolol for now, with consideration for transitioning to metoprolol.  Continue Eliquis, which was initiated in the ED this evening.  Echocardiogram in the morning.  Check urinalysis, urinary drug screen.  Repeat troponin in the morning.        #) Atypical chest pain: Nonexertional, nonradiating chest pressure that appears to have been concomitant with onset of atrial fibrillation with RVR, with complete resolution of his chest discomfort within seconds of spontaneous conversion to sinus rhythm.  Suspect that this atypical chest pain was as a consequence of her atrial fibrillation with RVR as opposed to representing ACS leading to new onset atrial fibrillation with RVR as a consequence of relative ischemia.  Of note, troponin x2  have been nonelevated and flat, while EKG is performed in the ED today showed no overt evidence of acute ischemic changes, including no evidence of STEMI.  Additionally, chest x-ray shows no evidence of acute process, including no evidence of pneumothorax.  Additionally, previous outpatient cardiac work-up included cardiac CT scan in May 2022, which was associated with a calcium score of 0, which is also reassuring from the standpoint of further diminishing likelihood of presenting ACS.  Plan: Further evaluation management of presenting new diagnosis of atrial fibrillation with RVR, including echocardiogram in the morning.  We will continue to trend serial troponin values.  Monitor on telemetry.  Supplementation of serum potassium level, as above.  Check serum magnesium level.  Repeat CMP and CBC in the morning.           #) Essential Hypertension: documented h/o such, with outpatient antihypertensive regimen including nebivolol.  SBP's in the ED today: 120s to 140s mmHg.   Plan: Close monitoring of subsequent BP via routine VS.  Will  cont home nebivolol for now, but with consideration to changing to metoprolol in the setting of new diagnosis of atrial fibrillation with RVR.  Monitor on telemetry.  Further evaluation management to presenting atrial fibrillation with RVR, including pursuit of echo in the morning.              #) Chronic diastolic heart failure: documented history of such, with most recent echocardiogram performed August 2021, notable for LVEF 60 to 65% as well as grade 1 diastolic dysfunction, with additional details as conveyed above. No clinical or radiographic evidence to suggest acutely decompensated heart failure at this time. home diuretic regimen reportedly consists of the following: None.   Plan: monitor strict I's & O's and daily weights. Repeat CMP in AM. Check serum mag level.  Echocardiogram in the morning as component of further evaluation new diagnosis of  atrial fibrillation with RVR, as above.         DVT prophylaxis: Eliquis, new medication started this evening  Code Status: Full code Family Communication: I discussed the patient's case with her husband, husband, who is present at bedside Disposition Plan: Per Rounding Team Consults called: EDP discussed the patient's case with the on-call cardiologist, Dr. Humphrey Rolls, As further detailed above;  Admission status: Observation    PLEASE NOTE THAT  DRAGON DICTATION SOFTWARE WAS USED IN THE CONSTRUCTION OF THIS NOTE.   [place] DO [facility]  From [place]   03/04/2022, 1:09 AM

## 2022-03-04 NOTE — Progress Notes (Addendum)
  Echocardiogram 2D Echocardiogram has been performed.  Madiha Bambrick M     

## 2022-03-07 ENCOUNTER — Encounter: Payer: Self-pay | Admitting: Internal Medicine

## 2022-03-07 NOTE — Progress Notes (Unsigned)
Follow-up Outpatient Visit Date: 03/08/2022  Primary Care Provider: Hadley Pen, MD [address] [place] [address]  Chief Complaint: Hospital follow-up with continued palpitations  HPI:  Erica Oneill is a 66 y.o. female with history of questionable recent atrial fibrillation, pericarditis, PSVT, hypertension, and syncope, who presents for follow-up of atrial fibrillation.  I last saw her 2 weeks ago for evaluation of an episode of elevated heart rates and palpitations.  EMS EKG was most consistent with a long RP tachycardia.  We agreed to increase nebivolol to 10 mg daily but defer additional testing.  She did not tolerate this change well, as it caused her to have headaches.  She presented to [facility] ED last week with chest pain and was told that she was in atrial fibrillation with rapid ventricular response (only EKG demonstrating tachycardia during this admission is more consistent with SVT).  She spontaneously converted to sinus rhythm in the ED with prompt resolution of her chest pain.  She was discharged on metoprolol and apixaban.  Since leaving the hospital, Erica Oneill notes that her heart rate continues to jump quickly whenever she walks.  She has had some spontaneous episodes of tachycardia as well.  She does not feel like the metoprolol is helping her much (12.5 mg twice daily).  Her blood pressures have also trended up since being switched from nebivolol to metoprolol.  She has some vague soreness in her chest but nothing as severe as what she felt when she went to the ED.  She has not had shortness of breath.  She notes that she needs some dental work done soon that will require sedation/anesthesia.  She is concerned that this could precipitate tachycardia.  --------------------------------------------------------------------------------------------------  Past Medical History:  Diagnosis Date   Acute cystitis with hematuria 12/13/2018   Arthralgia of multiple  sites 08/28/2018   Chronic diastolic heart failure (HCC)    COVID-19 12/2019   Essential hypertension 09/03/2018   Febrile illness 08/28/2018   Hematuria 08/28/2018   Hypertension    Long-term use of aspirin therapy 08/06/2018   Palpitations 09/03/2018   Paroxysmal atrial fibrillation (HCC) 02/2022   Pericarditis 07/20/2018   PSVT (paroxysmal supraventricular tachycardia) (HCC) 09/03/2018   Rectal bleeding 08/28/2018   Situational anxiety 08/28/2018   Syncope 07/20/2018   Past Surgical History:  Procedure Laterality Date   ABDOMINAL HYSTERECTOMY     CATARACT EXTRACTION W/PHACO Left 08/27/2019   Procedure: CATARACT EXTRACTION PHACO AND INTRAOCULAR LENS PLACEMENT (IOC) LEFT;  Surgeon: Lockie Mola, MD;  Location: [facility];  Service: Ophthalmology;  Laterality: Left;  6.08 0:53.6 11.4%   CATARACT EXTRACTION W/PHACO Right 09/17/2019   Procedure: CATARACT EXTRACTION PHACO AND INTRAOCULAR LENS PLACEMENT (IOC) RIGHT;  Surgeon: Lockie Mola, MD;  Location: [facility];  Service: Ophthalmology;  Laterality: Right;  4.83 0:42.7 11.3%   TORN MENISCUS IN LEFT KNEE Left     No outpatient medications have been marked as taking for the 03/08/22 encounter (Office Visit) with Dominick Morella, Cristal Deer, MD.    Allergies: Doxycycline, Lidocaine, Rocephin [ceftriaxone], Fentanyl, Ibuprofen, Meloxicam, Morphine and related, Sulfamethoxazole-trimethoprim, Valsartan, and Other  Social History   Tobacco Use   Smoking status: Never   Smokeless tobacco: Never  Vaping Use   Vaping Use: Never used  Substance Use Topics   Alcohol use: Never   Drug use: Never    Family History  Problem Relation Age of Onset   Heart disease Mother    Hypertension Mother  Heart attack Mother 56   Cancer Father    Heart attack Maternal Grandfather 43    Review of Systems: A 12-system review of systems was performed and was negative except as noted in the  HPI.  --------------------------------------------------------------------------------------------------  Physical Exam: BP (!) 142/84 (BP Location: Left Arm, Patient Position: Sitting, Cuff Size: Large)   Pulse 75   Ht 5\' 4"  (1.626 m)   Wt 199 lb (90.3 kg)   SpO2 97%   BMI 34.16 kg/m   General:  NAD. Neck: No JVD or HJR. Lungs: Clear to auscultation bilaterally without wheezes or crackles. Heart: Regular rate and rhythm without murmurs, rubs, or gallops. Abdomen: Soft, nontender, nondistended. Extremities: No lower extremity edema.  EKG: Normal sinus rhythm with borderline LVH and nonspecific ST/T changes.  No significant change from prior tracing on 03/03/2022.  Lab Results  Component Value Date   WBC 9.7 03/03/2022   HGB 14.9 03/03/2022   HCT 45.3 03/03/2022   MCV 100.4 (H) 03/03/2022   PLT 200 03/03/2022    Lab Results  Component Value Date   NA 141 03/04/2022   K 3.9 03/04/2022   CL 109 03/04/2022   CO2 22 03/04/2022   BUN 16 03/04/2022   CREATININE 0.82 03/04/2022   GLUCOSE 92 03/04/2022   ALT 44 03/04/2022    Lab Results  Component Value Date   CHOL 195 03/04/2022   HDL 42 03/04/2022   LDLCALC 146 (H) 03/04/2022   TRIG 36 03/04/2022   CHOLHDL 4.6 03/04/2022    --------------------------------------------------------------------------------------------------  ASSESSMENT AND PLAN: PSVT and questionable paroxysmal atrial fibrillation: While notes from recent hospitalization indicate atrial fibrillation, the only EKG available to me is more consistent with PSVT.  I am unable to review telemetry from that hospitalization, however.  Given continued palpitations, I have recommended increasing metoprolol to tartrate to 25 mg twice daily.  If she tolerates this well and continues to have some breakthrough palpitations, I have advised her that it would be okay to go up to 50 mg twice daily.  I will refer Erica Oneill to Dr. 05/04/2022 for further recommendations  regarding her PSVT and questionable atrial fibrillation.  Pending consultation with Dr. Lalla Brothers, we will continue apixaban in the setting of a CHA2DS2-VASc score of at least 3 (age, gender, and hypertension).  Hypertension: Blood pressure mildly elevated today.  We will increase metoprolol to tartrate to 25 mg twice daily.  Follow-up: Return to clinic in 3 months.  Lalla Brothers, MD 03/08/2022 2:11 PM

## 2022-03-08 ENCOUNTER — Ambulatory Visit: Payer: Medicare Other | Attending: Internal Medicine | Admitting: Internal Medicine

## 2022-03-08 ENCOUNTER — Encounter: Payer: Self-pay | Admitting: Internal Medicine

## 2022-03-08 VITALS — BP 142/84 | HR 75 | Ht 64.0 in | Wt 199.0 lb

## 2022-03-08 DIAGNOSIS — I10 Essential (primary) hypertension: Secondary | ICD-10-CM | POA: Insufficient documentation

## 2022-03-08 DIAGNOSIS — I480 Paroxysmal atrial fibrillation: Secondary | ICD-10-CM | POA: Diagnosis present

## 2022-03-08 DIAGNOSIS — I471 Supraventricular tachycardia: Secondary | ICD-10-CM

## 2022-03-08 LAB — VITAMIN D 25 HYDROXY (VIT D DEFICIENCY, FRACTURES)

## 2022-03-08 MED ORDER — METOPROLOL TARTRATE 25 MG PO TABS: 25.0000 mg | ORAL_TABLET | Freq: Two times a day (BID) | ORAL | 2 refills | Status: DC

## 2022-03-08 NOTE — Patient Instructions (Signed)
Medication Instructions:   Your physician has recommended you make the following change in your medication:    INCREASE your Metoprolol Tartrate to 25 MG twice a day.  *If you need a refill on your cardiac medications before your next appointment, please call your pharmacy*    Follow-Up: At [facility], you and your health needs are our priority.  As part of our continuing mission to provide you with exceptional heart care, we have created designated Provider Care Teams.  These Care Teams include your primary Cardiologist (physician) and Advanced Practice Providers (APPs -  Physician Assistants and Nurse Practitioners) who all work together to provide you with the care you need, when you need it.  We recommend signing up for the patient portal called "MyChart".  Sign up information is provided on this After Visit Summary.  MyChart is used to connect with patients for Virtual Visits (Telemedicine).  Patients are able to view lab/test results, encounter notes, upcoming appointments, etc.  Non-urgent messages can be sent to your provider as well.   To learn more about what you can do with MyChart, go to [email or web address].    Your next appointment:   3 month(s)  The format for your next appointment:   In Person  Provider:   You may see Yvonne Kendall, MD or one of the following Advanced Practice Providers on your designated Care Team:   Nicolasa Ducking, NP Eula Listen, PA-C Cadence Fransico Michael, PA-C Charlsie Quest, NP     Important Information About Sugar

## 2022-03-13 ENCOUNTER — Telehealth: Payer: Self-pay | Admitting: Internal Medicine

## 2022-03-13 NOTE — Telephone Encounter (Signed)
STAT if HR is under 50 or over 120 (normal HR is 60-100 beats per minute)  What is your heart rate? 86 sitting   Do you have a log of your heart rate readings (document readings)?   03/12/22 5:00 pm 120 while eating dinner  Do you have any other symptoms? Tired and slight SOB with exertion     Started on 50 mg metoprolol and is still having the same issues with elevated HR when getting up to walk or when eating. BP today was 143/73. Please advise.

## 2022-03-13 NOTE — Telephone Encounter (Signed)
Will fwd to Dr. Saunders Revel to review and advise.

## 2022-03-13 NOTE — Telephone Encounter (Signed)
Erica Oneill should continue her current medications and follow-up with Dr. Quentin Ore in the EP clinic this week, as scheduled.  [name], MD [facility]

## 2022-03-14 NOTE — Telephone Encounter (Signed)
Called patient and informed her of Dr. Darnelle Bos recommendation below. Patient stated that she has been feeling much better with the 50 MG of Metoprolol. She will follow up with Dr. Quentin Ore this week as scheduled.

## 2022-03-14 NOTE — Progress Notes (Unsigned)
Electrophysiology Office Note:    Date:  03/15/2022   ID:  Erica Oneill, DOB 12-01-1955, MRN [id number]  PCP:  Myrlene Broker, MD  [facility] Cardiologist:  [name], MD  [facility] Electrophysiologist:  Vickie Epley, MD   Referring MD: [name], MD   Chief Complaint: AF  History of Present Illness:    Erica Oneill is a 66 y.o. female who presents for an evaluation of AF at the request of Dr End. Their medical history includes possible AF, HTN, syncope.  Dr End saw the patient after ER visit for SVT and chest pain.   The patient's first significant episode of SVT was August 30 when her heart rate got 150 bpm.  Her next episode was the 1 that landed her in the emergency department with heart rates as high as 187.  During that episode the heart rate started at around 187 bpm and then settled down around 144 bpm.  She tells me that prior to the August 30 episode she had scattered palpitations but no sustained episodes of SVT.  She tells me that both of her sons have SVT.  One takes propranolol to help control his symptoms.  He takes as needed calcium channel blockers when he has breakthrough spells.  She also has a sibling that has SVT.    Past Medical History:  Diagnosis Date   Acute cystitis with hematuria 12/13/2018   Arthralgia of multiple sites 08/28/2018   Chronic diastolic heart failure ([place])    COVID-19 12/2019   Essential hypertension 09/03/2018   Febrile illness 08/28/2018   Hematuria 08/28/2018   Hypertension    Long-term use of aspirin therapy 08/06/2018   Palpitations 09/03/2018   Paroxysmal atrial fibrillation (HCC) 02/2022   Pericarditis 07/20/2018   PSVT (paroxysmal supraventricular tachycardia) ([place]) 09/03/2018   Rectal bleeding 08/28/2018   Situational anxiety 08/28/2018   Syncope 07/20/2018    Past Surgical History:  Procedure Laterality Date   ABDOMINAL HYSTERECTOMY     CATARACT EXTRACTION W/PHACO Left 08/27/2019    Procedure: CATARACT EXTRACTION PHACO AND INTRAOCULAR LENS PLACEMENT ([place]) LEFT;  Surgeon: Leandrew Koyanagi, MD;  Location: [place];  Service: Ophthalmology;  Laterality: Left;  6.08 0:53.6 11.4%   CATARACT EXTRACTION W/PHACO Right 09/17/2019   Procedure: CATARACT EXTRACTION PHACO AND INTRAOCULAR LENS PLACEMENT ([place]) RIGHT;  Surgeon: Leandrew Koyanagi, MD;  Location: [place];  Service: Ophthalmology;  Laterality: Right;  4.83 0:42.7 11.3%   TORN MENISCUS IN LEFT KNEE Left     Current Medications: Current Meds  Medication Sig   acetaminophen (TYLENOL) 500 MG tablet Take 500 mg by mouth every 6 (six) hours as needed for moderate pain.   apixaban (ELIQUIS) 5 MG TABS tablet Take 1 tablet (5 mg total) by mouth 2 (two) times daily.   metoprolol tartrate (LOPRESSOR) 25 MG tablet Take 1 tablet (25 mg total) by mouth 2 (two) times daily. (Patient taking differently: Take 25 mg by mouth 2 (two) times daily. Take 2 tablets by mouth in the morning and 1 1/2 tablets at nighy)     Allergies:   Doxycycline, Lidocaine, Rocephin [ceftriaxone], Fentanyl, Ibuprofen, Meloxicam, Morphine and related, Sulfamethoxazole-trimethoprim, Valsartan, and Other   Social History   Socioeconomic History   Marital status: Married    Spouse name: Not on file   Number of children: Not on file   Years of education: Not on file   Highest education level: Not on file  Occupational History   Not  on file  Tobacco Use   Smoking status: Never   Smokeless tobacco: Never  Vaping Use   Vaping Use: Never used  Substance and Sexual Activity   Alcohol use: Never   Drug use: Never   Sexual activity: Not on file  Other Topics Concern   Not on file  Social History Narrative   Not on file   Social Determinants of Health   Financial Resource Strain: Not on file  Food Insecurity: Not on file  Transportation Needs: Not on file  Physical Activity: Not on file  Stress: Not on file  Social  Connections: Not on file     Family History: The patient's family history includes Cancer in her father; Heart attack (age of onset: 35) in her maternal grandfather; Heart attack (age of onset: 34) in her mother; Heart disease in her mother; Hypertension in her mother.  ROS:   Please see the history of present illness.    All other systems reviewed and are negative.  EKGs/Labs/Other Studies Reviewed:    The following studies were reviewed today:  03/04/2022 Echo EF 55 RV normal No MR  11/03/2020 [facility] The patient was monitored for 12 days, 19 hours. The predominant rhythm was sinus with an average rate of 78 bpm (range 46-128 bpm and sinus). There were rare PACs and PVCs. Two atrial runs lasting up to 13.3 seconds with a maximum rate of 169 bpm were observed. No sustained arrhythmia or prolonged pause occurred. Patient triggered events correspond to sinus rhythm, PACs, PVCs, and one episode of PSVT.  03/03/2022 ECG shows SVT, long RP    10/28/2020 CTA coronary IMPRESSION: 1. Normal coronary calcium score of 0. Patient is low risk for coronary events.   2. Normal coronary origin with right dominance.   3. No evidence of CAD.   4. CAD-RADS 0. Consider non-atherosclerotic causes of chest pain.     Recent Labs: 02/22/2022: TSH 3.122 03/03/2022: Hemoglobin 14.9; Platelets 200 03/04/2022: ALT 44; B Natriuretic Peptide 49.9; BUN 16; Creatinine, Ser 0.82; Magnesium 2.1; Potassium 3.9; Sodium 141  Recent Lipid Panel    Component Value Date/Time   CHOL 195 03/04/2022 1307   TRIG 36 03/04/2022 1307   HDL 42 03/04/2022 1307   CHOLHDL 4.6 03/04/2022 1307   VLDL 7 03/04/2022 1307   LDLCALC 146 (H) 03/04/2022 1307    Physical Exam:    VS:  BP (!) 142/84   Pulse (!) 108   Ht _0  (1.626 m)   Wt 198 lb (89.8 kg)   BMI 33.99 kg/m     Wt Readings from Last 3 Encounters:  03/15/22 198 lb (89.8 kg)  03/08/22 199 lb (90.3 kg)  03/03/22 201 lb (91.2 kg)     GEN:  Well nourished,  well developed in no acute distress HEENT: Normal NECK: No JVD; No carotid bruits LYMPHATICS: No lymphadenopathy CARDIAC: RRR, no murmurs, rubs, gallops RESPIRATORY:  Clear to auscultation without rales, wheezing or rhonchi  ABDOMEN: Soft, non-tender, non-distended MUSCULOSKELETAL:  No edema; No deformity  SKIN: Warm and dry NEUROLOGIC:  Alert and oriented x 3 PSYCHIATRIC:  Normal affect       ASSESSMENT:    1. PSVT (paroxysmal supraventricular tachycardia) ([place])   2. Primary hypertension    PLAN:    In order of problems listed above:  #PSVT Recurrent, symptomatic. Unclear diagnosis. Possible AT vs AVRT vs atypical AVNRT.  I do not see evidence of flutter waves or atrial fibrillation.  I discussed treatment options including  continuing beta-blockers versus EP study with ablation.  I think it is reasonable to start with beta-blockade to see if this helps control her symptoms.  I will transition her metoprolol tartrate to metoprolol succinate 50 mg by mouth twice daily.  She will stop her metoprolol tartrate today and make the transition.   #Reported history of AF I do not see clear evidence of AF on any available ECG or tele strip. Previous diagnosis was made with ECG showing SVT.  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  This was reportedly years ago.  It is unclear to me whether or not she is received any alternatives such as bupivacaine.  This complicates the decision to implant a loop recorder given the need for local anesthesia.  I will put a referral in for an allergist to weigh in regarding local anesthesia options versus possibly testing for reaction to lidocaine alternatives such as bupivacaine.  Alternatively, could plan to implant a loop recorder under MAC anesthesia at the hospital.  The loop recorder would help [place] monitor her rhythm continuously to exclude the presence of atrial fibrillation which would be important for her given she has  been prescribed Eliquis.  If she does not need to be on Eliquis for stroke prophylaxis given the absence of a true A-fib diagnosis, it would be ideal to avoid Eliquis use.  I will plan to have a appointment with her in 3 months to follow-up the allergist's recommendations and to discuss next steps.  We will also reassess control of her SVT with the metoprolol.  #Hypertension Slightly above goal today.  Recommend checking blood pressures 1-2 times per week at home and recording the values.  Recommend bringing these recordings to the primary care physician.   Follow-up 3 months with me.    Total time spent with patient today 60 minutes. This includes reviewing records, evaluating the patient and coordinating care.  Medication Adjustments/Labs and Tests Ordered: Current medicines are reviewed at length with the patient today.  Concerns regarding medicines are outlined above.  No orders of the defined types were placed in this encounter.  No orders of the defined types were placed in this encounter.    Signed, Hilton Cork. Quentin Ore, MD, [facility], [facility] 03/15/2022 4:58 PM    Electrophysiology [facility]

## 2022-03-15 ENCOUNTER — Encounter: Payer: Self-pay | Admitting: Cardiology

## 2022-03-15 ENCOUNTER — Ambulatory Visit: Payer: Medicare Other | Attending: Cardiology | Admitting: Cardiology

## 2022-03-15 VITALS — BP 142/84 | HR 108 | Ht 64.0 in | Wt 198.0 lb

## 2022-03-15 DIAGNOSIS — I10 Essential (primary) hypertension: Secondary | ICD-10-CM | POA: Diagnosis not present

## 2022-03-15 DIAGNOSIS — I471 Supraventricular tachycardia: Secondary | ICD-10-CM | POA: Diagnosis not present

## 2022-03-15 MED ORDER — METOPROLOL SUCCINATE ER 50 MG PO TB24: 50.0000 mg | ORAL_TABLET | Freq: Two times a day (BID) | ORAL | 3 refills | Status: DC

## 2022-03-15 NOTE — Patient Instructions (Signed)
Medication Instructions:  Stop Metoprolol tartrate  Start Metoprolol Succinate 50 mg two times a day   *If you need a refill on your cardiac medications before your next appointment, please call your pharmacy*   Lab Work: None  If you have labs (blood work) drawn today and your tests are completely normal, you will receive your results only by: [place] (if you have MyChart) OR A paper copy in the mail If you have any lab test that is abnormal or we need to change your treatment, we will call you to review the results.   Testing/Procedures: None    Follow-Up: At [facility], you and your health needs are our priority.  As part of our continuing mission to provide you with exceptional heart care, we have created designated Provider Care Teams.  These Care Teams include your primary Cardiologist (physician) and Advanced Practice Providers (APPs -  Physician Assistants and Nurse Practitioners) who all work together to provide you with the care you need, when you need it.  We recommend signing up for the patient portal called "MyChart".  Sign up information is provided on this After Visit Summary.  MyChart is used to connect with patients for Virtual Visits (Telemedicine).  Patients are able to view lab/test results, encounter notes, upcoming appointments, etc.  Non-urgent messages can be sent to your provider as well.   To learn more about what you can do with MyChart, go to [email or web address].    Your next appointment:   3 month(s)  The format for your next appointment:   In Person  Provider:   Lars Mage, MD    Other Instructions None   Important Information About Sugar

## 2022-03-28 ENCOUNTER — Ambulatory Visit: Payer: Medicare Other | Admitting: Cardiology

## 2022-04-19 ENCOUNTER — Institutional Professional Consult (permissible substitution): Payer: Medicare Other | Admitting: Cardiology

## 2022-05-23 ENCOUNTER — Ambulatory Visit: Payer: Self-pay | Admitting: Allergy

## 2022-06-07 ENCOUNTER — Encounter: Payer: Self-pay | Admitting: Cardiology

## 2022-06-07 ENCOUNTER — Ambulatory Visit: Payer: Medicare Other | Attending: Internal Medicine | Admitting: Cardiology

## 2022-06-07 VITALS — BP 142/70 | HR 70 | Ht 63.0 in | Wt 200.4 lb

## 2022-06-07 DIAGNOSIS — I10 Essential (primary) hypertension: Secondary | ICD-10-CM

## 2022-06-07 DIAGNOSIS — I471 Supraventricular tachycardia, unspecified: Secondary | ICD-10-CM

## 2022-06-07 DIAGNOSIS — R002 Palpitations: Secondary | ICD-10-CM | POA: Diagnosis not present

## 2022-06-07 NOTE — Patient Instructions (Signed)
Medication Instructions:  Your physician recommends that you continue on your current medications as directed. Please refer to the Current Medication list given to you today.  *If you need a refill on your cardiac medications before your next appointment, please call your pharmacy* Follow-Up: At [facility], you and your health needs are our priority.  As part of our continuing mission to provide you with exceptional heart care, we have created designated Provider Care Teams.  These Care Teams include your primary Cardiologist (physician) and Advanced Practice Providers (APPs -  Physician Assistants and Nurse Practitioners) who all work together to provide you with the care you need, when you need it.  Follow up with Dr. Lalla Brothers as needed   Important Information About Sugar

## 2022-06-07 NOTE — Progress Notes (Signed)
Electrophysiology Office Follow up Visit Note:    Date:  06/07/2022   ID:  Erica Oneill, DOB 1955-07-26, MRN [id number]  PCP:  Myrlene Broker, MD  [facility] Cardiologist:  [name], MD  [facility] Electrophysiologist:  Vickie Epley, MD    Interval History:    Erica Oneill is a 66 y.o. female who presents for a follow up visit.   I last saw the patient March 15, 2022 for SVT.  Rare episodes of SVT occur with different heart rates it is unclear the mechanism behind her arrhythmia.  She also carries a reported history of atrial fibrillation but I do not see any evidence of this on any EKG or telemetry strip.  A loop recorder was discussed with the patient has a documented allergy to lidocaine of anaphylaxis.  I put in a referral to an allergist at her last appointment.  She is on metoprolol.  Since her last appointment, she has not gone to an allergist.  She says she is not planning to go.  She tells me that she has been under a lot of stress recently helping care for her mother.  She tells me that she will feel brief episodes of palpitations but nothing sustained.  She has not been able to capture an episode using her [facility] or Apple watch.       Past Medical History:  Diagnosis Date   Acute cystitis with hematuria 12/13/2018   Arthralgia of multiple sites 08/28/2018   Chronic diastolic heart failure ([place])    COVID-19 12/2019   Essential hypertension 09/03/2018   Febrile illness 08/28/2018   Hematuria 08/28/2018   Hypertension    Long-term use of aspirin therapy 08/06/2018   Palpitations 09/03/2018   Paroxysmal atrial fibrillation (H[facility]) 02/2022   Pericarditis 07/20/2018   PSVT (paroxysmal supraventricular tachycardia) 09/03/2018   Rectal bleeding 08/28/2018   Situational anxiety 08/28/2018   Syncope 07/20/2018    Past Surgical History:  Procedure Laterality Date   ABDOMINAL HYSTERECTOMY     CATARACT EXTRACTION W/PHACO Left 08/27/2019    Procedure: CATARACT EXTRACTION PHACO AND INTRAOCULAR LENS PLACEMENT ([place]) LEFT;  Surgeon: Leandrew Koyanagi, MD;  Location: [place];  Service: Ophthalmology;  Laterality: Left;  6.08 0:53.6 11.4%   CATARACT EXTRACTION W/PHACO Right 09/17/2019   Procedure: CATARACT EXTRACTION PHACO AND INTRAOCULAR LENS PLACEMENT ([place]) RIGHT;  Surgeon: Leandrew Koyanagi, MD;  Location: [place];  Service: Ophthalmology;  Laterality: Right;  4.83 0:42.7 11.3%   TORN MENISCUS IN LEFT KNEE Left     Current Medications: Current Meds  Medication Sig   acetaminophen (TYLENOL) 500 MG tablet Take 500 mg by mouth every 6 (six) hours as needed for moderate pain.   apixaban (ELIQUIS) 5 MG TABS tablet Take 1 tablet (5 mg total) by mouth 2 (two) times daily.   metoprolol succinate (TOPROL-XL) 50 MG 24 hr tablet Take 1 tablet (50 mg total) by mouth in the morning and at bedtime. Take with or immediately following a meal.   [DISCONTINUED] rosuvastatin (CRESTOR) 10 MG tablet Take 1 tablet (10 mg total) by mouth daily.     Allergies:   Doxycycline, Lidocaine, Rocephin [ceftriaxone], Fentanyl, Ibuprofen, Meloxicam, Morphine and related, Sulfamethoxazole-trimethoprim, Valsartan, and Other   Social History   Socioeconomic History   Marital status: Married    Spouse name: Not on file   Number of children: Not on file   Years of education: Not on file   Highest education level: Not on  file  Occupational History   Not on file  Tobacco Use   Smoking status: Never   Smokeless tobacco: Never  Vaping Use   Vaping Use: Never used  Substance and Sexual Activity   Alcohol use: Never   Drug use: Never   Sexual activity: Not on file  Other Topics Concern   Not on file  Social History Narrative   Not on file   Social Determinants of Health   Financial Resource Strain: Not on file  Food Insecurity: Not on file  Transportation Needs: Not on file  Physical Activity: Not on file  Stress:  Not on file  Social Connections: Not on file     Family History: The patient's family history includes Cancer in her father; Heart attack (age of onset: 32) in her maternal grandfather; Heart attack (age of onset: 44) in her mother; Heart disease in her mother; Hypertension in her mother.  ROS:   Please see the history of present illness.    All other systems reviewed and are negative.  EKGs/Labs/Other Studies Reviewed:    The following studies were reviewed today:     Recent Labs: 02/22/2022: TSH 3.122 03/03/2022: Hemoglobin 14.9; Platelets 200 03/04/2022: ALT 44; B Natriuretic Peptide 49.9; BUN 16; Creatinine, Ser 0.82; Magnesium 2.1; Potassium 3.9; Sodium 141  Recent Lipid Panel    Component Value Date/Time   CHOL 195 03/04/2022 1307   TRIG 36 03/04/2022 1307   HDL 42 03/04/2022 1307   CHOLHDL 4.6 03/04/2022 1307   VLDL 7 03/04/2022 1307   LDLCALC 146 (H) 03/04/2022 1307    Physical Exam:    VS:  BP (!) 142/70   Pulse 70   Ht 5\' 3"  (1.6 m)   Wt 200 lb 6.4 oz (90.9 kg)   SpO2 97%   BMI 35.50 kg/m     Wt Readings from Last 3 Encounters:  06/07/22 200 lb 6.4 oz (90.9 kg)  03/15/22 198 lb (89.8 kg)  03/08/22 199 lb (90.3 kg)     GEN:  Well nourished, well developed in no acute distress.  Obese HEENT: Normal NECK: No JVD; No carotid bruits LYMPHATICS: No lymphadenopathy CARDIAC: RRR, no murmurs, rubs, gallops RESPIRATORY:  Clear to auscultation without rales, wheezing or rhonchi  ABDOMEN: Soft, non-tender, non-distended MUSCULOSKELETAL:  No edema; No deformity  SKIN: Warm and dry NEUROLOGIC:  Alert and oriented x 3 PSYCHIATRIC:  Normal affect        ASSESSMENT:    1. PSVT (paroxysmal supraventricular tachycardia)   2. Palpitations   3. Primary hypertension    PLAN:    In order of problems listed above:   #SVT On metoprolol succinate 50 twice daily I still do not have any evidence of atrial fibrillation.  She continues to have episodes of  tachycardia/palpitations but she has not been able to capture 1 of these on her Apple watch or [facility].  I have encouraged her to try to capture an episode of her arrhythmia using 1 of these devices so that I can make a determination about her need for Eliquis.  If we are able to capture an episode of her palpitations/tachycardia with a rhythm strip and it not represent atrial fibrillation, I would recommend stopping the Eliquis.  We are can leave follow-up to an as-needed basis but if she captures a rhythm strip of her arrhythmia/palpitations, she can send that in over epic for [facility] to help guide her regarding her anticoagulation use.  #Hypertension Systolic slightly above goal today.  Recommend checking blood pressures 1-2 times per week at home and recording the values.  Recommend bringing these recordings to the primary care physician.   Follow-up as needed.    Medication Adjustments/Labs and Tests Ordered: Current medicines are reviewed at length with the patient today.  Concerns regarding medicines are outlined above.  No orders of the defined types were placed in this encounter.  No orders of the defined types were placed in this encounter.    Signed, Lars Mage, MD, [facility], [facility] 06/07/2022 9:04 AM    Electrophysiology [facility]

## 2022-06-08 ENCOUNTER — Ambulatory Visit: Payer: Medicare Other | Admitting: Internal Medicine

## 2022-08-22 ENCOUNTER — Telehealth: Payer: Self-pay | Admitting: Internal Medicine

## 2022-08-22 NOTE — Telephone Encounter (Signed)
Pt called to report she's been experiencing bilateral leg swelling x 2 weeks. She denies SOB, significant weight increase, or recent changes in diet. She reports swelling decreases with elevation  Appointment scheduled for 3/1 for further evaluations.

## 2022-08-22 NOTE — Telephone Encounter (Signed)
Pt c/o swelling: STAT is pt has developed SOB within 24 hours  If swelling, where is the swelling located? "In my legs, but I feel like its all over"   How much weight have you gained and in what time span? Not sure   Have you gained 3 pounds in a day or 5 pounds in a week? Not sure   Do you have a log of your daily weights (if so, list)? Did not take   Are you currently taking a fluid pill?  No   Are you currently SOB? No   Have you traveled recently? No   Pt states she noticed the past few days swelling in her legs. She wants to know if the medication she's been taking is causing this. Please advise.

## 2022-08-23 NOTE — Progress Notes (Signed)
Cardiology Office Note    Date:  08/25/2022   ID:  BAISLEY GASSNER, DOB [birth year], MRN [id number]  PCP:  Myrlene Broker, MD  Cardiologist:  [name], MD  Electrophysiologist:  Vickie Epley, MD   Chief Complaint: Swelling  History of Present Illness:   Erica Oneill is a 67 y.o. female with history of normal coronary arteries by coronary CTA, questionable history of A-fib on apixaban, pericarditis, PSVT, syncope, and HTN who presents for evaluation of lower extremity, hand, and facial swelling over the past 2 weeks.  She was previously followed by Dr. Geraldo Pitter though has subsequently transitioned her care to Dr. Saunders Revel.  She was admitted to the hospital in 06/2018 for acute pericarditis.  Echo at that time demonstrated an EF of 55 to 60%, no regional wall motion abnormalities, and a small pericardial effusion circumferential to the heart without evidence of hemodynamic compromise.  Zio patch in 03/2019 showed a predominant rhythm of sinus with an average rate of 79 bpm (range 50 to to 119 bpm), 39 seconds atrial run with a maximum rate of 169 bpm and occasional PVCs.  Repeat echo in 04/2019, to evaluate pericardial effusion, demonstrated an EF of 60 to 65%, borderline LVH, grade 1 diastolic dysfunction, normal RV systolic function and ventricular cavity size, no significant valvular abnormalities, normal PASP, and no evidence of pericardial effusion.  Echo in 01/2020, to evaluate incidentally reported cardiomegaly on outside chest radiograph, showed an EF of 60 to 65%, no regional wall motion abnormalities, grade 1 diastolic dysfunction, normal RV systolic function, ventricular cavity size, and PASP.  There was no significant pericardial effusion to suggest recurrent pericarditis.  Repeat Zio patch in 09/2020 showed a predominant rhythm of sinus with an average rate of 78 bpm (range 46 to 128 bpm in sinus), 2 atrial runs lasting up to 13.3 seconds with a maximum rate of 169 bpm, rare PACs  and PVCs, no sustained arrhythmias or prolonged pauses.  Patient triggered events corresponded to sinus rhythm, PACs, PVCs, and 1 episode of PSVT.  Coronary CTA in 10/2020 showed no significant noncardiac abnormalities within the visualized portion of the thoracic cavity.  Cardiac over read showed a calcium score of 0 with no evidence of CAD and normal sized aorta.  She was seen in late 01/2022 for an episode of elevated heart rate and palpitations.  EMS EKG was most consistent with a long RP tachycardia.  In this setting, she was started on Bystolic, though this led to headaches.  Given this, she presented to the [facility] ED in 02/2022 with chest pain and palpitations.  High-sensitivity troponin negative x 3.  BNP 49.  She was told the EKG showed new onset A-fib with RVR with ventricular to 151 (however upon outpatient cardiology review this EKG was consistent with SVT and not A-fib).  She spontaneously converted to sinus rhythm with Bystolic and Cardizem and was placed on apixaban.  Echo showed an EF of 55 to 60%, no regional wall motion abnormalities, normal LV diastolic function parameters, normal RV systolic function and ventricular cavity size, normal PASP, mild tricuspid regurgitation, and an estimated right atrial pressure of 3 mmHg.  At discharge, her Bystolic was transitioned to metoprolol and she was initiated on apixaban.  However, upon following up with cardiology on 03/08/2022, the only available EKG for review demonstrated tachycardia more consistent with SVT.  Telemetry was unable to be reviewed.  At that visit, she noted her heart rate would jump up quickly whenever  she ambulated.  She also reported some spontaneous episodes of tachycardia.  She did not feel like the metoprolol was helping her much.  She also noted her blood pressure trended up since being transition from Bystolic to metoprolol.  She was also worried upcoming dental work/sedation could precipitate tachycardia.  Given ongoing  palpitations and elevated BP, Lopressor was titrated to 25 mg twice daily.  She was referred to EP for further recommendations regarding her PSVT and questionable history of A-fib.  She was subsequently evaluated by Dr. Quentin Ore with EP on 03/15/2022 with noted unclear diagnosis of PSVT felt to possibly be related to atrial tachycardia, versus AVRT versus atypical AVNRT.  There was no evidence of flutter waves or A-fib.  She was transition to Toprol-XL 50 mg twice daily.  EP did not see clear evidence of A-fib on available EKG or telemetry strip.  The day of placing a loop recorder was complicated due to a documented history of lidocaine causing anaphylaxis.  It was unclear if she had ever received any alternatives such as bupivacaine.  In this setting, she was referred to an allergist to weigh in regarding local anesthesia options.  She most recently followed up with the EP in 05/2022, she had not yet gone to the allergist and indicated she was not planning to go.  She was under increased stress with helping with her mother.  She reported brief palpitations that she had been unable to capture on her [employer] mobile device or [employer].  There was still no documented evidence of A-fib.  In the absence of being unable to capture these episodes, she was continued on apixaban with recommendation to discontinue anticoagulation if these captured episodes did not demonstrate evidence of A-fib.  EP follow-up was recommended on a as needed basis.  She contacted our office on 08/22/2022 reporting a 2-week history of lower extremity swelling that improved with leg elevation.  In this setting, appointment was scheduled for today.  She comes in noting a 2-week history of lower extremity swelling, and swelling, and several facial swelling.  She reports her weight has gone up 1 pound over the past 48 hours.  She is without progressive dyspnea or orthopnea.  She does add some salt to some foods though reports not an excessive  amount.  She eats out at restaurants approximately 2-3 times per week.  When compared to our scale, her weight is up 2 pounds when compared to her visit in December 2023.  No chest pain.  She does continue to note palpitations that are short-lived, typically less than 1 minute.  She has had approximately 4-5 episodes of these palpitations since she was last seen by EP.  She remains on apixaban.  No falls or symptoms concerning for bleeding.  Her blood pressure has been running a little higher more recently.  She attributes this to increased stress surrounding the health of her mother.   Labs independently reviewed: 02/2022 - TC 195, TG 36, HDL 42, LDL 146, magnesium 2.1, potassium 3.9, BUN 16, serum creatinine 0.82, albumin 3.5, AST/ALT normal, A1c 5.0, Hgb 14.9, PLT 200 01/2022 - TSH normal  Past Medical History:  Diagnosis Date   Acute cystitis with hematuria 12/13/2018   Arthralgia of multiple sites 08/28/2018   Chronic diastolic heart failure ([place])    COVID-19 12/2019   Essential hypertension 09/03/2018   Febrile illness 08/28/2018   Hematuria 08/28/2018   Hypertension    Long-term use of aspirin therapy 08/06/2018   Palpitations 09/03/2018  Paroxysmal atrial fibrillation (H[facility]) 02/2022   Pericarditis 07/20/2018   PSVT (paroxysmal supraventricular tachycardia) 09/03/2018   Rectal bleeding 08/28/2018   Situational anxiety 08/28/2018   Syncope 07/20/2018    Past Surgical History:  Procedure Laterality Date   ABDOMINAL HYSTERECTOMY     CATARACT EXTRACTION W/PHACO Left 08/27/2019   Procedure: CATARACT EXTRACTION PHACO AND INTRAOCULAR LENS PLACEMENT ([place]) LEFT;  Surgeon: Leandrew Koyanagi, MD;  Location: [facility];  Service: Ophthalmology;  Laterality: Left;  6.08 0:53.6 11.4%   CATARACT EXTRACTION W/PHACO Right 09/17/2019   Procedure: CATARACT EXTRACTION PHACO AND INTRAOCULAR LENS PLACEMENT ([place]) RIGHT;  Surgeon: Leandrew Koyanagi, MD;  Location: [facility];   Service: Ophthalmology;  Laterality: Right;  4.83 0:42.7 11.3%   TORN MENISCUS IN LEFT KNEE Left     Current Medications: Current Meds  Medication Sig   acetaminophen (TYLENOL) 500 MG tablet Take 500 mg by mouth every 6 (six) hours as needed for moderate pain.   apixaban (ELIQUIS) 5 MG TABS tablet Take 1 tablet (5 mg total) by mouth 2 (two) times daily.   furosemide (LASIX) 20 MG tablet Take 1 tablet (20 mg total) by mouth as needed (As needed for weight gain or shortness of breath).   metoprolol succinate (TOPROL-XL) 50 MG 24 hr tablet Take 1 tablet (50 mg total) by mouth in the morning and at bedtime. Take with or immediately following a meal.   potassium chloride SA (KLOR-CON M) 20 MEQ tablet Take 1 tablet (20 mEq total) by mouth as needed (TAKE with Furosemide).    Allergies:   Doxycycline, Lidocaine, Rocephin [ceftriaxone], Fentanyl, Ibuprofen, Meloxicam, Morphine and related, Sulfamethoxazole-trimethoprim, Valsartan, and Other   Social History   Socioeconomic History   Marital status: Married    Spouse name: Not on file   Number of children: Not on file   Years of education: Not on file   Highest education level: Not on file  Occupational History   Not on file  Tobacco Use   Smoking status: Never   Smokeless tobacco: Never  Vaping Use   Vaping Use: Never used  Substance and Sexual Activity   Alcohol use: Never   Drug use: Never   Sexual activity: Not on file  Other Topics Concern   Not on file  Social History Narrative   Not on file   Social Determinants of Health   Financial Resource Strain: Not on file  Food Insecurity: Not on file  Transportation Needs: Not on file  Physical Activity: Not on file  Stress: Not on file  Social Connections: Not on file     Family History:  The patient's family history includes Cancer in her father; Heart attack (age of onset: 51) in her maternal grandfather; Heart attack (age of onset: 19) in her mother; Heart disease in her  mother; Hypertension in her mother.  ROS:   12-point review of systems is negative unless otherwise noted in HPI.   EKGs/Labs/Other Studies Reviewed:    Studies reviewed were summarized above. The additional studies were reviewed today:  2D echo 03/04/2022: 1. Left ventricular ejection fraction, by estimation, is 55 to 60%. The  left ventricle has normal function. The left ventricle has no regional  wall motion abnormalities. Left ventricular diastolic parameters were  normal.   2. Right ventricular systolic function is normal. The right ventricular  size is normal. There is normal pulmonary artery systolic pressure.   3. The mitral valve is normal in structure. No evidence of mitral valve  regurgitation. No evidence of mitral stenosis.   4. The tricuspid valve is abnormal.   5. The aortic valve is tricuspid. Aortic valve regurgitation is not  visualized. No aortic stenosis is present.   6. The inferior vena cava is normal in size with greater than 50%  respiratory variability, suggesting right atrial pressure of 3 mmHg.  __________  Coronary CTA 10/28/2020: Aorta:  Normal size.  No calcifications.  No dissection.   Aortic Valve:  Trileaflet.  No calcifications.   Coronary Arteries:  Normal coronary origin.  Right dominance.   RCA is a dominant artery that gives rise to PDA and PLA. There is no plaque.   Left main gives rise to LAD and LCX arteries. There is no LM disease.   LAD has no plaque.   LCX is a non-dominant artery that gives rise to two obtuse marginal branches. There is no plaque.   Other findings:   Normal pulmonary vein drainage into the left atrium.   Normal left atrial appendage without a thrombus.   Normal size of the pulmonary artery.   IMPRESSION: 1. Normal coronary calcium score of 0. Patient is low risk for coronary events. 2. Normal coronary origin with right dominance. 3. No evidence of CAD. 4. CAD-RADS 0. Consider non-atherosclerotic causes  of chest pain. __________  Elwyn Reach patch 09/2020: The patient was monitored for 12 days, 19 hours. The predominant rhythm was sinus with an average rate of 78 bpm (range 46-128 bpm and sinus). There were rare PACs and PVCs. Two atrial runs lasting up to 13.3 seconds with a maximum rate of 169 bpm were observed. No sustained arrhythmia or prolonged pause occurred. Patient triggered events correspond to sinus rhythm, PACs, PVCs, and one episode of PSVT.   Predominantly sinus rhythm with rare PACs and PVCs as well as 2 episodes of PSVT lasting up to 13 seconds. __________  2D echo 02/12/2020: 1. Left ventricular ejection fraction, by estimation, is 60 to 65%. The  left ventricle has normal function. The left ventricle has no regional  wall motion abnormalities. Left ventricular diastolic parameters are  consistent with Grade I diastolic  dysfunction (impaired relaxation).   2. Right ventricular systolic function is normal. The right ventricular  size is normal. There is normal pulmonary artery systolic pressure.  __________  2D echo 05/05/2019: 1. Left ventricular ejection fraction, by visual estimation, is 60 to  65%. The left ventricle has normal function. There is borderline left  ventricular hypertrophy.   2. Left ventricular diastolic parameters are consistent with Grade I  diastolic dysfunction (impaired relaxation).   3. Global right ventricle has normal systolic function.The right  ventricular size is normal. No increase in right ventricular wall  thickness.   4. Left atrial size was normal.   5. Right atrial size was normal.   6. The mitral valve is normal in structure. No evidence of mitral valve  regurgitation. No evidence of mitral stenosis.   7. The tricuspid valve is normal in structure. Tricuspid valve  regurgitation is not demonstrated.   8. The aortic valve is normal in structure. Aortic valve regurgitation is  not visualized. No evidence of aortic valve sclerosis or  stenosis.   9. The pulmonic valve was normal in structure. Pulmonic valve  regurgitation is not visualized.  10. Normal pulmonary artery systolic pressure.  11. The inferior vena cava is normal in size with greater than 50%  respiratory variability, suggesting right atrial pressure of 3 mmHg.  __________  Elwyn Reach patch 03/2019:  Baseline rhythm: Sinus   Minimum heart rate: 52 BPM.  Average heart rate: 79 BPM.  Maximal heart rate 119 BPM.  And 169 was the maximum heart rate during the 39-second atrial run   Atrial arrhythmia: 39-second atrial run with maximum heart rate of 169.  Asymptomatic   Ventricular arrhythmia: Occasional PVCs   Conduction abnormality: None significant   Symptoms: None significant    Conclusion:  Mildly abnormal but largely unremarkable event monitor.  Patient had a brief asymptomatic atrial run as mentioned above. __________  2D echo 07/22/2018: - Left ventricle: The cavity size was normal. Systolic function was    normal. The estimated ejection fraction was in the range of 55%    to 60%. Wall motion was normal; there were no regional wall    motion abnormalities.  - Pericardium, extracardiac: A small pericardial effusion was    identified circumferential to the heart. There was no evidence of    hemodynamic compromise.   Impressions:   - There is no visual evidence of hemodynamic compromise as it    pertains to small pericardial effusion.     EKG:  EKG is ordered today.  The EKG ordered today demonstrates NSR, 69 bpm, LVH no acute st/t changes  Recent Labs: 02/22/2022: TSH 3.122 03/03/2022: Hemoglobin 14.9; Platelets 200 03/04/2022: ALT 44; B Natriuretic Peptide 49.9; BUN 16; Creatinine, Ser 0.82; Magnesium 2.1; Potassium 3.9; Sodium 141  Recent Lipid Panel    Component Value Date/Time   CHOL 195 03/04/2022 1307   TRIG 36 03/04/2022 1307   HDL 42 03/04/2022 1307   CHOLHDL 4.6 03/04/2022 1307   VLDL 7 03/04/2022 1307   LDLCALC 146 (H) 03/04/2022  1307    PHYSICAL EXAM:    VS:  BP (!) 148/80 (BP Location: Left Arm, Patient Position: Sitting, Cuff Size: Large)   Pulse 69   Ht '5\' 3"'$  (1.6 m)   Wt 202 lb 12.8 oz (92 kg)   SpO2 98%   BMI 35.92 kg/m   BMI: Body mass index is 35.92 kg/m.  Physical Exam Constitutional:      Appearance: She is well-developed.  HENT:     Head: Normocephalic and atraumatic.  Eyes:     General:        Right eye: No discharge.        Left eye: No discharge.  Neck:     Vascular: No JVD.  Cardiovascular:     Rate and Rhythm: Normal rate and regular rhythm.     Heart sounds: Normal heart sounds, S1 normal and S2 normal. Heart sounds not distant. No midsystolic click and no opening snap. No murmur heard.    No friction rub.  Pulmonary:     Effort: Pulmonary effort is normal. No respiratory distress.     Breath sounds: Normal breath sounds. No decreased breath sounds, wheezing or rales.  Chest:     Chest wall: No tenderness.  Abdominal:     General: There is no distension.     Palpations: Abdomen is soft.     Tenderness: There is no abdominal tenderness.  Musculoskeletal:     Cervical back: Normal range of motion.     Comments: Trivial bilateral nonpitting pretibial edema.  Skin:    General: Skin is warm and dry.     Nails: There is no clubbing.  Neurological:     Mental Status: She is alert and oriented to person, place, and time.  Psychiatric:        Speech:  Speech normal.        Behavior: Behavior normal.        Thought Content: Thought content normal.        Judgment: Judgment normal.     Wt Readings from Last 3 Encounters:  08/25/22 202 lb 12.8 oz (92 kg)  06/07/22 200 lb 6.4 oz (90.9 kg)  03/15/22 198 lb (89.8 kg)     ASSESSMENT & PLAN:   Lower extremity swelling: Suspect there is a component of dependent edema with venous insufficiency potentially.  Recent echo showed preserved LV systolic function with normal diastolic function.  We will initiate a trial of Lasix 20 mg daily  as needed with KCl 10 mEq to be taken with furosemide.  I have advised her to contact our office for a follow-up BMP if she is taking Lasix 2 to 3 days/week.  Ideally, would like to minimize loop diuretic usage.  Questionable history of A-fib: She has had approximately 4-5 episodes of palpitations since December 2023, though has not been able to capture these with her wearable device.  Case was discussed with EP prior to appointment.  She remains on apixaban 5 mg twice daily given prior reported history of A-fib along with metoprolol.  PSVT: Metoprolol as outlined above.  HTN: Blood pressure is mildly elevated in the office today.  Likely in the setting of increased stress.  Continue metoprolol with addition of as needed furosemide as outlined above.   Disposition: F/u with Dr. Saunders Revel or an APP in 3 months, and EP as directed.   Medication Adjustments/Labs and Tests Ordered: Current medicines are reviewed at length with the patient today.  Concerns regarding medicines are outlined above. Medication changes, Labs and Tests ordered today are summarized above and listed in the Patient Instructions accessible in Encounters.   Signed, Erica Faith, PA-C 08/25/2022 11:31 AM     [address] [place], [place] [address] [phone]

## 2022-08-25 ENCOUNTER — Ambulatory Visit: Payer: Medicare Other | Attending: Physician Assistant | Admitting: Physician Assistant

## 2022-08-25 ENCOUNTER — Encounter: Payer: Self-pay | Admitting: Physician Assistant

## 2022-08-25 VITALS — BP 148/80 | HR 69 | Ht 63.0 in | Wt 202.8 lb

## 2022-08-25 DIAGNOSIS — M7989 Other specified soft tissue disorders: Secondary | ICD-10-CM | POA: Insufficient documentation

## 2022-08-25 DIAGNOSIS — I480 Paroxysmal atrial fibrillation: Secondary | ICD-10-CM | POA: Diagnosis present

## 2022-08-25 DIAGNOSIS — I10 Essential (primary) hypertension: Secondary | ICD-10-CM | POA: Diagnosis present

## 2022-08-25 DIAGNOSIS — R002 Palpitations: Secondary | ICD-10-CM | POA: Insufficient documentation

## 2022-08-25 DIAGNOSIS — I471 Supraventricular tachycardia, unspecified: Secondary | ICD-10-CM | POA: Insufficient documentation

## 2022-08-25 MED ORDER — FUROSEMIDE 20 MG PO TABS: 20.0000 mg | ORAL_TABLET | ORAL | 3 refills | Status: DC | PRN

## 2022-08-25 MED ORDER — POTASSIUM CHLORIDE CRYS ER 20 MEQ PO TBCR: 20.0000 meq | EXTENDED_RELEASE_TABLET | ORAL | 3 refills | Status: AC | PRN

## 2022-08-25 NOTE — Patient Instructions (Signed)
Medication Instructions:  Your physician has recommended you make the following change in your medication:   AS NEEDED Furosemide 20 mg for weight gain or shortness of breathe AS NEEDED Potassium 20 mEq when you take Furosemide.   *If you need a refill on your cardiac medications before your next appointment, please call your pharmacy*   Lab Work: None  If you have labs (blood work) drawn today and your tests are completely normal, you will receive your results only by: [facility] (if you have MyChart) OR A paper copy in the mail If you have any lab test that is abnormal or we need to change your treatment, we will call you to review the results.   Testing/Procedures: None   Follow-Up: At [facility], you and your health needs are our priority.  As part of our continuing mission to provide you with exceptional heart care, we have created designated Provider Care Teams.  These Care Teams include your primary Cardiologist (physician) and Advanced Practice Providers (APPs -  Physician Assistants and Nurse Practitioners) who all work together to provide you with the care you need, when you need it.   Your next appointment:   3 month(s)  Provider:   [name], MD or Christell Faith, PA-C

## 2022-09-08 ENCOUNTER — Telehealth: Payer: Self-pay | Admitting: Internal Medicine

## 2022-09-08 DIAGNOSIS — I4891 Unspecified atrial fibrillation: Secondary | ICD-10-CM

## 2022-09-08 MED ORDER — APIXABAN 5 MG PO TABS: 5.0000 mg | ORAL_TABLET | Freq: Two times a day (BID) | ORAL | 1 refills | Status: DC

## 2022-09-08 NOTE — Telephone Encounter (Signed)
*  STAT* If patient is at the pharmacy, call can be transferred to refill team.   1. Which medications need to be refilled? (please list name of each medication and dose if known)  apixaban (ELIQUIS) 5 MG TABS tablet  2. Which pharmacy/location (including street and city if local pharmacy) is medication to be sent to? [place], [place]    3. Do they need a 30 day or 90 day supply?  90 day supply

## 2022-09-08 NOTE — Telephone Encounter (Signed)
Please assist patient with medication refill.  Thank you 

## 2022-09-08 NOTE — Telephone Encounter (Signed)
Eliquis 5mg  refill request received. Patient is 67 years old, weight-92kg, Crea-0.82 on 03/04/22, Diagnosis-Afib, and last seen by on . Dose is appropriate based on dosing criteria. Will send in refill to requested pharmacy.

## 2022-12-07 NOTE — Progress Notes (Signed)
Cardiology Office Note    Date:  12/08/2022   ID:  Erica Oneill, DOB 12-25-1955, MRN [id number]  PCP:  Hadley Pen, MD  Cardiologist:  Yvonne Kendall, MD  Electrophysiologist:  Lanier Prude, MD   Chief Complaint: Follow up  History of Present Illness:   Erica Oneill is a 67 y.o. female with history of normal coronary arteries by coronary CTA, questionable history of A-fib on apixaban, pericarditis, PSVT, syncope, and HTN who presents for follow up of palpitations.   She was previously followed by Dr. Tomie China though has subsequently transitioned her care to Dr. Okey Dupre.  She was admitted to the hospital in 06/2018 for acute pericarditis.  Echo at that time demonstrated an EF of 55 to 60%, no regional wall motion abnormalities, and a small pericardial effusion circumferential to the heart without evidence of hemodynamic compromise.  Zio patch in 03/2019 showed a predominant rhythm of sinus with an average rate of 79 bpm (range 50 to to 119 bpm), 39 second atrial run with a maximum rate of 169 bpm and occasional PVCs.  Repeat echo in 04/2019, to evaluate pericardial effusion, demonstrated an EF of 60 to 65%, borderline LVH, grade 1 diastolic dysfunction, normal RV systolic function and ventricular cavity size, no significant valvular abnormalities, normal PASP, and no evidence of pericardial effusion.  Echo in 01/2020, to evaluate incidentally reported cardiomegaly on outside chest radiograph, showed an EF of 60 to 65%, no regional wall motion abnormalities, grade 1 diastolic dysfunction, normal RV systolic function, ventricular cavity size, and PASP.  There was no significant pericardial effusion to suggest recurrent pericarditis.  Repeat Zio patch in 09/2020 showed a predominant rhythm of sinus with an average rate of 78 bpm (range 46 to 128 bpm in sinus), 2 atrial runs lasting up to 13.3 seconds with a maximum rate of 169 bpm, rare PACs and PVCs, no sustained arrhythmias or prolonged  pauses.  Patient triggered events corresponded to sinus rhythm, PACs, PVCs, and 1 episode of PSVT.  Coronary CTA in 10/2020 showed no significant noncardiac abnormalities within the visualized portion of the thoracic cavity.  Cardiac over read showed a calcium score of 0 with no evidence of CAD and normal sized aorta.   She was seen in late 01/2022 for an episode of elevated heart rate and palpitations.  EMS EKG was most consistent with a long RP tachycardia.  In this setting, she was started on Bystolic, though this led to headaches.  Given this, she presented to the [facility] ED in 02/2022 with chest pain and palpitations.  High-sensitivity troponin negative x 3.  BNP 49.  She was told the EKG showed new onset A-fib with RVR with ventricular to 151 (however upon outpatient cardiology review this EKG was consistent with SVT and not A-fib).  She spontaneously converted to sinus rhythm with Bystolic and Cardizem and was placed on apixaban.  Echo showed an EF of 55 to 60%, no regional wall motion abnormalities, normal LV diastolic function parameters, normal RV systolic function and ventricular cavity size, normal PASP, mild tricuspid regurgitation, and an estimated right atrial pressure of 3 mmHg.  At discharge, her Bystolic was transitioned to metoprolol and she was initiated on apixaban.   However, upon following up with cardiology on 03/08/2022, the only available EKG for review demonstrated tachycardia more consistent with SVT.  Telemetry was unable to be reviewed.  At that visit, she noted her heart rate would jump up quickly whenever she ambulated.  She also  reported some spontaneous episodes of tachycardia.  She did not feel like the metoprolol was helping her much.  She also noted her blood pressure trended up since being transition from Bystolic to metoprolol.  She was also worried upcoming dental work/sedation could precipitate tachycardia.  Given ongoing palpitations and elevated BP, Lopressor was titrated to  25 mg twice daily.  She was referred to EP for further recommendations regarding her PSVT and questionable history of A-fib.  She was subsequently evaluated by Dr. Lalla Brothers with EP on 03/15/2022 with noted unclear diagnosis of PSVT felt to possibly be related to atrial tachycardia, versus AVRT versus atypical AVNRT.  There was no evidence of flutter waves or A-fib.  She was transitioned to Toprol-XL 50 mg twice daily.  EP did not see clear evidence of A-fib on available EKG or telemetry strip.  Placing a loop recorder was complicated due to a documented history of lidocaine causing anaphylaxis.  It was unclear if she had ever received any alternatives such as bupivacaine.  In this setting, she was referred to an allergist to weigh in regarding local anesthesia options.  She most recently followed up with the EP in 05/2022, she had not yet gone to the allergist and indicated she was not planning to go.  She was under increased stress with helping with her mother.  She reported brief palpitations that she had been unable to capture on her [employer] mobile device or [employer].  There was still no documented evidence of A-fib.  In the absence of being unable to capture these episodes, she was continued on apixaban with recommendation to discontinue anticoagulation if these captured episodes did not demonstrate evidence of A-fib.  EP follow-up was recommended on a as needed basis.   She was evaluated in 08/2022 for a 2-week history of lower extremity, hand, and facial swelling.  She was without progressive dyspnea or orthopnea.  Her weight was up 2 pounds when compared to her visit in 05/2022.  She was eating out at restaurants 2-3 times per week.  Swelling was suspected to be dependent in etiology with venous insufficiency.  She was started on a trial of Lasix 20 mg as needed.  She comes in today noting 1 episode of tachypalpitations since her last visit that lasted for approximately 25 minutes.  She wonders if this was  in the setting of missing a dose of metoprolol versus having gotten an updated prescription of metoprolol that was from a different manufacturer.  Upon contacting her pharmacy and having a new batch of metoprolol dispensed she has not had any further tachypalpitations.  Since she was last seen, she also reports her mother has passed away.  She reports intermittent left lateral wrist discomfort that is reproducible to palpation as well as left-sided upper chest discomfort that is reproducible with palpation with palpation causing discomfort to radiate towards the left shoulder.  She does feel like she has some chest tightness with ambulation as well.  No progressive dyspnea or orthopnea.  Blood pressure remains in the 140s over 80s XL.   Labs independently reviewed: 10/2022 - BNP < 50, potassium 4.1, BUN 14, serum creatinine 0.99, BUN 4.0 AST 47, ALT normal 02/2022 - TC 195, TG 36, HDL 42, LDL 146, magnesium 2.1, A1c 5.0, Hgb 14.9, PLT 200 01/2022 - TSH normal  Past Medical History:  Diagnosis Date   Acute cystitis with hematuria 12/13/2018   Arthralgia of multiple sites 08/28/2018   Chronic diastolic heart failure (HCC)  COVID-19 12/2019   Essential hypertension 09/03/2018   Febrile illness 08/28/2018   Hematuria 08/28/2018   Hypertension    Long-term use of aspirin therapy 08/06/2018   Palpitations 09/03/2018   Paroxysmal atrial fibrillation (HCC) 02/2022   Pericarditis 07/20/2018   PSVT (paroxysmal supraventricular tachycardia) 09/03/2018   Rectal bleeding 08/28/2018   Situational anxiety 08/28/2018   Syncope 07/20/2018    Past Surgical History:  Procedure Laterality Date   ABDOMINAL HYSTERECTOMY     CATARACT EXTRACTION W/PHACO Left 08/27/2019   Procedure: CATARACT EXTRACTION PHACO AND INTRAOCULAR LENS PLACEMENT (IOC) LEFT;  Surgeon: Lockie Mola, MD;  Location: [facility];  Service: Ophthalmology;  Laterality: Left;  6.08 0:53.6 11.4%   CATARACT EXTRACTION W/PHACO  Right 09/17/2019   Procedure: CATARACT EXTRACTION PHACO AND INTRAOCULAR LENS PLACEMENT (IOC) RIGHT;  Surgeon: Lockie Mola, MD;  Location: [facility];  Service: Ophthalmology;  Laterality: Right;  4.83 0:42.7 11.3%   TORN MENISCUS IN LEFT KNEE Left     Current Medications: Current Meds  Medication Sig   acetaminophen (TYLENOL) 500 MG tablet Take 500 mg by mouth every 6 (six) hours as needed for moderate pain.   potassium chloride SA (KLOR-CON M) 20 MEQ tablet Take 1 tablet (20 mEq total) by mouth as needed (TAKE with Furosemide).   valsartan (DIOVAN) 80 MG tablet Take 1 tablet (80 mg total) by mouth daily.   [DISCONTINUED] apixaban (ELIQUIS) 5 MG TABS tablet Take 1 tablet (5 mg total) by mouth 2 (two) times daily.   [DISCONTINUED] metoprolol succinate (TOPROL-XL) 50 MG 24 hr tablet Take 1 tablet (50 mg total) by mouth in the morning and at bedtime. Take with or immediately following a meal.    Allergies:   Doxycycline, Lidocaine, Rocephin [ceftriaxone], Fentanyl, Ibuprofen, Meloxicam, Morphine and codeine, Sulfamethoxazole-trimethoprim, Valsartan, and Other   Social History   Socioeconomic History   Marital status: Married    Spouse name: Not on file   Number of children: Not on file   Years of education: Not on file   Highest education level: Not on file  Occupational History   Not on file  Tobacco Use   Smoking status: Never   Smokeless tobacco: Never  Vaping Use   Vaping Use: Never used  Substance and Sexual Activity   Alcohol use: Never   Drug use: Never   Sexual activity: Not on file  Other Topics Concern   Not on file  Social History Narrative   Not on file   Social Determinants of Health   Financial Resource Strain: Not on file  Food Insecurity: Not on file  Transportation Needs: Not on file  Physical Activity: Not on file  Stress: Not on file  Social Connections: Not on file     Family History:  The patient's family history includes Cancer  in her father; Heart attack (age of onset: 66) in her maternal grandfather; Heart attack (age of onset: 59) in her mother; Heart disease in her mother; Hypertension in her mother.  ROS:   12-point review of systems is negative unless otherwise noted in the HPI.   EKGs/Labs/Other Studies Reviewed:    Studies reviewed were summarized above. The additional studies were reviewed today:  2D echo 03/04/2022: 1. Left ventricular ejection fraction, by estimation, is 55 to 60%. The  left ventricle has normal function. The left ventricle has no regional  wall motion abnormalities. Left ventricular diastolic parameters were  normal.   2. Right ventricular systolic function is normal. The right ventricular  size is normal. There is normal pulmonary artery systolic pressure.   3. The mitral valve is normal in structure. No evidence of mitral valve  regurgitation. No evidence of mitral stenosis.   4. The tricuspid valve is abnormal.   5. The aortic valve is tricuspid. Aortic valve regurgitation is not  visualized. No aortic stenosis is present.   6. The inferior vena cava is normal in size with greater than 50%  respiratory variability, suggesting right atrial pressure of 3 mmHg.  __________   Coronary CTA 10/28/2020: Aorta:  Normal size.  No calcifications.  No dissection.   Aortic Valve:  Trileaflet.  No calcifications.   Coronary Arteries:  Normal coronary origin.  Right dominance.   RCA is a dominant artery that gives rise to PDA and PLA. There is no plaque.   Left main gives rise to LAD and LCX arteries. There is no LM disease.   LAD has no plaque.   LCX is a non-dominant artery that gives rise to two obtuse marginal branches. There is no plaque.   Other findings:   Normal pulmonary vein drainage into the left atrium.   Normal left atrial appendage without a thrombus.   Normal size of the pulmonary artery.   IMPRESSION: 1. Normal coronary calcium score of 0. Patient is low  risk for coronary events. 2. Normal coronary origin with right dominance. 3. No evidence of CAD. 4. CAD-RADS 0. Consider non-atherosclerotic causes of chest pain. __________   Luci Bank patch 09/2020: The patient was monitored for 12 days, 19 hours. The predominant rhythm was sinus with an average rate of 78 bpm (range 46-128 bpm and sinus). There were rare PACs and PVCs. Two atrial runs lasting up to 13.3 seconds with a maximum rate of 169 bpm were observed. No sustained arrhythmia or prolonged pause occurred. Patient triggered events correspond to sinus rhythm, PACs, PVCs, and one episode of PSVT.   Predominantly sinus rhythm with rare PACs and PVCs as well as 2 episodes of PSVT lasting up to 13 seconds. __________   2D echo 02/12/2020: 1. Left ventricular ejection fraction, by estimation, is 60 to 65%. The  left ventricle has normal function. The left ventricle has no regional  wall motion abnormalities. Left ventricular diastolic parameters are  consistent with Grade I diastolic  dysfunction (impaired relaxation).   2. Right ventricular systolic function is normal. The right ventricular  size is normal. There is normal pulmonary artery systolic pressure.  __________   2D echo 05/05/2019: 1. Left ventricular ejection fraction, by visual estimation, is 60 to  65%. The left ventricle has normal function. There is borderline left  ventricular hypertrophy.   2. Left ventricular diastolic parameters are consistent with Grade I  diastolic dysfunction (impaired relaxation).   3. Global right ventricle has normal systolic function.The right  ventricular size is normal. No increase in right ventricular wall  thickness.   4. Left atrial size was normal.   5. Right atrial size was normal.   6. The mitral valve is normal in structure. No evidence of mitral valve  regurgitation. No evidence of mitral stenosis.   7. The tricuspid valve is normal in structure. Tricuspid valve  regurgitation is  not demonstrated.   8. The aortic valve is normal in structure. Aortic valve regurgitation is  not visualized. No evidence of aortic valve sclerosis or stenosis.   9. The pulmonic valve was normal in structure. Pulmonic valve  regurgitation is not visualized.  10. Normal pulmonary artery systolic pressure.  11. The inferior vena cava is normal in size with greater than 50%  respiratory variability, suggesting right atrial pressure of 3 mmHg.  __________   Luci Bank patch 03/2019: Baseline rhythm: Sinus   Minimum heart rate: 52 BPM.  Average heart rate: 79 BPM.  Maximal heart rate 119 BPM.  And 169 was the maximum heart rate during the 39-second atrial run   Atrial arrhythmia: 39-second atrial run with maximum heart rate of 169.  Asymptomatic   Ventricular arrhythmia: Occasional PVCs   Conduction abnormality: None significant   Symptoms: None significant     Conclusion:  Mildly abnormal but largely unremarkable event monitor.  Patient had a brief asymptomatic atrial run as mentioned above. __________   2D echo 07/22/2018: - Left ventricle: The cavity size was normal. Systolic function was    normal. The estimated ejection fraction was in the range of 55%    to 60%. Wall motion was normal; there were no regional wall    motion abnormalities.  - Pericardium, extracardiac: A small pericardial effusion was    identified circumferential to the heart. There was no evidence of    hemodynamic compromise.   Impressions:   - There is no visual evidence of hemodynamic compromise as it    pertains to small pericardial effusion.    EKG:  EKG is ordered today.  The EKG ordered today demonstrates NSR, 64 bpm, nonspecific ST-T changes  Recent Labs: 02/22/2022: TSH 3.122 03/03/2022: Hemoglobin 14.9; Platelets 200 03/04/2022: ALT 44; B Natriuretic Peptide 49.9; BUN 16; Creatinine, Ser 0.82; Magnesium 2.1; Potassium 3.9; Sodium 141  Recent Lipid Panel    Component Value Date/Time   CHOL 195  03/04/2022 1307   TRIG 36 03/04/2022 1307   HDL 42 03/04/2022 1307   CHOLHDL 4.6 03/04/2022 1307   VLDL 7 03/04/2022 1307   LDLCALC 146 (H) 03/04/2022 1307    PHYSICAL EXAM:    VS:  BP (!) 142/80   Pulse 64   Ht 5\' 5"  (1.651 m)   Wt 203 lb 12.8 oz (92.4 kg)   BMI 33.91 kg/m   BMI: Body mass index is 33.91 kg/m.  Physical Exam Vitals reviewed.  Constitutional:      Appearance: She is well-developed.  HENT:     Head: Normocephalic and atraumatic.  Eyes:     General:        Right eye: No discharge.        Left eye: No discharge.  Neck:     Vascular: No JVD.  Cardiovascular:     Rate and Rhythm: Normal rate and regular rhythm.     Heart sounds: Normal heart sounds, S1 normal and S2 normal. Heart sounds not distant. No midsystolic click and no opening snap. No murmur heard.    No friction rub.  Pulmonary:     Effort: Pulmonary effort is normal. No respiratory distress.     Breath sounds: Normal breath sounds. No decreased breath sounds, wheezing or rales.  Chest:     Chest wall: No tenderness.  Abdominal:     General: There is no distension.  Musculoskeletal:     Cervical back: Normal range of motion.  Skin:    General: Skin is warm and dry.     Nails: There is no clubbing.  Neurological:     Mental Status: She is alert and oriented to person, place, and time.  Psychiatric:        Speech: Speech normal.        Behavior:  Behavior normal.        Thought Content: Thought content normal.        Judgment: Judgment normal.     Wt Readings from Last 3 Encounters:  12/08/22 203 lb 12.8 oz (92.4 kg)  08/25/22 202 lb 12.8 oz (92 kg)  06/07/22 200 lb 6.4 oz (90.9 kg)     ASSESSMENT & PLAN:   Chest pain: Overall symptoms are atypical and reproducible by palpation.  However, she does notice some exertional component to her chest discomfort as well.  Coronary CTA from 2022 showed a calcium score of 0 with no evidence of CAD.  Schedule Lexiscan MPI to evaluate for high  risk ischemia.  No rub noted on exam with recent echo from less than 1 year ago showing no evidence of pericardial effusion.  Questionable history of A-fib: Started on anticoagulation during prior hospitalization for reported A-fib.  Details of this are unclear.  She has been unable to capture episodes of palpitations with her wearable device.  Episodes are infrequent, making Zio patch less ideal.  EP has been unable to proceed with ILR given patient's history of anaphylaxis associated with lidocaine.  Case has previously been discussed with EP with recommendation to maintain the patient on apixaban 5 mg twice daily given prior reported history of A-fib, along with metoprolol.  No falls or symptoms concerning for bleeding.  PSVT: She has had 1 episode of tachypalpitations lasting for approximately 25 minutes since her last visit.  She does wonder if this was in the setting of missing a dose of metoprolol or in the setting of her pharmacy using a different manufacturer.  She now has a new batch of metoprolol pills and has been asymptomatic.  Remains on Toprol-XL 50 mg twice daily.  HTN: Blood pressure remains elevated in the 140s over 80s.  We discussed transitioning to carvedilol, she does express some concerns that this may not control her palpitations quite as well.  Given this, we elected to start valsartan 80 mg daily with continuation of Toprol-XL 50 mg twice daily.  Follow-up BMP 1 week after initiating ARB (this can be drawn when she comes in for her Lexiscan MPI).  Lower extremity swelling: No swelling noted on exam today.  No evidence of cardiac decompensation.  BNP normal.  Recommend leg elevation and compression socks.  Remains on as needed furosemide for weight gain/shortness of breath.  Would not aggressively diurese.   Informed Consent   Shared Decision Making/Informed Consent{  The risks [chest pain, shortness of breath, cardiac arrhythmias, dizziness, blood pressure fluctuations,  myocardial infarction, stroke/transient ischemic attack, nausea, vomiting, allergic reaction, radiation exposure, metallic taste sensation and life-threatening complications (estimated to be 1 in 10,000)], benefits (risk stratification, diagnosing coronary artery disease, treatment guidance) and alternatives of a nuclear stress test were discussed in detail with Ms. Bronn and she agrees to proceed.        Disposition: F/u with Dr. Okey Dupre or an APP in 1 month, and EP as directed.   Medication Adjustments/Labs and Tests Ordered: Current medicines are reviewed at length with the patient today.  Concerns regarding medicines are outlined above. Medication changes, Labs and Tests ordered today are summarized above and listed in the Patient Instructions accessible in Encounters.   Signed, Eula Listen, PA-C 12/08/2022 10:25 AM      [facility] [address] [place], [place] [address] [phone]

## 2022-12-08 ENCOUNTER — Encounter: Payer: Self-pay | Admitting: Physician Assistant

## 2022-12-08 ENCOUNTER — Ambulatory Visit: Payer: Medicare Other | Attending: Physician Assistant | Admitting: Physician Assistant

## 2022-12-08 VITALS — BP 142/80 | HR 64 | Ht 65.0 in | Wt 203.8 lb

## 2022-12-08 DIAGNOSIS — I471 Supraventricular tachycardia, unspecified: Secondary | ICD-10-CM | POA: Diagnosis not present

## 2022-12-08 DIAGNOSIS — M7989 Other specified soft tissue disorders: Secondary | ICD-10-CM | POA: Diagnosis present

## 2022-12-08 DIAGNOSIS — I4891 Unspecified atrial fibrillation: Secondary | ICD-10-CM | POA: Diagnosis present

## 2022-12-08 DIAGNOSIS — I10 Essential (primary) hypertension: Secondary | ICD-10-CM | POA: Diagnosis present

## 2022-12-08 DIAGNOSIS — R002 Palpitations: Secondary | ICD-10-CM

## 2022-12-08 DIAGNOSIS — R072 Precordial pain: Secondary | ICD-10-CM | POA: Diagnosis present

## 2022-12-08 MED ORDER — APIXABAN 5 MG PO TABS: 5.0000 mg | ORAL_TABLET | Freq: Two times a day (BID) | ORAL | 1 refills | Status: DC

## 2022-12-08 MED ORDER — VALSARTAN 80 MG PO TABS
80.0000 mg | ORAL_TABLET | Freq: Every day | ORAL | 3 refills | Status: DC
Start: 2022-12-08 — End: 2023-01-12

## 2022-12-08 MED ORDER — METOPROLOL SUCCINATE ER 50 MG PO TB24: 50.0000 mg | ORAL_TABLET | Freq: Two times a day (BID) | ORAL | 3 refills | Status: DC

## 2022-12-08 NOTE — Patient Instructions (Addendum)
Medication Instructions:  Your physician has recommended you make the following change in your medication:   START Valsartan 80 mg once daily   *If you need a refill on your cardiac medications before your next appointment, please call your pharmacy*   Lab Work: BMET & Mag to be done the morning of your stress test.   If you have labs (blood work) drawn today and your tests are completely normal, you will receive your results only by: MyChart Message (if you have MyChart) OR A paper copy in the mail If you have any lab test that is abnormal or we need to change your treatment, we will call you to review the results.   Testing/Procedures: [facility] MYOVIEW  Your Provider has ordered a Stress Test with nuclear imaging for you to have done in 1 week. The purpose of this test is to evaluate the blood supply to your heart muscle. This procedure is referred to as a "Non-Invasive Stress Test." This is because other than having an IV started in your vein, nothing is inserted or "invades" your body. Cardiac stress tests are done to find areas of poor blood flow to the heart by determining the extent of coronary artery disease (CAD). Some patients exercise on a treadmill, which naturally increases the blood flow to your heart, while others who are unable to walk on a treadmill due to physical limitations have a pharmacologic/chemical stress agent called Lexiscan. This medicine will mimic walking on a treadmill by temporarily increasing your coronary blood flow.     REPORT TO [facility] MEDICAL MALL ENTRANCE  **Proceed to the 1st desk on the right, REGISTRATION, to check in**  Please note: this test may take anywhere between 2-4 hours to complete    Instructions regarding medication:   _XX__:   You may take all of your regular morning medications the day of your test.    How to prepare for your Myoview test:  Do not eat or drink for 6 hours prior to the test No caffeine for 24 hours prior to the  test No smoking 24 hours prior to the test. Ladies, please do not wear dresses.  Skirts or pants are appropriate. Please wear a short sleeve shirt. No perfume, cologne or lotion. Wear comfortable walking shoes. No heels!   PLEASE NOTIFY THE OFFICE AT LEAST 24 HOURS IN ADVANCE IF YOU ARE UNABLE TO KEEP YOUR APPOINTMENT.  [phone] AND  PLEASE NOTIFY NUCLEAR MEDICINE AT [facility] AT LEAST 24 HOURS IN ADVANCE IF YOU ARE UNABLE TO KEEP YOUR APPOINTMENT. [phone]       Follow-Up: At [facility], you and your health needs are our priority.  As part of our continuing mission to provide you with exceptional heart care, we have created designated Provider Care Teams.  These Care Teams include your primary Cardiologist (physician) and Advanced Practice Providers (APPs -  Physician Assistants and Nurse Practitioners) who all work together to provide you with the care you need, when you need it.   Your next appointment:   1 month(s)  Provider:   Yvonne Kendall, MD or Eula Listen, PA-C

## 2022-12-14 ENCOUNTER — Telehealth: Payer: Self-pay | Admitting: Physician Assistant

## 2022-12-14 NOTE — Telephone Encounter (Signed)
The patient has been advised about instructions for the [facility].  The patient also stated that she cannot take the Valsartan because it makes her feel faint. She has been advised to check her blood pressure 1-2 hours after she has taken her medication but stated that she does not want to try it due to how she felt in the past.

## 2022-12-14 NOTE — Telephone Encounter (Signed)
Noted, we can readdress her blood pressure when she is seen in follow-up.

## 2022-12-14 NOTE — Telephone Encounter (Signed)
Left a message for the patient to call back.  

## 2022-12-14 NOTE — Telephone Encounter (Signed)
Spoke with patient and reviewed recommendations. She verbalized understanding with no further questions at this time.  

## 2022-12-14 NOTE — Telephone Encounter (Signed)
New Message:       Patient is scheduled for a Stress Test tomorrow. She wants to know if she is supposed to take her Metoprolol and  Eliquis?

## 2022-12-15 ENCOUNTER — Encounter
Admission: RE | Admit: 2022-12-15 | Discharge: 2022-12-15 | Disposition: A | Discharge status: Home / Self Care | Payer: Medicare Other | Source: Ambulatory Visit | Attending: Physician Assistant | Admitting: Physician Assistant

## 2022-12-15 ENCOUNTER — Other Ambulatory Visit
Admission: RE | Admit: 2022-12-15 | Discharge: 2022-12-15 | Disposition: A | Discharge status: Home / Self Care | Payer: Medicare Other | Source: Home / Self Care | Attending: Physician Assistant | Admitting: Physician Assistant

## 2022-12-15 DIAGNOSIS — R002 Palpitations: Secondary | ICD-10-CM | POA: Diagnosis present

## 2022-12-15 DIAGNOSIS — I4891 Unspecified atrial fibrillation: Secondary | ICD-10-CM

## 2022-12-15 DIAGNOSIS — R072 Precordial pain: Secondary | ICD-10-CM

## 2022-12-15 LAB — NM MYOCAR MULTI W/SPECT W/WALL MOTION / EF
LV dias vol: 58 mL (ref 46–106)
LV sys vol: 18 mL
Nuc Stress EF: 69 %
Peak HR: 90 {beats}/min
Percent HR: 59 %
Rest HR: 63 {beats}/min
Rest Nuclear Isotope Dose: 9.9 mCi
SDS: 2
SRS: 1
SSS: 0
ST Depression (mm): 0 mm
Stress Nuclear Isotope Dose: 32.3 mCi
TID: 0.98

## 2022-12-15 LAB — BASIC METABOLIC PANEL
Anion gap: 8 (ref 5–15)
BUN: 14 mg/dL (ref 8–23)
CO2: 24 mmol/L (ref 22–32)
Calcium: 9.3 mg/dL (ref 8.9–10.3)
Chloride: 105 mmol/L (ref 98–111)
Creatinine, Ser: 0.8 mg/dL (ref 0.44–1.00)
GFR, Estimated: >60 mL/min (ref >60)
Glucose, Bld: 101 mg/dL — ABNORMAL HIGH (ref 70–99)
Potassium: 4 mmol/L (ref 3.5–5.1)
Sodium: 137 mmol/L (ref 135–145)

## 2022-12-15 LAB — MAGNESIUM: Magnesium: 2.2 mg/dL (ref 1.7–2.4)

## 2022-12-15 MED ORDER — TECHNETIUM TC 99M TETROFOSMIN IV KIT
30.0000 | PACK | Freq: Once | INTRAVENOUS | Status: AC | PRN
  Administered 2022-12-15: 32.25 via INTRAVENOUS

## 2022-12-15 MED ORDER — REGADENOSON 0.4 MG/5ML IV SOLN
0.4000 mg | Freq: Once | INTRAVENOUS | Status: AC
  Administered 2022-12-15: 0.4 mg via INTRAVENOUS
  Filled 2022-12-15: qty 5

## 2022-12-15 MED ORDER — TECHNETIUM TC 99M TETROFOSMIN IV KIT
10.0000 | PACK | Freq: Once | INTRAVENOUS | Status: AC | PRN
Start: 2022-12-15 — End: 2022-12-15
  Administered 2022-12-15: 9.86 via INTRAVENOUS

## 2022-12-19 ENCOUNTER — Other Ambulatory Visit: Payer: Self-pay | Admitting: *Deleted

## 2022-12-19 DIAGNOSIS — R072 Precordial pain: Secondary | ICD-10-CM

## 2022-12-19 DIAGNOSIS — I480 Paroxysmal atrial fibrillation: Secondary | ICD-10-CM

## 2022-12-19 DIAGNOSIS — I471 Supraventricular tachycardia, unspecified: Secondary | ICD-10-CM

## 2022-12-19 DIAGNOSIS — I4891 Unspecified atrial fibrillation: Secondary | ICD-10-CM

## 2022-12-19 DIAGNOSIS — R002 Palpitations: Secondary | ICD-10-CM

## 2022-12-19 DIAGNOSIS — R079 Chest pain, unspecified: Secondary | ICD-10-CM

## 2023-01-09 NOTE — Progress Notes (Signed)
Cardiology Office Note    Date:  01/12/2023   ID:  ZAKERA FIDDLER, DOB 02/17/1956, MRN [id number]  PCP:  Hadley Pen, MD  Cardiologist:  Yvonne Kendall, MD  Electrophysiologist:  Lanier Prude, MD   Chief Complaint: Follow-up  History of Present Illness:   Erica Oneill is a 67 y.o. female with history of normal coronary arteries by coronary CTA, questionable history of A-fib on apixaban, pericarditis, PSVT, syncope, and HTN who presents for follow up of Lexiscan MPI.   She was previously followed by Dr. Tomie China though has subsequently transitioned her care to Dr. Okey Dupre.  She was admitted to the hospital in 06/2018 for acute pericarditis.  Echo at that time demonstrated an EF of 55 to 60%, no regional wall motion abnormalities, and a small pericardial effusion circumferential to the heart without evidence of hemodynamic compromise.  Zio patch in 03/2019 showed a predominant rhythm of sinus with an average rate of 79 bpm (range 50 to to 119 bpm), 39 second atrial run with a maximum rate of 169 bpm and occasional PVCs.  Repeat echo in 04/2019, to evaluate pericardial effusion, demonstrated an EF of 60 to 65%, borderline LVH, grade 1 diastolic dysfunction, normal RV systolic function and ventricular cavity size, no significant valvular abnormalities, normal PASP, and no evidence of pericardial effusion.  Echo in 01/2020, to evaluate incidentally reported cardiomegaly on outside chest radiograph, showed an EF of 60 to 65%, no regional wall motion abnormalities, grade 1 diastolic dysfunction, normal RV systolic function, ventricular cavity size, and PASP.  There was no significant pericardial effusion to suggest recurrent pericarditis.  Repeat Zio patch in 09/2020 showed a predominant rhythm of sinus with an average rate of 78 bpm (range 46 to 128 bpm in sinus), 2 atrial runs lasting up to 13.3 seconds with a maximum rate of 169 bpm, rare PACs and PVCs, no sustained arrhythmias or prolonged  pauses.  Patient triggered events corresponded to sinus rhythm, PACs, PVCs, and 1 episode of PSVT.  Coronary CTA in 10/2020 showed no significant noncardiac abnormalities within the visualized portion of the thoracic cavity.  Cardiac over read showed a calcium score of 0 with no evidence of CAD and normal sized aorta.   She was seen in late 01/2022 for an episode of elevated heart rate and palpitations.  EMS EKG was most consistent with a long RP tachycardia.  In this setting, she was started on Bystolic, though this led to headaches.  Given this, she presented to the [facility] ED in 02/2022 with chest pain and palpitations.  High-sensitivity troponin negative x 3.  BNP 49.  She was told the EKG showed new onset A-fib with RVR with ventricular to 151 (however upon outpatient cardiology review this EKG was consistent with SVT and not A-fib).  She spontaneously converted to sinus rhythm with Bystolic and Cardizem and was placed on apixaban.  Echo showed an EF of 55 to 60%, no regional wall motion abnormalities, normal LV diastolic function parameters, normal RV systolic function and ventricular cavity size, normal PASP, mild tricuspid regurgitation, and an estimated right atrial pressure of 3 mmHg.  At discharge, her Bystolic was transitioned to metoprolol and she was initiated on apixaban.   However, upon following up with cardiology on 03/08/2022, the only available EKG for review demonstrated tachycardia more consistent with SVT.  Telemetry was unable to be reviewed.  At that visit, she noted her heart rate would jump up quickly whenever she ambulated.  She also  reported some spontaneous episodes of tachycardia.  She did not feel like the metoprolol was helping her much.  She also noted her blood pressure trended up since being transition from Bystolic to metoprolol.  She was also worried upcoming dental work/sedation could precipitate tachycardia.  Given ongoing palpitations and elevated BP, Lopressor was titrated to  25 mg twice daily.  She was referred to EP for further recommendations regarding her PSVT and questionable history of A-fib.  She was subsequently evaluated by Dr. Lalla Brothers with EP on 03/15/2022 with noted unclear diagnosis of PSVT felt to possibly be related to atrial tachycardia, versus AVRT versus atypical AVNRT.  There was no evidence of flutter waves or A-fib.  She was transitioned to Toprol-XL 50 mg twice daily.  EP did not see clear evidence of A-fib on available EKG or telemetry strip.  Placing a loop recorder was complicated due to a documented history of lidocaine causing anaphylaxis.  It was unclear if she had ever received any alternatives such as bupivacaine.  In this setting, she was referred to an allergist to weigh in regarding local anesthesia options.  She followed up with the EP in 05/2022, had not yet gone to the allergist and indicated she was not planning to go.  She was under increased stress with helping with her mother.  She reported brief palpitations that she had been unable to capture on her [employer] mobile device or [employer].  There was still no documented evidence of A-fib.  In the absence of being unable to capture these episodes, she was continued on apixaban with recommendation to discontinue anticoagulation if these captured episodes did not demonstrate evidence of A-fib.  EP follow-up was recommended on a as needed basis.   She was evaluated in 08/2022 for a 2-week history of lower extremity, hand, and facial swelling.  She was without progressive dyspnea or orthopnea.  Her weight was up 2 pounds when compared to her visit in 05/2022.  She was eating out at restaurants 2-3 times per week.  Swelling was suspected to be dependent in etiology with venous insufficiency.  She was started on a trial of Lasix 20 mg as needed.  She was last seen in the office on 12/08/2022 noting 1 episode of tachypalpitations since her last visit that lasted for approximately 25 minutes.  She wondered if  this was related to missing a dose of metoprolol versus having gotten an updated prescription that was from a different manufacturer.  She contacted her pharmacy and requested a new of metoprolol and had not had any further palpitations.  She noted left lateral wrist discomfort that was reproducible with palpation as well as left-sided upper chest discomfort that was reproducible to palpation.  She was without exertional symptoms.  She was initiated on valsartan 80 mg due to elevated BP.  She underwent Lexiscan MPI on 12/15/2022 that showed no significant ischemia with an EF of 76% and was overall low risk.  CT attenuation corrected images showed no significant aortic atherosclerosis with possible ostial RCA calcification versus aortic valve sclerosis.  Follow-up lipid panel was recommended for further risk stratification and remains pending at this time.  She comes in today doing well from a cardiac perspective, and is without symptoms of angina or cardiac decompensation. Palpitation burden improved with using a consistent Pharmacologist. No dyspnea, lower extremity swelling, or progressive orthopnea. No hematochezia or melena. She did have one mechanical fall since her last visit in the garden. She did not hit her head  or suffer LOC. She did have some right flank pain following this with no acute fractures on imaging. She has not needed any as needed Lasix. Needing to undergo a single dental extraction. Not taking valsartan.    Labs independently reviewed: 12/2022 - Hgb 13.8, PLT 187 11/2022 - potassium 4.0, BUN 14, serum creatinine 0.8, magnesium 2.2 10/2022 - BNP < 50, albumin 4.0 AST 47, ALT normal 02/2022 - TC 195, TG 36, HDL 42, LDL 146, A1c 5.0 01/2022 - TSH normal  Past Medical History:  Diagnosis Date   Acute cystitis with hematuria 12/13/2018   Arthralgia of multiple sites 08/28/2018   Chronic diastolic heart failure (HCC)    COVID-19 12/2019   Essential hypertension 09/03/2018    Febrile illness 08/28/2018   Hematuria 08/28/2018   Hypertension    Long-term use of aspirin therapy 08/06/2018   Palpitations 09/03/2018   Paroxysmal atrial fibrillation (HCC) 02/2022   Pericarditis 07/20/2018   PSVT (paroxysmal supraventricular tachycardia) 09/03/2018   Rectal bleeding 08/28/2018   Situational anxiety 08/28/2018   Syncope 07/20/2018    Past Surgical History:  Procedure Laterality Date   ABDOMINAL HYSTERECTOMY     CATARACT EXTRACTION W/PHACO Left 08/27/2019   Procedure: CATARACT EXTRACTION PHACO AND INTRAOCULAR LENS PLACEMENT (IOC) LEFT;  Surgeon: Lockie Mola, MD;  Location: [facility];  Service: Ophthalmology;  Laterality: Left;  6.08 0:53.6 11.4%   CATARACT EXTRACTION W/PHACO Right 09/17/2019   Procedure: CATARACT EXTRACTION PHACO AND INTRAOCULAR LENS PLACEMENT (IOC) RIGHT;  Surgeon: Lockie Mola, MD;  Location: [facility];  Service: Ophthalmology;  Laterality: Right;  4.83 0:42.7 11.3%   TORN MENISCUS IN LEFT KNEE Left     Current Medications: Current Meds  Medication Sig   acetaminophen (TYLENOL) 500 MG tablet Take 500 mg by mouth every 6 (six) hours as needed for moderate pain.   apixaban (ELIQUIS) 5 MG TABS tablet Take 1 tablet (5 mg total) by mouth 2 (two) times daily.   furosemide (LASIX) 20 MG tablet Take 1 tablet (20 mg total) by mouth as needed (As needed for weight gain or shortness of breath).   metoprolol succinate (TOPROL-XL) 50 MG 24 hr tablet Take 1 tablet (50 mg total) by mouth in the morning and at bedtime. Take with or immediately following a meal.   potassium chloride SA (KLOR-CON M) 20 MEQ tablet Take 1 tablet (20 mEq total) by mouth as needed (TAKE with Furosemide).    Allergies:   Doxycycline, Lidocaine, Rocephin [ceftriaxone], Fentanyl, Ibuprofen, Meloxicam, Morphine and codeine, Sulfamethoxazole-trimethoprim, Valsartan, and Other   Social History   Socioeconomic History   Marital status: Married     Spouse name: Not on file   Number of children: Not on file   Years of education: Not on file   Highest education level: Not on file  Occupational History   Not on file  Tobacco Use   Smoking status: Never   Smokeless tobacco: Never  Vaping Use   Vaping status: Never Used  Substance and Sexual Activity   Alcohol use: Never   Drug use: Never   Sexual activity: Not on file  Other Topics Concern   Not on file  Social History Narrative   Not on file   Social Determinants of Health   Financial Resource Strain: Not on file  Food Insecurity: Not on file  Transportation Needs: Not on file  Physical Activity: Not on file  Stress: Not on file  Social Connections: Not on file     Family  History:  The patient's family history includes Cancer in her father; Heart attack (age of onset: 8) in her maternal grandfather; Heart attack (age of onset: 19) in her mother; Heart disease in her mother; Hypertension in her mother.  ROS:   12-point review of systems is negative unless otherwise noted in the HPI.   EKGs/Labs/Other Studies Reviewed:    Studies reviewed were summarized above. The additional studies were reviewed today:  Lexiscan MPI 12/15/2022: Pharmacological myocardial perfusion imaging study with no significant  ischemia Normal wall motion, EF estimated at 76% No EKG changes concerning for ischemia at peak stress or in recovery. CT attenuation correction images with no significant aortic atherosclerosis, unable to exclude ostial RCA disease versus aortic valve sclerosis Low risk scan __________  2D echo 03/04/2022: 1. Left ventricular ejection fraction, by estimation, is 55 to 60%. The  left ventricle has normal function. The left ventricle has no regional  wall motion abnormalities. Left ventricular diastolic parameters were  normal.   2. Right ventricular systolic function is normal. The right ventricular  size is normal. There is normal pulmonary artery systolic pressure.    3. The mitral valve is normal in structure. No evidence of mitral valve  regurgitation. No evidence of mitral stenosis.   4. The tricuspid valve is abnormal.   5. The aortic valve is tricuspid. Aortic valve regurgitation is not  visualized. No aortic stenosis is present.   6. The inferior vena cava is normal in size with greater than 50%  respiratory variability, suggesting right atrial pressure of 3 mmHg.  __________   Coronary CTA 10/28/2020: Aorta:  Normal size.  No calcifications.  No dissection.   Aortic Valve:  Trileaflet.  No calcifications.   Coronary Arteries:  Normal coronary origin.  Right dominance.   RCA is a dominant artery that gives rise to PDA and PLA. There is no plaque.   Left main gives rise to LAD and LCX arteries. There is no LM disease.   LAD has no plaque.   LCX is a non-dominant artery that gives rise to two obtuse marginal branches. There is no plaque.   Other findings:   Normal pulmonary vein drainage into the left atrium.   Normal left atrial appendage without a thrombus.   Normal size of the pulmonary artery.   IMPRESSION: 1. Normal coronary calcium score of 0. Patient is low risk for coronary events. 2. Normal coronary origin with right dominance. 3. No evidence of CAD. 4. CAD-RADS 0. Consider non-atherosclerotic causes of chest pain. __________   Luci Bank patch 09/2020: The patient was monitored for 12 days, 19 hours. The predominant rhythm was sinus with an average rate of 78 bpm (range 46-128 bpm and sinus). There were rare PACs and PVCs. Two atrial runs lasting up to 13.3 seconds with a maximum rate of 169 bpm were observed. No sustained arrhythmia or prolonged pause occurred. Patient triggered events correspond to sinus rhythm, PACs, PVCs, and one episode of PSVT.   Predominantly sinus rhythm with rare PACs and PVCs as well as 2 episodes of PSVT lasting up to 13 seconds. __________   2D echo 02/12/2020: 1. Left ventricular ejection  fraction, by estimation, is 60 to 65%. The  left ventricle has normal function. The left ventricle has no regional  wall motion abnormalities. Left ventricular diastolic parameters are  consistent with Grade I diastolic  dysfunction (impaired relaxation).   2. Right ventricular systolic function is normal. The right ventricular  size is normal. There is normal  pulmonary artery systolic pressure.  __________   2D echo 05/05/2019: 1. Left ventricular ejection fraction, by visual estimation, is 60 to  65%. The left ventricle has normal function. There is borderline left  ventricular hypertrophy.   2. Left ventricular diastolic parameters are consistent with Grade I  diastolic dysfunction (impaired relaxation).   3. Global right ventricle has normal systolic function.The right  ventricular size is normal. No increase in right ventricular wall  thickness.   4. Left atrial size was normal.   5. Right atrial size was normal.   6. The mitral valve is normal in structure. No evidence of mitral valve  regurgitation. No evidence of mitral stenosis.   7. The tricuspid valve is normal in structure. Tricuspid valve  regurgitation is not demonstrated.   8. The aortic valve is normal in structure. Aortic valve regurgitation is  not visualized. No evidence of aortic valve sclerosis or stenosis.   9. The pulmonic valve was normal in structure. Pulmonic valve  regurgitation is not visualized.  10. Normal pulmonary artery systolic pressure.  11. The inferior vena cava is normal in size with greater than 50%  respiratory variability, suggesting right atrial pressure of 3 mmHg.  __________   Luci Bank patch 03/2019: Baseline rhythm: Sinus   Minimum heart rate: 52 BPM.  Average heart rate: 79 BPM.  Maximal heart rate 119 BPM.  And 169 was the maximum heart rate during the 39-second atrial run   Atrial arrhythmia: 39-second atrial run with maximum heart rate of 169.  Asymptomatic   Ventricular arrhythmia:  Occasional PVCs   Conduction abnormality: None significant   Symptoms: None significant     Conclusion:  Mildly abnormal but largely unremarkable event monitor.  Patient had a brief asymptomatic atrial run as mentioned above. __________   2D echo 07/22/2018: - Left ventricle: The cavity size was normal. Systolic function was    normal. The estimated ejection fraction was in the range of 55%    to 60%. Wall motion was normal; there were no regional wall    motion abnormalities.  - Pericardium, extracardiac: A small pericardial effusion was    identified circumferential to the heart. There was no evidence of    hemodynamic compromise.   Impressions:   - There is no visual evidence of hemodynamic compromise as it    pertains to small pericardial effusion.    EKG:  EKG is ordered today.  The EKG ordered today demonstrates NSR, 68 bpm, nonspecific st/t changes, consistent with prior tracing  Recent Labs: 02/22/2022: TSH 3.122 03/03/2022: Hemoglobin 14.9; Platelets 200 03/04/2022: B Natriuretic Peptide 49.9 12/15/2022: BUN 14; Creatinine, Ser 0.80; Magnesium 2.2; Potassium 4.0; Sodium 137 01/12/2023: ALT 44  Recent Lipid Panel    Component Value Date/Time   CHOL 229 (H) 01/12/2023 0943   TRIG 88 01/12/2023 0943   HDL 48 01/12/2023 0943   CHOLHDL 4.8 01/12/2023 0943   VLDL 18 01/12/2023 0943   LDLCALC 163 (H) 01/12/2023 0943    PHYSICAL EXAM:    VS:  BP 130/74   Pulse 68   Ht 5\' 4"  (1.626 m)   Wt 204 lb 8 oz (92.8 kg)   SpO2 98%   BMI 35.10 kg/m   BMI: Body mass index is 35.1 kg/m.  Physical Exam Vitals reviewed.  Constitutional:      Appearance: She is well-developed.  HENT:     Head: Normocephalic and atraumatic.  Eyes:     General:  Right eye: No discharge.        Left eye: No discharge.  Neck:     Vascular: No JVD.  Cardiovascular:     Rate and Rhythm: Normal rate and regular rhythm.     Heart sounds: Normal heart sounds, S1 normal and S2 normal. Heart  sounds not distant. No midsystolic click and no opening snap. No murmur heard.    No friction rub.  Pulmonary:     Effort: Pulmonary effort is normal. No respiratory distress.     Breath sounds: Normal breath sounds. No decreased breath sounds, wheezing or rales.  Chest:     Chest wall: No tenderness.  Abdominal:     General: There is no distension.  Musculoskeletal:     Cervical back: Normal range of motion.  Skin:    General: Skin is warm and dry.     Nails: There is no clubbing.  Neurological:     Mental Status: She is alert and oriented to person, place, and time.  Psychiatric:        Speech: Speech normal.        Behavior: Behavior normal.        Thought Content: Thought content normal.        Judgment: Judgment normal.     Wt Readings from Last 3 Encounters:  01/12/23 204 lb 8 oz (92.8 kg)  12/08/22 203 lb 12.8 oz (92.4 kg)  08/25/22 202 lb 12.8 oz (92 kg)     ASSESSMENT & PLAN:   Chest pain: No further symptoms. Lexiscan MPI without evidence of ischemia with preserved LVSF. Coronary CTA from 2022 showed a calcium score of 0 with no evidence of CAD. No indication for further ischemic testing. Aggressive risk factor modification and primary prevention. Fasting lipid panel pending.   Questionable history of A-fib: Started on anticoagulation during prior hospitalization for reported A-fib. Details of this are unclear. She has been unable to capture episodes of palpitations with her wearable device. Episodes are infrequent, making Zio patch less ideal. EP has been unable to proceed with ILR given patient's history of anaphylaxis associated with lidocaine. Case has previously been discussed with EP with recommendation to maintain the patient on apixaban 5 mg twice daily given prior reported history of A-fib, along with metoprolol.   History of pericarditis: Resolved.   PSVT: Palpitation burden improved with consistent Pharmacologist. Remains on Toprol-XL 50 mg  twice daily.   HTN: Blood pressure is well controlled in the office at 130/74. Remains on Toprol XL.  Lower extremity swelling: No swelling noted on exam today. No evidence of cardiac decompensation. Has not needed prn Lasix. Continue with leg elevation/compression socks.   Dental extraction: She may proceed with dental extraction without cardiac testing and may remain on Eliqiuis throughout the extraction timeframe per guidelines. She does not require antibiotic prophylaxis.    Disposition: F/u with Dr. Okey Dupre or an APP in 6 months.   Medication Adjustments/Labs and Tests Ordered: Current medicines are reviewed at length with the patient today.  Concerns regarding medicines are outlined above. Medication changes, Labs and Tests ordered today are summarized above and listed in the Patient Instructions accessible in Encounters.   Signed, Eula Listen, PA-C 01/12/2023 12:16 PM     [facility] [place] [address] [place], [place] [address] [phone]

## 2023-01-12 ENCOUNTER — Ambulatory Visit: Payer: Medicare Other | Attending: Physician Assistant | Admitting: Physician Assistant

## 2023-01-12 ENCOUNTER — Other Ambulatory Visit
Admission: RE | Admit: 2023-01-12 | Discharge: 2023-01-12 | Disposition: A | Discharge status: Home / Self Care | Payer: Medicare Other | Source: Ambulatory Visit | Attending: Physician Assistant | Admitting: Physician Assistant

## 2023-01-12 ENCOUNTER — Other Ambulatory Visit: Payer: Self-pay

## 2023-01-12 ENCOUNTER — Encounter: Payer: Self-pay | Admitting: Physician Assistant

## 2023-01-12 VITALS — BP 130/74 | HR 68 | Ht 64.0 in | Wt 204.5 lb

## 2023-01-12 DIAGNOSIS — R079 Chest pain, unspecified: Secondary | ICD-10-CM | POA: Diagnosis present

## 2023-01-12 DIAGNOSIS — R072 Precordial pain: Secondary | ICD-10-CM | POA: Insufficient documentation

## 2023-01-12 DIAGNOSIS — I4891 Unspecified atrial fibrillation: Secondary | ICD-10-CM | POA: Diagnosis present

## 2023-01-12 DIAGNOSIS — I480 Paroxysmal atrial fibrillation: Secondary | ICD-10-CM | POA: Diagnosis present

## 2023-01-12 DIAGNOSIS — R002 Palpitations: Secondary | ICD-10-CM | POA: Diagnosis present

## 2023-01-12 DIAGNOSIS — I10 Essential (primary) hypertension: Secondary | ICD-10-CM | POA: Insufficient documentation

## 2023-01-12 DIAGNOSIS — Z8679 Personal history of other diseases of the circulatory system: Secondary | ICD-10-CM | POA: Diagnosis present

## 2023-01-12 DIAGNOSIS — M7989 Other specified soft tissue disorders: Secondary | ICD-10-CM | POA: Diagnosis present

## 2023-01-12 DIAGNOSIS — I471 Supraventricular tachycardia, unspecified: Secondary | ICD-10-CM | POA: Insufficient documentation

## 2023-01-12 LAB — HEPATIC FUNCTION PANEL
ALT: 44 U/L (ref 0–44)
AST: 35 U/L (ref 15–41)
Albumin: 3.6 g/dL (ref 3.5–5.0)
Alkaline Phosphatase: 72 U/L (ref 38–126)
Bilirubin, Direct: 0.2 mg/dL (ref 0.0–0.2)
Indirect Bilirubin: 0.4 mg/dL (ref 0.3–0.9)
Total Bilirubin: 0.6 mg/dL (ref 0.3–1.2)
Total Protein: 7.4 g/dL (ref 6.5–8.1)

## 2023-01-12 LAB — LIPID PANEL
Cholesterol: 229 mg/dL — ABNORMAL HIGH (ref 0–200)
HDL: 48 mg/dL (ref >40)
LDL Cholesterol: 163 mg/dL — ABNORMAL HIGH (ref 0–99)
Total CHOL/HDL Ratio: 4.8 RATIO
Triglycerides: 88 mg/dL (ref <150)
VLDL: 18 mg/dL (ref 0–40)

## 2023-01-12 NOTE — Patient Instructions (Signed)
Medication Instructions:  Your Physician recommend you continue on your current medication as directed.    *If you need a refill on your cardiac medications before your next appointment, please call your pharmacy*   Lab Work: None ordered.  If you have labs (blood work) drawn today and your tests are completely normal, you will receive your results only by: MyChart Message (if you have MyChart) OR A paper copy in the mail If you have any lab test that is abnormal or we need to change your treatment, we will call you to review the results.   Testing/Procedures: None ordered.    Follow-Up: At [facility], you and your health needs are our priority.  As part of our continuing mission to provide you with exceptional heart care, we have created designated Provider Care Teams.  These Care Teams include your primary Cardiologist (physician) and Advanced Practice Providers (APPs -  Physician Assistants and Nurse Practitioners) who all work together to provide you with the care you need, when you need it.  We recommend signing up for the patient portal called "MyChart".  Sign up information is provided on this After Visit Summary.  MyChart is used to connect with patients for Virtual Visits (Telemedicine).  Patients are able to view lab/test results, encounter notes, upcoming appointments, etc.  Non-urgent messages can be sent to your provider as well.   To learn more about what you can do with MyChart, go to [email or web address].    Your next appointment:   6 month(s)  Provider:   You may see Yvonne Kendall, MD or one of the following Advanced Practice Providers on your designated Care Team:   Nicolasa Ducking, NP Eula Listen, PA-C Cadence Fransico Michael, PA-C Charlsie Quest, NP

## 2023-01-15 ENCOUNTER — Telehealth: Payer: Self-pay | Admitting: *Deleted

## 2023-01-15 NOTE — Telephone Encounter (Signed)
   Pre-operative Risk Assessment    Patient Name: Erica Oneill  DOB: [birth year] MRN: [id number]     Request for Surgical Clearance    Procedure:   SINGLE TOOTH EXTRACTION  Date of Surgery:  Clearance TBD                                 Surgeon:  DR. Lincoln Brigham, DDS Surgeon's Group or Practice Name:  [facility] Phone number:  [phone] Fax number:  [phone]   Type of Clearance Requested:   - Medical  - Pharmacy:  Hold Apixaban (Eliquis)     Type of Anesthesia:   VERSED AND PROPOFOL   Additional requests/questions:    Elpidio Anis   01/15/2023, 5:13 PM

## 2023-01-16 NOTE — Telephone Encounter (Signed)
   Patient Name: CLARENE CURRAN  DOB: 11-Jun-1956 MRN: [id number]  Primary Cardiologist: Yvonne Kendall, MD  Chart reviewed as part of pre-operative protocol coverage.   Simple dental extractions (i.e. 1-2 teeth) are considered low risk procedures per guidelines and generally do not require any specific cardiac clearance. It is also generally accepted that for simple extractions and dental cleanings, there is no need to interrupt blood thinner therapy.   SBE prophylaxis is not required for the patient from a cardiac standpoint.  I will route this recommendation to the requesting party via Epic fax function and remove from pre-op pool.  Please call with questions.  Joylene Grapes, NP 01/16/2023, 8:20 AM

## 2023-01-17 NOTE — Progress Notes (Signed)
Order(s) created erroneously. Erroneous order ID: [id number]  Order moved by: CHART CORRECTION ANALYST, FIFTEEN  Order move date/time: 01/17/2023 3:34 PM  Source Patient: [id number]  Source Contact: 01/12/2023  Destination Patient: [id number]  Destination Contact: 12/06/2022

## 2023-03-14 IMAGING — CT CT HEART MORP W/ CTA COR W/ SCORE W/ CA W/CM &/OR W/O CM
1 of 13 series · 4 of 20 positions shown, 5 images · non-contrast
Comparison: None.

Addendum:
CLINICAL DATA: Chest pain

EXAM:
Cardiac/Coronary  CTA
TECHNIQUE: The patient was scanned on a Siemens Somatoform go.Top scanner.

[Series 28: multiphase % cta coronary 0.80 · axial · 0.38mm/px · z∈[-1065,-1006]mm · 4 of 2376 slices shown, 5 images]
[im 476/2376  vessel]
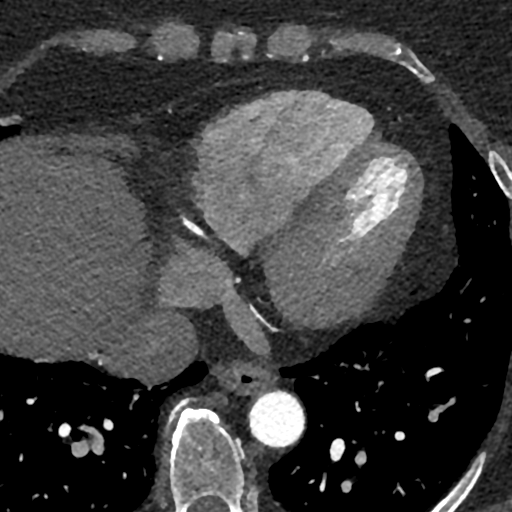
[im 476/2376  lung]
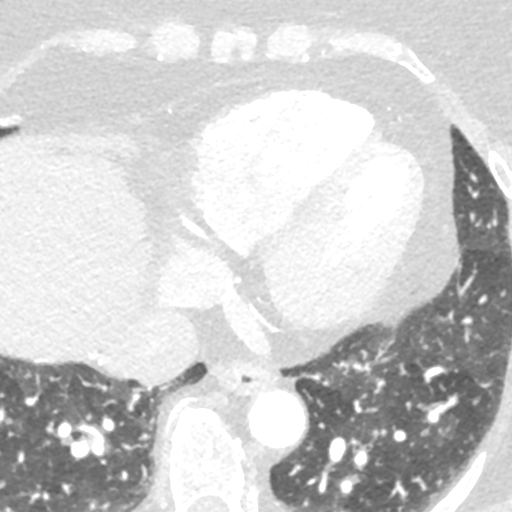
[im 951/2376  vessel]
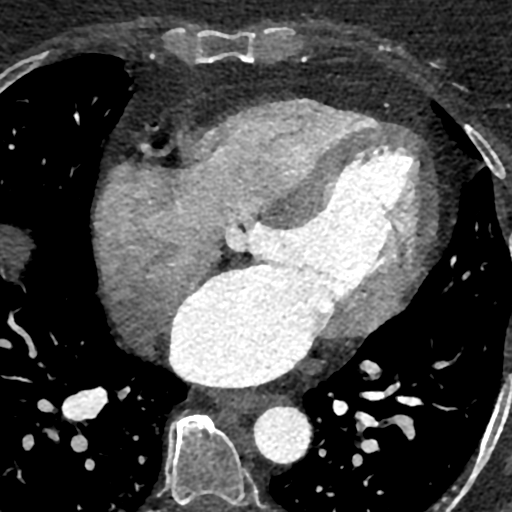
[im 1426/2376  vessel]
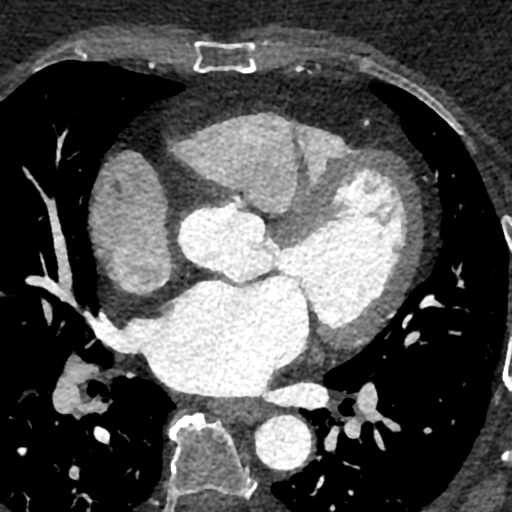
[im 1901/2376  vessel]
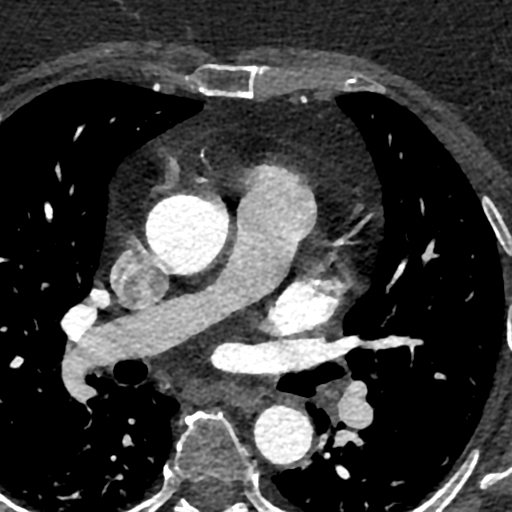

[4 of 20 positions shown; findings below may reference images not displayed]

FINDINGS: A retrospective scan was triggered in the descending thoracic aorta.
Axial non-contrast 3 mm slices were carried out through the heart.
The data set was analyzed on a dedicated work station and scored
using the Agatson method. Gantry rotation speed was 330 msecs and
collimation was .6 mm. 100mg of metoprolol and 0.8 mg of sl NTG was
given. The 3D data set was reconstructed in 5% intervals of the
60-95 % of the R-R cycle. Diastolic phases were analyzed on a
dedicated work station using MPR, MIP and VRT modes. The patient
received 100 cc of contrast.

Aorta:  Normal size.  No calcifications.  No dissection.

Aortic Valve:  Trileaflet.  No calcifications.

Coronary Arteries:  Normal coronary origin.  Right dominance.

RCA is a dominant artery that gives rise to PDA and PLA. There is no
plaque.

Left main gives rise to LAD and LCX arteries. There is no LM
disease.

LAD has no plaque.

LCX is a non-dominant artery that gives rise to two obtuse marginal
branches. There is no plaque.

Other findings:

Normal pulmonary vein drainage into the left atrium.

Normal left atrial appendage without a thrombus.

Normal size of the pulmonary artery.
IMPRESSION: 1. Normal coronary calcium score of 0. Patient is low risk for
coronary events.

2. Normal coronary origin with right dominance.

3. No evidence of CAD.

4. CAD-RADS 0. Consider non-atherosclerotic causes of chest pain.

EXAM:
OVER-READ INTERPRETATION  CT CHEST

The following report is an over-read performed by radiologist Dr.
Benaziza Gz [REDACTED] on 10/28/2020. This over-read
does not include interpretation of cardiac or coronary anatomy or
pathology. The Coronary CTA interpretation by the cardiologist is
attached.
FINDINGS: The visualized portions of the lower lung fields show no suspicious
nodules, masses, or infiltrates. No pleural fluid seen.

The visualized portions of the mediastinum and chest wall are
unremarkable.
IMPRESSION: No significant non-cardiovascular abnormality seen in visualized
portion of the thorax.

*** End of Addendum ***
FINDINGS: A retrospective scan was triggered in the descending thoracic aorta.
Axial non-contrast 3 mm slices were carried out through the heart.
The data set was analyzed on a dedicated work station and scored
using the Agatson method. Gantry rotation speed was 330 msecs and
collimation was .6 mm. 100mg of metoprolol and 0.8 mg of sl NTG was
given. The 3D data set was reconstructed in 5% intervals of the
60-95 % of the R-R cycle. Diastolic phases were analyzed on a
dedicated work station using MPR, MIP and VRT modes. The patient
received 100 cc of contrast.

Aorta:  Normal size.  No calcifications.  No dissection.

Aortic Valve:  Trileaflet.  No calcifications.

Coronary Arteries:  Normal coronary origin.  Right dominance.

RCA is a dominant artery that gives rise to PDA and PLA. There is no
plaque.

Left main gives rise to LAD and LCX arteries. There is no LM
disease.

LAD has no plaque.

LCX is a non-dominant artery that gives rise to two obtuse marginal
branches. There is no plaque.

Other findings:

Normal pulmonary vein drainage into the left atrium.

Normal left atrial appendage without a thrombus.

Normal size of the pulmonary artery.
IMPRESSION: 1. Normal coronary calcium score of 0. Patient is low risk for
coronary events.

2. Normal coronary origin with right dominance.

3. No evidence of CAD.

4. CAD-RADS 0. Consider non-atherosclerotic causes of chest pain.

## 2023-05-31 ENCOUNTER — Other Ambulatory Visit: Payer: Self-pay | Admitting: Physician Assistant

## 2023-05-31 DIAGNOSIS — I4891 Unspecified atrial fibrillation: Secondary | ICD-10-CM

## 2023-06-05 NOTE — Telephone Encounter (Signed)
Prescription refill request for Eliquis received. Indication: AF Last office visit: 7/24 Scr:0.8 Age: 67 Weight: 92.8 kg

## 2023-08-06 NOTE — Progress Notes (Deleted)
 Cardiology Clinic Note   Date: 08/06/2023 ID: Erica Oneill, DOB 09/20/1955, MRN [id number]  Primary Cardiologist:  Yvonne Kendall, MD  Chief Complaint   Erica Oneill is a 68 y.o. female who presents to the clinic today for ***  Patient Profile   Erica Oneill is followed by Dr. Okey Dupre for the history outlined below.      Past medical history significant for: Chest pain. Nuclear stress test 12/15/2022: No significant ischemia.  No EKG changes concerning for ischemia at peak stress or recovery.  CT attenuation correction images with no significant aortic sclerosis, unable to exclude ostial RCA disease versus aortic valve sclerosis.  Low risk scan. Chronic diastolic heart failure/pericarditis. Echo 03/04/2022: EF 55 to 60%.  No RWMA.  Normal diastolic parameters.  Normal RV size/function.  Normal PA pressure.  No significant valvular abnormalities. Palpitations/SVT. 14-day ZIO 10/26/2020: HR 46 to 128 bpm, average 78 bpm.  Predominantly sinus rhythm.  Rare PACs/PVCs.  2 atrial runs lasting up to 13.3 seconds with max HR 169 bpm.  No sustained arrhythmia or prolonged pauses.  Patient triggered events corresponded to sinus rhythm, PACs, PVCs and 1 episode of PSVT. PAF. Hypertension. GERD.  In summary, patient was previously followed by Dr. Tomie China and transitioned care to Dr. Okey Dupre. She was admitted to the hospital in January 2020 for acute pericarditis. Echo at that time demonstrated EF 55-60%, no RWMA, small circumferential pericardial effusion without evidence of hemodynamic compromise. Zio monitor in October 2020 showed predominantly sinus rhythm with average HR 79 bpm, 39 second atrial run with max rate 169 bpm and occasional PVCs. Repeat echo November 2020 showed EF 60-65%, borderline LVH, Grade I DD, normal RV size/function, normal PA pressure and no evidence of pericardial effusion. Echo in August 2021 was essential unchanged. Repeat Zio in May 2022 showed predominantly sinus rhythm  as detailed above. Coronary CTA May 2022 showed calcium score of 0 with evidence of CAD.   Patient was in August 2023 for an episode of elevated HR and palpitations. She was started on Bystolic but could not tolerate it secondary to headaches. She presented to [facility] ED in September 2023 with chest pain and palpitations. Troponin negative x 3, BNP 49. She was told EKG showed new onset Afib with RVR, 151 bpm. She converted to NSR with Bystolic and Cardizem and was placed on Eliquis. Echo at that time showed normal LV function as detailed above. Upon discharge Bystolic was transitioned to metoprolol.   Outpatient cardiology reviewed and felt EKG was consistent with SVT not afib.  She reported elevated heart rate whenever she ambulated as well as spontaneous tachycardia.  She did not feel that metoprolol was helping her at BP had trended up since being transitioned from [place].  Metoprolol was increased and she was referred to EP.  She was seen by Dr. Lalla Brothers on 03/15/2022 with unclear diagnosis of PSVT felt to possibly be related to A. tach versus AVRT versus atypical AVNRT.  There is no evidence of flutter waves or A-fib.  She was transitioned to Toprol.  EP did not see clear evidence of A-fib on available EKG or telemetry strip.  Placing a loop recorder was complicated due to documented history of lidocaine causing anaphylaxis and unclear if alternative such as bupivacaine were ever received.  She was referred to an allergist to assist with local anesthesia options.  In December 2023 she followed up with EP and indicated she had not gone to the allergist and did not plan  to do so.  She reported brief palpitations at that time but was unable to capture on Kardia mobile device or Apple Watch.  Still no documented evidence of A-fib.  Since she was unable to capture episodes Eliquis was continued with recommendation to discontinue if captured episodes did not demonstrate A-fib.  In June 2024 she noted 1 episode of  tachypalpitations lasting 25 minutes.  She question if this was related to missing a dose of metoprolol versus her updated prescription being from a different manufacturer.  She requested new metoprolol from the pharmacy and has had no further palpitations.  She had a low risk nuclear stress test as detailed above.  Patient was last seen in the office by Eula Listen, PA-C on 01/12/2023.  She was doing well at that time.  She had not been taking valsartan at BP was well-controlled.     History of Present Illness    Today, patient ***  Chronic diastolic heart failure/history of pericarditis Echo September 2023 showed normal LV/RV function, normal diastolic parameters, normal PA pressure.  Patient*** Euvolemic and well compensated on exam. -Continue Toprol and as needed Lasix.  Palpitations/questionable A-fib 14-day ZIO May 2022 showed 2 atrial runs lasting up to 13.3 seconds with no sustained arrhythmia or prolonged pauses.  Patient was diagnosed with A-fib in the emergency department in August 2023.  Upon further review of the EKG and outpatient cardiology it was felt to be more consistent with SVT not A-fib.  Since that time she had continued to have palpitations not captured on [employer] mobile or [employer].  Loop recorder implantation complicated by anaphylaxis with lidocaine.  Patient*** Denies spontaneous bleeding concerns.  EKG*** -Continue Toprol and Eliquis.  Hypertension BP today*** -Continue Toprol.  ROS: All other systems reviewed and are otherwise negative except as noted in History of Present Illness.  EKGs/Labs Reviewed        12/15/2022: BUN 14; Creatinine, Ser 0.80; Potassium 4.0; Sodium 137 01/12/2023: ALT 44; AST 35   No results found for requested labs within last 365 days.   No results found for requested labs within last 365 days.   No results found for requested labs within last 365 days.  ***  Risk Assessment/Calculations    {Does this patient have ATRIAL  FIBRILLATION?:[phone]} No BP recorded.  {Refresh Note OR Click here to enter BP  :1}***        Physical Exam    VS:  There were no vitals taken for this visit. , BMI There is no height or weight on file to calculate BMI.  GEN: Well nourished, well developed, in no acute distress. Neck: No JVD or carotid bruits. Cardiac: *** RRR. No murmurs. No rubs or gallops.   Respiratory:  Respirations regular and unlabored. Clear to auscultation without rales, wheezing or rhonchi. GI: Soft, nontender, nondistended. Extremities: Radials/DP/PT 2+ and equal bilaterally. No clubbing or cyanosis. No edema ***  Skin: Warm and dry, no rash. Neuro: Strength intact.  Assessment & Plan   ***  Disposition: ***     {Are you ordering a CV Procedure (e.g. stress test, cath, DCCV, TEE, etc)?   Press F2        :[id number]}   Signed, Etta Grandchild. Bryse Blanchette, DNP, NP-C

## 2023-08-08 ENCOUNTER — Ambulatory Visit: Payer: Medicare Other | Admitting: Student

## 2023-08-18 NOTE — Progress Notes (Unsigned)
 Cardiology Clinic Note   Date: 08/21/2023 ID: Erica Oneill, DOB May 24, 1956, MRN [id number]  Primary Cardiologist:  Yvonne Kendall, MD  Chief Complaint   Erica Oneill is a 68 y.o. female who presents to the clinic today for routine follow up.   Patient Profile   Erica Oneill is followed by Dr. Okey Dupre for the history outlined below.      Past medical history significant for: Chest pain. Nuclear stress test 12/15/2022: No significant ischemia.  No EKG changes concerning for ischemia at peak stress or recovery.  CT attenuation correction images with no significant aortic sclerosis, unable to exclude ostial RCA disease versus aortic valve sclerosis.  Low risk scan. Chronic diastolic heart failure/pericarditis. Echo 03/04/2022: EF 55 to 60%.  No RWMA.  Normal diastolic parameters.  Normal RV size/function.  Normal PA pressure.  No significant valvular abnormalities. Palpitations/SVT. 14-day ZIO 10/26/2020: HR 46 to 128 bpm, average 78 bpm.  Predominantly sinus rhythm.  Rare PACs/PVCs.  2 atrial runs lasting up to 13.3 seconds with max HR 169 bpm.  No sustained arrhythmia or prolonged pauses.  Patient triggered events corresponded to sinus rhythm, PACs, PVCs and 1 episode of PSVT. PAF. Hypertension. Hyperlipidemia. Lipid panel 01/12/2023: LDL 163, HDL 48, TG 88, total 229.  GERD.  In summary, patient was previously followed by Dr. Tomie China and transitioned care to Dr. Okey Dupre. She was admitted to the hospital in January 2020 for acute pericarditis. Echo at that time demonstrated EF 55-60%, no RWMA, small circumferential pericardial effusion without evidence of hemodynamic compromise. Zio monitor in October 2020 showed predominantly sinus rhythm with average HR 79 bpm, 39 second atrial run with max rate 169 bpm and occasional PVCs. Repeat echo November 2020 showed EF 60-65%, borderline LVH, Grade I DD, normal RV size/function, normal PA pressure and no evidence of pericardial effusion. Echo in  August 2021 was essential unchanged. Repeat Zio in May 2022 showed predominantly sinus rhythm as detailed above. Coronary CTA May 2022 showed calcium score of 0 with evidence of CAD.   Patient was in August 2023 for an episode of elevated HR and palpitations. She was started on Bystolic but could not tolerate it secondary to headaches. She presented to [facility] ED in September 2023 with chest pain and palpitations. Troponin negative x 3, BNP 49. She was told EKG showed new onset Afib with RVR, 151 bpm. She converted to NSR with Bystolic and Cardizem and was placed on Eliquis. Echo at that time showed normal LV function as detailed above. Upon discharge Bystolic was transitioned to metoprolol.   Outpatient cardiology reviewed and felt EKG was consistent with SVT not afib.  She reported elevated heart rate whenever she ambulated as well as spontaneous tachycardia.  She did not feel that metoprolol was helping her at BP had trended up since being transitioned from [place].  Metoprolol was increased and she was referred to EP.  She was seen by Dr. Lalla Brothers on 03/15/2022 with unclear diagnosis of PSVT felt to possibly be related to A. tach versus AVRT versus atypical AVNRT.  There is no evidence of flutter waves or A-fib.  She was transitioned to Toprol.  EP did not see clear evidence of A-fib on available EKG or telemetry strip.  Placing a loop recorder was complicated due to documented history of lidocaine causing anaphylaxis and unclear if alternative such as bupivacaine were ever received.  She was referred to an allergist to assist with local anesthesia options.  In December 2023 she followed  up with EP and indicated she had not gone to the allergist and did not plan to do so.  She reported brief palpitations at that time but was unable to capture on Kardia mobile device or Apple Watch.  Still no documented evidence of A-fib.  Since she was unable to capture episodes Eliquis was continued with recommendation to  discontinue if captured episodes did not demonstrate A-fib.  In June 2024 she noted 1 episode of tachypalpitations lasting 25 minutes.  She question if this was related to missing a dose of metoprolol versus her updated prescription being from a different manufacturer.  She requested new metoprolol from the pharmacy and has had no further palpitations.  She had a low risk nuclear stress test as detailed above.  Patient was last seen in the office by Eula Listen, PA-C on 01/12/2023.  She was doing well at that time.  She had not been taking valsartan and BP was well-controlled.     History of Present Illness    Today, patient is accompanied by her son. Patient denies shortness of breath, orthopnea or PND. She reports occasional lower extremity edema that she manages with as needed Lasix. Last dose of Lasix was 4 weeks ago. She reports dyspnea with heavier exertion but none with routine activities. No chest pain, pressure, or tightness. She has occasional palpitations well controlled with Toprol. Her BP is elevated today. She does not check it at home. She admits to dietary indiscretion with sodium and not following a heart heathy diet. She has noticed her weight has slowly been increasing throughout the winter. She does not participate in regular exercise.     ROS: All other systems reviewed and are otherwise negative except as noted in History of Present Illness.  EKGs/Labs Reviewed    EKG Interpretation Date/Time:  Tuesday August 21 2023 08:09:15 EST Ventricular Rate:  69 PR Interval:  140 QRS Duration:  78 QT Interval:  386 QTC Calculation: 413 R Axis:   -17  Text Interpretation: Normal sinus rhythm Minimal voltage criteria for LVH, may be normal variant ( R in aVL ) Cannot rule out Anterior infarct , age undetermined When compared with ECG of 12-Jan-2023 10:45, No significant change was found Confirmed by Carlos Levering [phone]) on 08/21/2023 8:16:07 AM   12/15/2022: BUN 14; Creatinine,  Ser 0.80; Potassium 4.0; Sodium 137 01/12/2023: ALT 44; AST 35    Risk Assessment/Calculations     CHA2DS2-VASc Score = 4   This indicates a 4.8% annual risk of stroke. The patient's score is based upon: CHF History: 1 HTN History: 1 Diabetes History: 0 Stroke History: 0 Vascular Disease History: 0 Age Score: 1 Gender Score: 1    HYPERTENSION CONTROL Vitals:   08/21/23 0810 08/21/23 0952  BP: (!) 160/80 (!) 150/80    The patient's blood pressure is elevated above target today.  In order to address the patient's elevated BP: Blood pressure will be monitored at home to determine if medication changes need to be made.    Physical Exam    VS:  BP (!) 150/80 (BP Location: Left Arm, Patient Position: Sitting, Cuff Size: Large)   Pulse 69   Ht 5\' 5"  (1.651 m)   Wt 215 lb 12.8 oz (97.9 kg)   SpO2 96%   BMI 35.91 kg/m  , BMI Body mass index is 35.91 kg/m.  GEN: Well nourished, well developed, in no acute distress. Neck: No JVD or carotid bruits. Cardiac:  RRR. No murmurs. No rubs or gallops.  Respiratory:  Respirations regular and unlabored. Clear to auscultation without rales, wheezing or rhonchi. GI: Soft, nontender, nondistended. Extremities: Radials/DP/PT 2+ and equal bilaterally. No clubbing or cyanosis. 1+ pitting edema bilaterally.   Skin: Warm and dry, no rash. Neuro: Strength intact.  Assessment & Plan   Chronic diastolic heart failure/history of pericarditis Echo September 2023 showed normal LV/RV function, normal diastolic parameters, normal PA pressure.  Patient reports occasional lower extremity edema controlled with as needed Lasix. She does not weigh daily. She admits to dietary indiscretion with sodium in the last several months. She reports dyspnea with heavier exertion but none with routine activities. She has 1+ pitting edema bilaterally on exam today otherwise euvolemic and well compensated. -Continue Toprol and as needed  Lasix.  Palpitations/questionable A-fib 14-day ZIO May 2022 showed 2 atrial runs lasting up to 13.3 seconds with no sustained arrhythmia or prolonged pauses.  Patient was diagnosed with A-fib in the emergency department in August 2023.  Upon further review of the EKG and outpatient cardiology it was felt to be more consistent with SVT not A-fib.  Since that time she had continued to have palpitations not captured on [employer] mobile or [employer].  Loop recorder implantation complicated by anaphylaxis with lidocaine.  Patient reports occasional palpitations. Denies spontaneous bleeding concerns.  EKG today is NSR 69 bpm.  -Continue Toprol and Eliquis.  Hypertension BP today 160/80 on intake and 150/80 on my recheck. She does not normally check her BP at home. No headache or dizziness reported today.  -Check BP x 1 week and send readings through MyChart.  -Continue Toprol. If BP consistently >130/80 will consider changing Toprol to carvedilol.   Hyperlipidemia/statin myopathy  LDL July 2024 163, not at goal. Patient reports myalgias on statins. She does not want to try low dose statins or any other cholesterol lowering drugs.  -Follow Mediterranean diet and increase physical activity.   Disposition: CBC and BMP today. Return in 6 months or sooner as needed.          Signed, Etta Grandchild. Eliazer Hemphill, DNP, NP-C

## 2023-08-21 ENCOUNTER — Ambulatory Visit: Payer: Medicare Other | Attending: Student | Admitting: Student

## 2023-08-21 ENCOUNTER — Encounter: Payer: Self-pay | Admitting: Student

## 2023-08-21 VITALS — BP 150/80 | HR 69 | Ht 65.0 in | Wt 215.8 lb

## 2023-08-21 DIAGNOSIS — T466X5A Adverse effect of antihyperlipidemic and antiarteriosclerotic drugs, initial encounter: Secondary | ICD-10-CM | POA: Diagnosis present

## 2023-08-21 DIAGNOSIS — I10 Essential (primary) hypertension: Secondary | ICD-10-CM | POA: Insufficient documentation

## 2023-08-21 DIAGNOSIS — I480 Paroxysmal atrial fibrillation: Secondary | ICD-10-CM | POA: Insufficient documentation

## 2023-08-21 DIAGNOSIS — I471 Supraventricular tachycardia, unspecified: Secondary | ICD-10-CM | POA: Diagnosis present

## 2023-08-21 DIAGNOSIS — R002 Palpitations: Secondary | ICD-10-CM | POA: Diagnosis present

## 2023-08-21 DIAGNOSIS — I5032 Chronic diastolic (congestive) heart failure: Secondary | ICD-10-CM | POA: Insufficient documentation

## 2023-08-21 DIAGNOSIS — G720 Drug-induced myopathy: Secondary | ICD-10-CM | POA: Diagnosis present

## 2023-08-21 DIAGNOSIS — E7800 Pure hypercholesterolemia, unspecified: Secondary | ICD-10-CM | POA: Insufficient documentation

## 2023-08-21 NOTE — Patient Instructions (Addendum)
 Monitor blood pressure readings for 1 week and send to [place] by mychart. Here are some tips when monitoring.   Please monitor blood pressures and keep a log of your readings.   Make sure to check 2 hours after your medications.   AVOID these things for 30 minutes before checking your blood pressure: No Drinking caffeine. No Drinking alcohol. No Eating. No Smoking. No Exercising.  Five minutes before checking your blood pressure: Pee. Sit in a dining chair. Avoid sitting in a soft couch or armchair. Be quiet. Do not talk.   Medication Instructions:  No changes at this time.   *If you need a refill on your cardiac medications before your next appointment, please call your pharmacy*   Lab Work: CBC & BMET today.   If you have labs (blood work) drawn today and your tests are completely normal, you will receive your results only by: MyChart Message (if you have MyChart) OR A paper copy in the mail If you have any lab test that is abnormal or we need to change your treatment, we will call you to review the results.   Testing/Procedures: None   Follow-Up: At [facility], you and your health needs are our priority.  As part of our continuing mission to provide you with exceptional heart care, we have created designated Provider Care Teams.  These Care Teams include your primary Cardiologist (physician) and Advanced Practice Providers (APPs -  Physician Assistants and Nurse Practitioners) who all work together to provide you with the care you need, when you need it.    Your next appointment:   6 month(s)  Provider:   Yvonne Kendall, MD or Carlos Levering, NP

## 2023-08-22 LAB — BASIC METABOLIC PANEL
BUN/Creatinine Ratio: 16 (ref 12–28)
BUN: 14 mg/dL (ref 8–27)
CO2: 24 mmol/L (ref 20–29)
Calcium: 9.4 mg/dL (ref 8.7–10.3)
Chloride: 104 mmol/L (ref 96–106)
Creatinine, Ser: 0.9 mg/dL (ref 0.57–1.00)
Glucose: 95 mg/dL (ref 70–99)
Potassium: 4.3 mmol/L (ref 3.5–5.2)
Sodium: 143 mmol/L (ref 134–144)
eGFR: 69 mL/min/{1.73_m2} (ref >59)

## 2023-08-22 LAB — CBC
Hematocrit: 44.8 % (ref 34.0–46.6)
Hemoglobin: 14.7 g/dL (ref 11.1–15.9)
MCH: 32.9 pg (ref 26.6–33.0)
MCHC: 32.8 g/dL (ref 31.5–35.7)
MCV: 100 fL — ABNORMAL HIGH (ref 79–97)
Platelets: 205 10*3/uL (ref 150–450)
RBC: 4.47 x10E6/uL (ref 3.77–5.28)
RDW: 11.8 % (ref 11.7–15.4)
WBC: 9.1 10*3/uL (ref 3.4–10.8)

## 2023-09-13 ENCOUNTER — Other Ambulatory Visit: Payer: Self-pay

## 2023-09-13 DIAGNOSIS — I4891 Unspecified atrial fibrillation: Secondary | ICD-10-CM

## 2023-09-13 MED ORDER — APIXABAN 5 MG PO TABS: 5.0000 mg | ORAL_TABLET | Freq: Two times a day (BID) | ORAL | 2 refills | Status: DC

## 2023-09-13 NOTE — Telephone Encounter (Signed)
 Received faxed refill request from [facility] for Eliquis.  Pt last saw Erica Levering, NP on 08/21/23, last labs 08/21/23 Creat 0.90, age 68, weight 97.9kg, based on specified criteria pt is on appropriate dosage of Eliquis 5mg  BID for afib.  Will refill rx.

## 2023-09-26 ENCOUNTER — Telehealth: Payer: Self-pay | Admitting: Physician Assistant

## 2023-09-26 NOTE — Telephone Encounter (Signed)
 Pt c/o swelling/edema: STAT if pt has developed SOB within 24 hours  If swelling, where is the swelling located? Left leg  How much weight have you gained and in what time span? Not sure   Have you gained 2 pounds in a day or 5 pounds in a week? States she she has gained 2 lbs in  day and that's when she will take her fluid pill.   Do you have a log of your daily weights (if so, list)? She does not have them written down she stated she kept a mental note of it.    Are you currently taking a fluid pill? Yes PRN   Are you currently SOB? No   Have you traveled recently in a car or plane for an extended period of time? No

## 2023-09-26 NOTE — Telephone Encounter (Signed)
 Pt called and reports left leg (ankle to knee) swelling everyday since 3/31 that "mostly resolves" overnight and then returns during the day.  Pt reports some transient SOB, pt took PRN lasix Monday and today (Wednesday).  Pt unable to quantify weight but reports 2 pounds weight loss on Monday after taking lasix - pt is poor historian for actual weight numbers.    Pt denies any redness or warmth to left leg.  Pt counseled on "same time, same scale, same situation" to collect weight in pounds every day and record that on her calendar for provider.  Pt requesting to see DW, appt made.  Pt given ED precautions for dyspnea and increased SOB

## 2023-10-01 NOTE — Progress Notes (Unsigned)
 Cardiology Clinic Note   Date: 10/01/2023 ID: Erica Oneill, DOB 1956-06-05, MRN [id number]  Primary Cardiologist:  Yvonne Kendall, MD  Chief Complaint   Erica Oneill is a 68 y.o. female who presents to the clinic today for ***  Patient Profile   Erica Oneill is followed by *** for the history outlined below.       Past medical history significant for: Chest pain. Nuclear stress test 12/15/2022: No significant ischemia.  No EKG changes concerning for ischemia at peak stress or recovery.  CT attenuation correction images with no significant aortic sclerosis, unable to exclude ostial RCA disease versus aortic valve sclerosis.  Low risk scan. Chronic diastolic heart failure/pericarditis. Echo 03/04/2022: EF 55 to 60%.  No RWMA.  Normal diastolic parameters.  Normal RV size/function.  Normal PA pressure.  No significant valvular abnormalities. Palpitations/SVT. 14-day ZIO 10/26/2020: HR 46 to 128 bpm, average 78 bpm.  Predominantly sinus rhythm.  Rare PACs/PVCs.  2 atrial runs lasting up to 13.3 seconds with max HR 169 bpm.  No sustained arrhythmia or prolonged pauses.  Patient triggered events corresponded to sinus rhythm, PACs, PVCs and 1 episode of PSVT. PAF. Hypertension. Hyperlipidemia. Lipid panel 01/12/2023: LDL 163, HDL 48, TG 88, total 229.  GERD.  In summary, patient was previously followed by Dr. Tomie China and transitioned care to Dr. Okey Dupre. She was admitted to the hospital in January 2020 for acute pericarditis. Echo at that time demonstrated EF 55-60%, no RWMA, small circumferential pericardial effusion without evidence of hemodynamic compromise. Zio monitor in October 2020 showed predominantly sinus rhythm with average HR 79 bpm, 39 second atrial run with max rate 169 bpm and occasional PVCs. Repeat echo November 2020 showed EF 60-65%, borderline LVH, Grade I DD, normal RV size/function, normal PA pressure and no evidence of pericardial effusion. Echo in August 2021 was  essential unchanged. Repeat Zio in May 2022 showed predominantly sinus rhythm as detailed above. Coronary CTA May 2022 showed calcium score of 0 with evidence of CAD.   Patient was in August 2023 for an episode of elevated HR and palpitations. She was started on Bystolic but could not tolerate it secondary to headaches. She presented to [facility] ED in September 2023 with chest pain and palpitations. Troponin negative x 3, BNP 49. She was told EKG showed new onset Afib with RVR, 151 bpm. She converted to NSR with Bystolic and Cardizem and was placed on Eliquis. Echo at that time showed normal LV function as detailed above. Upon discharge Bystolic was transitioned to metoprolol.   Outpatient cardiology reviewed and felt EKG was consistent with SVT not afib.  She reported elevated heart rate whenever she ambulated as well as spontaneous tachycardia.  She did not feel that metoprolol was helping her at BP had trended up since being transitioned from [place].  Metoprolol was increased and she was referred to EP.  She was seen by Dr. Lalla Brothers on 03/15/2022 with unclear diagnosis of PSVT felt to possibly be related to A. tach versus AVRT versus atypical AVNRT.  There is no evidence of flutter waves or A-fib.  She was transitioned to Toprol.  EP did not see clear evidence of A-fib on available EKG or telemetry strip.  Placing a loop recorder was complicated due to documented history of lidocaine causing anaphylaxis and unclear if alternative such as bupivacaine were ever received.  She was referred to an allergist to assist with local anesthesia options.  In December 2023 she followed up with EP  and indicated she had not gone to the allergist and did not plan to do so.  She reported brief palpitations at that time but was unable to capture on Kardia mobile device or Apple Watch.  Still no documented evidence of A-fib.  Since she was unable to capture episodes Eliquis was continued with recommendation to discontinue if  captured episodes did not demonstrate A-fib.  In June 2024 she noted 1 episode of tachypalpitations lasting 25 minutes.  She question if this was related to missing a dose of metoprolol versus her updated prescription being from a different manufacturer.  She requested new metoprolol from the pharmacy and has had no further palpitations.  She had a low risk nuclear stress test as detailed above.  Patient was seen in the office by Eula Listen, PA-C on 01/12/2023.  She was doing well at that time.  She had not been taking valsartan and BP was well-controlled.   Patient was last seen in the office by by me on 08/21/2023 for routine follow up. She reported stable dyspnea and lower extremity edema. Her BP was elevated 160/80 on intake and 150/80. She admitted to dietary indiscretion. She was instructed to monitor BP for 1 week and send in readings.   Patient contacted the office on 09/26/2023 with complaints of left leg edema. Per triage RN "Pt called and reports left leg (ankle to knee) swelling everyday since 3/31 that "mostly resolves" overnight and then returns during the day.  Pt reports some transient SOB, pt took PRN lasix Monday and today (Wednesday).  Pt unable to quantify weight but reports 2 pounds weight loss on Monday after taking lasix - pt is poor historian for actual weight numbers.  Pt denies any redness or warmth to left leg. Pt counseled on "same time, same scale, same situation" to collect weight in pounds every day and record that on her calendar for provider."     History of Present Illness    Today, patient ***  Chronic diastolic heart failure/history of pericarditis Echo September 2023 showed normal LV/RV function, normal diastolic parameters, normal PA pressure.  Patient *** -Continue Toprol and as needed Lasix.***   Hypertension BP today ***  -Continue Toprol. If BP consistently >130/80 will consider changing Toprol to carvedilol. ***    ROS: All other systems reviewed and are  otherwise negative except as noted in History of Present Illness.  EKGs/Labs Reviewed        01/12/2023: ALT 44; AST 35 08/21/2023: BUN 14; Creatinine, Ser 0.90; Potassium 4.3; Sodium 143   08/21/2023: Hemoglobin 14.7; WBC 9.1   No results found for requested labs within last 365 days.   No results found for requested labs within last 365 days.  ***  Risk Assessment/Calculations    {Does this patient have ATRIAL FIBRILLATION?[phone]} No BP recorded.  {Refresh Note OR Click here to enter BP  :1}***        Physical Exam    VS:  There were no vitals taken for this visit. , BMI There is no height or weight on file to calculate BMI.  GEN: Well nourished, well developed, in no acute distress. Neck: No JVD or carotid bruits. Cardiac: *** RRR. No murmurs. No rubs or gallops.   Respiratory:  Respirations regular and unlabored. Clear to auscultation without rales, wheezing or rhonchi. GI: Soft, nontender, nondistended. Extremities: Radials/DP/PT 2+ and equal bilaterally. No clubbing or cyanosis. No edema ***  Skin: Warm and dry, no rash. Neuro: Strength intact.  Assessment &  Plan   ***  Disposition: ***     {Are you ordering a CV Procedure (e.g. stress test, cath, DCCV, TEE, etc)?   Press F2        :[id number]}   Signed, Etta Grandchild. Mellissa Conley, DNP, NP-C

## 2023-10-05 ENCOUNTER — Encounter: Payer: Self-pay | Admitting: Student

## 2023-10-05 ENCOUNTER — Ambulatory Visit: Attending: Student | Admitting: Student

## 2023-10-05 VITALS — BP 142/80 | HR 63 | Ht 64.0 in | Wt 219.0 lb

## 2023-10-05 DIAGNOSIS — R600 Localized edema: Secondary | ICD-10-CM | POA: Diagnosis present

## 2023-10-05 DIAGNOSIS — I5032 Chronic diastolic (congestive) heart failure: Secondary | ICD-10-CM | POA: Diagnosis present

## 2023-10-05 DIAGNOSIS — R0609 Other forms of dyspnea: Secondary | ICD-10-CM | POA: Insufficient documentation

## 2023-10-05 DIAGNOSIS — I10 Essential (primary) hypertension: Secondary | ICD-10-CM | POA: Diagnosis present

## 2023-10-05 DIAGNOSIS — Z79899 Other long term (current) drug therapy: Secondary | ICD-10-CM | POA: Insufficient documentation

## 2023-10-05 DIAGNOSIS — Z8679 Personal history of other diseases of the circulatory system: Secondary | ICD-10-CM | POA: Diagnosis present

## 2023-10-05 DIAGNOSIS — I471 Supraventricular tachycardia, unspecified: Secondary | ICD-10-CM | POA: Diagnosis present

## 2023-10-05 MED ORDER — FUROSEMIDE 20 MG PO TABS: ORAL_TABLET | ORAL | 3 refills | Status: DC

## 2023-10-05 NOTE — Patient Instructions (Addendum)
 Medication Instructions:  Your physician recommends the following medication changes.  START TAKING: Lasix 20 mg daily for 4 days, then as needed for weight gain of 2 pounds or greater overnight or 5 pounds or in 1 week, or for lower extremity swelling  *If you need a refill on your cardiac medications before your next appointment, please call your pharmacy*  Lab Work: Your provider would like for you to have following labs drawn today BMeT.   If you have labs (blood work) drawn today and your tests are completely normal, you will receive your results only by: MyChart Message (if you have MyChart) OR A paper copy in the mail If you have any lab test that is abnormal or we need to change your treatment, we will call you to review the results.  Testing/Procedures: Your physician has requested that you have an echocardiogram. Echocardiography is a painless test that uses sound waves to create images of your heart. It provides your doctor with information about the size and shape of your heart and how well your heart's chambers and valves are working.   You may receive an ultrasound enhancing agent through an IV if needed to better visualize your heart during the echo. This procedure takes approximately one hour.  There are no restrictions for this procedure.  This will take place at [address] [facility] [address], [place] [address]  Please note: We ask at that you not bring children with you during ultrasound (echo/ vascular) testing. Due to room size and safety concerns, children are not allowed in the ultrasound rooms during exams. Our front office staff cannot provide observation of children in our lobby area while testing is being conducted. An adult accompanying a patient to their appointment will only be allowed in the ultrasound room at the discretion of the ultrasound technician under special circumstances. We apologize for any inconvenience.  Follow-Up: At [facility], you and your health needs are our priority.  As part of our continuing mission to provide you with exceptional heart care, our providers are all part of one team.  This team includes your primary Cardiologist (physician) and Advanced Practice Providers or APPs (Physician Assistants and Nurse Practitioners) who all work together to provide you with the care you need, when you need it.  Your next appointment:   10/15/2023  Provider:   Carlos Levering, NP

## 2023-10-06 LAB — BASIC METABOLIC PANEL WITH GFR
BUN/Creatinine Ratio: 14 (ref 12–28)
BUN: 13 mg/dL (ref 8–27)
CO2: 24 mmol/L (ref 20–29)
Calcium: 9.6 mg/dL (ref 8.7–10.3)
Chloride: 103 mmol/L (ref 96–106)
Creatinine, Ser: 0.91 mg/dL (ref 0.57–1.00)
Glucose: 83 mg/dL (ref 70–99)
Potassium: 4.4 mmol/L (ref 3.5–5.2)
Sodium: 142 mmol/L (ref 134–144)
eGFR: 68 mL/min/{1.73_m2} (ref >59)

## 2023-10-11 NOTE — Progress Notes (Signed)
 Cardiology Clinic Note   Date: 10/15/2023 ID: AMIREE NO, DOB 30-Jun-1955, MRN [id number]  Primary Cardiologist:  Sammy Crisp, MD  Chief Complaint   Erica Oneill is a 68 y.o. female who presents to the clinic today for follow up after medication changes.   Patient Profile   Erica Oneill is followed by Dr. Nolan Battle for the history outlined below.      Past medical history significant for: Chest pain. Nuclear stress test 12/15/2022: No significant ischemia.  No EKG changes concerning for ischemia at peak stress or recovery.  CT attenuation correction images with no significant aortic sclerosis, unable to exclude ostial RCA disease versus aortic valve sclerosis.  Low risk scan. Chronic diastolic heart failure/pericarditis. Echo 03/04/2022: EF 55 to 60%.  No RWMA.  Normal diastolic parameters.  Normal RV size/function.  Normal PA pressure.  No significant valvular abnormalities. Palpitations/SVT. 14-day ZIO 10/26/2020: HR 46 to 128 bpm, average 78 bpm.  Predominantly sinus rhythm.  Rare PACs/PVCs.  2 atrial runs lasting up to 13.3 seconds with max HR 169 bpm.  No sustained arrhythmia or prolonged pauses.  Patient triggered events corresponded to sinus rhythm, PACs, PVCs and 1 episode of PSVT. PAF. Hypertension. Hyperlipidemia. Lipid panel 01/12/2023: LDL 163, HDL 48, TG 88, total 229.  GERD.  In summary, patient was previously followed by Dr. Revankar and transitioned care to Dr. Nolan Battle. She was admitted to the hospital in January 2020 for acute pericarditis. Echo at that time demonstrated EF 55-60%, no RWMA, small circumferential pericardial effusion without evidence of hemodynamic compromise. Zio monitor in October 2020 showed predominantly sinus rhythm with average HR 79 bpm, 39 second atrial run with max rate 169 bpm and occasional PVCs. Repeat echo November 2020 showed EF 60-65%, borderline LVH, Grade I DD, normal RV size/function, normal PA pressure and no evidence of pericardial  effusion. Echo in August 2021 was essential unchanged. Repeat Zio in May 2022 showed predominantly sinus rhythm as detailed above. Coronary CTA May 2022 showed calcium  score of 0 with evidence of CAD.   Patient was in August 2023 for an episode of elevated HR and palpitations. She was started on Bystolic  but could not tolerate it secondary to headaches. She presented to [facility] ED in September 2023 with chest pain and palpitations. Troponin negative x 3, BNP 49. She was told EKG showed new onset Afib with RVR, 151 bpm. She converted to NSR with Bystolic  and Cardizem  and was placed on Eliquis . Echo at that time showed normal LV function as detailed above. Upon discharge Bystolic  was transitioned to metoprolol .   Outpatient cardiology reviewed and felt EKG was consistent with SVT not afib.  She reported elevated heart rate whenever she ambulated as well as spontaneous tachycardia.  She did not feel that metoprolol  was helping her at BP had trended up since being transitioned from Bystolic .  Metoprolol  was increased and she was referred to EP.  She was seen by Dr. Marven Slimmer on 03/15/2022 with unclear diagnosis of PSVT felt to possibly be related to A. tach versus AVRT versus atypical AVNRT.  There is no evidence of flutter waves or A-fib.  She was transitioned to Toprol .  EP did not see clear evidence of A-fib on available EKG or telemetry strip.  Placing a loop recorder was complicated due to documented history of lidocaine causing anaphylaxis and unclear if alternative such as bupivacaine were ever received.  She was referred to an allergist to assist with local anesthesia options.  In December 2023  she followed up with EP and indicated she had not gone to the allergist and did not plan to do so.  She reported brief palpitations at that time but was unable to capture on Kardia mobile device or Apple Watch.  Still no documented evidence of A-fib.  Since she was unable to capture episodes Eliquis  was continued with  recommendation to discontinue if captured episodes did not demonstrate A-fib.  In June 2024 she noted 1 episode of tachypalpitations lasting 25 minutes.  She question if this was related to missing a dose of metoprolol  versus her updated prescription being from a different manufacturer.  She requested new metoprolol  from the pharmacy and has had no further palpitations.  She had a low risk nuclear stress test as detailed above.  Patient was seen in the office by Varney Gentleman, PA-C on 01/12/2023.  She was doing well at that time.  She had not been taking valsartan  and BP was well-controlled.   Patient was seen in the office by on 08/21/2023 for routine follow up. She reported stable dyspnea and lower extremity edema. Her BP was elevated 160/80 on intake and 150/80. She admitted to dietary indiscretion. She was instructed to monitor BP for 1 week and send in readings.   Patient contacted the office on 09/26/2023 with complaints of left leg edema. Per triage RN "Pt called and reports left leg (ankle to knee) swelling everyday since 3/31 that "mostly resolves" overnight and then returns during the day.  Pt reports some transient SOB, pt took PRN lasix  Monday and today (Wednesday).  Pt unable to quantify weight but reports 2 pounds weight loss on Monday after taking lasix  - pt is poor historian for actual weight numbers.  Pt denies any redness or warmth to left leg. Pt counseled on "same time, same scale, same situation" to collect weight in pounds every day and record that on her calendar for provider."   Patient was last seen in the office by me on 10/05/2023 for lower extremity edema. She reported it was at it's worst the previous Saturday when she sat at a baseball game all day and endorsed dietary indiscretion. She reported edema resolved with 1 dose of Lasix  but then returned. She also reported increased palpitations over the last couple of days. She was instructed to take Lasix  20 mg x 4 days then prn thereafter.  Echo was also ordered.   Patient contacted the office via MyChart on 10/09/2023 to report she did not take the 4th dose of Lasix  as she lost 6 lb and did not feel she was holding onto extra fluid. She was also concerned about increased palpitations with HR in the 90s with some lightheadedness. She was instructed to forego the 4th dose of Lasix  and take extra half dose of Toprol  for sustained palpitations or sustained HR >100.       History of Present Illness    Today, patient is accompanied by her sister. She reports lower extremity edema has been well controlled until this morning. She has been very strict with reducing sodium intake. Her weight has been overall stable within 1-2 lbs. She reports she has not had any palpitations for the last 3 days. She feels the Lasix  caused her to have increased palpitations despite trying to stay well hydrated. She denies shortness of breath, DOE, orthopnea or PND. She is starting to walk for exercise with good tolerance.     ROS: All other systems reviewed and are otherwise negative except as noted in  History of Present Illness.  EKGs/Labs Reviewed    EKG Interpretation Date/Time:  Monday October 15 2023 08:00:27 EDT Ventricular Rate:  73 PR Interval:  138 QRS Duration:  72 QT Interval:  374 QTC Calculation: 412 R Axis:   8  Text Interpretation: Normal sinus rhythm Nonspecific T wave abnormalities When compared with ECG of 05-Oct-2023 10:45, No significant change Confirmed by Morey Ar [phone]) on 10/15/2023 8:05:21 AM   01/12/2023: ALT 44; AST 35 10/05/2023: BUN 13; Creatinine, Ser 0.91; Potassium 4.4; Sodium 142   08/21/2023: Hemoglobin 14.7; WBC 9.1    Physical Exam    VS:  BP 130/84   Pulse 73   Ht 5\' 4"  (1.626 m)   Wt 213 lb 3.2 oz (96.7 kg)   SpO2 96%   BMI 36.60 kg/m  , BMI Body mass index is 36.6 kg/m.  GEN: Well nourished, well developed, in no acute distress. Neck: No JVD or carotid bruits. Cardiac:  RRR. No murmurs. No  rubs or gallops.   Respiratory:  Respirations regular and unlabored. Clear to auscultation without rales, wheezing or rhonchi. GI: Soft, nontender, nondistended. Extremities: Radials/DP/PT 2+ and equal bilaterally. No clubbing or cyanosis. Trace edema bilateral lower extremities.   Skin: Warm and dry, no rash. Neuro: Strength intact.  Assessment & Plan   Chronic diastolic heart failure/history of pericarditis/lower extremity edema  Echo September 2023 showed normal LV/RV function, normal diastolic parameters, normal PA pressure.  Patient reports lower extremity edema has been well controlled until this morning she feels her legs are a little puffier than they have been. She has been restricting sodium. Home weight has been stable. She denies shortness of breath, DOE, orthopnea or PND. Trace edema bilateral lower extremities. Euvolemic and well compensated on exam.  -Continue Toprol . -Take Lasix  20 mg every 2 days.  -Continue to weigh daily.  -BMP today.  -Echo scheduled for 5/7.   Palpitations/SVT 14-day Zio May 2022 demonstrated HR 46 to 128 bpm, average 78 bpm, predominantly sinus rhythm, rare ectopy, 2 atrial runs lasting up to 13.3 seconds with max HR 169 bpm. Patient reports recent increase in palpitations when she was taking Lasix  despite trying to stay hydrated. She has not had palpitations for the last 3 days. EKG shows NSR, 73 bpm. Discussed re-evaluating with a Zio. Since she has not had any palpitations I am concerned we will not capture anything. She is in agreement to hold off for now. She is advised to contact the office through MyChart if she develops increased palpitations again and a 14 day Zio can be ordered at that time.  -Continue Toprol .  -Continue to monitor at home. Contact the office if palpitations recur.    Hypertension BP today 130/84.  No headaches or dizziness reported. She feels her BP has been better controled since edema resolved.  -Continue  Toprol .  Disposition: BMP today. Lasix  20 mg every 2 days. Return on 5/16 after echo or sooner as needed.           Signed, Lonell Rives. Kohle Winner, DNP, NP-C

## 2023-10-15 ENCOUNTER — Ambulatory Visit: Attending: Student | Admitting: Student

## 2023-10-15 ENCOUNTER — Encounter: Payer: Self-pay | Admitting: Student

## 2023-10-15 VITALS — BP 130/84 | HR 73 | Ht 64.0 in | Wt 213.2 lb

## 2023-10-15 DIAGNOSIS — Z8679 Personal history of other diseases of the circulatory system: Secondary | ICD-10-CM | POA: Insufficient documentation

## 2023-10-15 DIAGNOSIS — R600 Localized edema: Secondary | ICD-10-CM | POA: Diagnosis not present

## 2023-10-15 DIAGNOSIS — R002 Palpitations: Secondary | ICD-10-CM | POA: Diagnosis present

## 2023-10-15 DIAGNOSIS — I5032 Chronic diastolic (congestive) heart failure: Secondary | ICD-10-CM | POA: Insufficient documentation

## 2023-10-15 DIAGNOSIS — I10 Essential (primary) hypertension: Secondary | ICD-10-CM | POA: Diagnosis present

## 2023-10-15 MED ORDER — FUROSEMIDE 20 MG PO TABS: 20.0000 mg | ORAL_TABLET | ORAL | 3 refills | Status: AC

## 2023-10-15 MED ORDER — FUROSEMIDE 20 MG PO TABS: 20.0000 mg | ORAL_TABLET | ORAL | 3 refills | Status: DC

## 2023-10-15 NOTE — Patient Instructions (Addendum)
 Medication Instructions:  Your physician recommends the following medication changes.  START TAKING: Lasix  20 mg every 3rd day   *If you need a refill on your cardiac medications before your next appointment, please call your pharmacy*  Lab Work: BMP If you have labs (blood work) drawn today and your tests are completely normal, you will receive your results only by: MyChart Message (if you have MyChart) OR A paper copy in the mail If you have any lab test that is abnormal or we need to change your treatment, we will call you to review the results.   Follow-Up: At [facility], you and your health needs are our priority.  As part of our continuing mission to provide you with exceptional heart care, our providers are all part of one team.  This team includes your primary Cardiologist (physician) and Advanced Practice Providers or APPs (Physician Assistants and Nurse Practitioners) who all work together to provide you with the care you need, when you need it.  Your next appointment:   1 month(s) 9:15 on 11/09/23  Provider:   Sammy Crisp, MD or Morey Ar, NP

## 2023-10-15 NOTE — Addendum Note (Signed)
 Addended by: Madisun Hargrove D on: 10/15/2023 09:53 AM   Modules accepted: Orders

## 2023-10-16 LAB — BASIC METABOLIC PANEL WITH GFR
BUN/Creatinine Ratio: 19 (ref 12–28)
BUN: 16 mg/dL (ref 8–27)
CO2: 20 mmol/L (ref 20–29)
Calcium: 9.6 mg/dL (ref 8.7–10.3)
Chloride: 106 mmol/L (ref 96–106)
Creatinine, Ser: 0.83 mg/dL (ref 0.57–1.00)
Glucose: 94 mg/dL (ref 70–99)
Potassium: 4.2 mmol/L (ref 3.5–5.2)
Sodium: 142 mmol/L (ref 134–144)
eGFR: 76 mL/min/{1.73_m2} (ref >59)

## 2023-10-29 NOTE — Progress Notes (Unsigned)
 Cardiology Clinic Note   Date: 10/29/2023 ID: Erica Oneill, DOB [birth year], MRN [id number]  Primary Cardiologist:  Sammy Crisp, MD  Chief Complaint   Erica Oneill is a 68 y.o. female who presents to the clinic today for ***  Patient Profile   Erica Oneill is followed by Dr. Nolan Battle for the history outlined below.      Past medical history significant for: Chest pain. Nuclear stress test 12/15/2022: No significant ischemia.  No EKG changes concerning for ischemia at peak stress or recovery.  CT attenuation correction images with no significant aortic sclerosis, unable to exclude ostial RCA disease versus aortic valve sclerosis.  Low risk scan. Chronic diastolic heart failure/pericarditis. Echo 03/04/2022: EF 55 to 60%.  No RWMA.  Normal diastolic parameters.  Normal RV size/function.  Normal PA pressure.  No significant valvular abnormalities.*** Echo 10/31/2023: EF 60 to 65%.  No RWMA.  Normal diastolic parameters.  Normal RV size/function.  Normal PA pressure, RVSP 25.8 mmHg.  No evidence of pericardial effusion.  No significant valvular abnormalities. Palpitations/SVT. 14-day ZIO 10/26/2020: HR 46 to 128 bpm, average 78 bpm.  Predominantly sinus rhythm.  Rare PACs/PVCs.  2 atrial runs lasting up to 13.3 seconds with max HR 169 bpm.  No sustained arrhythmia or prolonged pauses.  Patient triggered events corresponded to sinus rhythm, PACs, PVCs and 1 episode of PSVT. PAF. Hypertension. Hyperlipidemia. Lipid panel 01/12/2023: LDL 163, HDL 48, TG 88, total 229.  GERD.  In summary, patient was previously followed by Dr. Revankar and transitioned care to Dr. Nolan Battle. She was admitted to the hospital in January 2020 for acute pericarditis. Echo at that time demonstrated EF 55-60%, no RWMA, small circumferential pericardial effusion without evidence of hemodynamic compromise. Zio monitor in October 2020 showed predominantly sinus rhythm with average HR 79 bpm, 39 second atrial run with max  rate 169 bpm and occasional PVCs. Repeat echo November 2020 showed EF 60-65%, borderline LVH, Grade I DD, normal RV size/function, normal PA pressure and no evidence of pericardial effusion. Echo in August 2021 was essential unchanged. Repeat Zio in May 2022 showed predominantly sinus rhythm as detailed above. Coronary CTA May 2022 showed calcium  score of 0 with evidence of CAD.   Patient was in August 2023 for an episode of elevated HR and palpitations. She was started on Bystolic  but could not tolerate it secondary to headaches. She presented to [facility] ED in September 2023 with chest pain and palpitations. Troponin negative x 3, BNP 49. She was told EKG showed new onset Afib with RVR, 151 bpm. She converted to NSR with Bystolic  and Cardizem  and was placed on Eliquis . Echo at that time showed normal LV function as detailed above. Upon discharge Bystolic  was transitioned to metoprolol .   Outpatient cardiology reviewed and felt EKG was consistent with SVT not afib.  She reported elevated heart rate whenever she ambulated as well as spontaneous tachycardia.  She did not feel that metoprolol  was helping her at BP had trended up since being transitioned from Bystolic .  Metoprolol  was increased and she was referred to EP.  She was seen by Dr. Marven Slimmer on 03/15/2022 with unclear diagnosis of PSVT felt to possibly be related to A. tach versus AVRT versus atypical AVNRT.  There is no evidence of flutter waves or A-fib.  She was transitioned to Toprol .  EP did not see clear evidence of A-fib on available EKG or telemetry strip.  Placing a loop recorder was complicated due to documented history of  lidocaine causing anaphylaxis and unclear if alternative such as bupivacaine were ever received.  She was referred to an allergist to assist with local anesthesia options.  In December 2023 she followed up with EP and indicated she had not gone to the allergist and did not plan to do so.  She reported brief palpitations at that time  but was unable to capture on Kardia mobile device or Apple Watch.  Still no documented evidence of A-fib.  Since she was unable to capture episodes Eliquis  was continued with recommendation to discontinue if captured episodes did not demonstrate A-fib.  In June 2024 she noted 1 episode of tachypalpitations lasting 25 minutes.  She question if this was related to missing a dose of metoprolol  versus her updated prescription being from a different manufacturer.  She requested new metoprolol  from the pharmacy and has had no further palpitations.  She had a low risk nuclear stress test as detailed above.  Patient was seen in the office by Varney Gentleman, PA-C on 01/12/2023.  She was doing well at that time.  She had not been taking valsartan  and BP was well-controlled.   Patient was seen in the office on 08/21/2023 for routine follow up. She reported stable dyspnea and lower extremity edema. Her BP was elevated 160/80 on intake and 150/80. She admitted to dietary indiscretion. She was instructed to monitor BP for 1 week and send in readings.   Patient contacted the office on 09/26/2023 with complaints of left leg edema. Per triage RN "Pt called and reports left leg (ankle to knee) swelling everyday since 3/31 that "mostly resolves" overnight and then returns during the day.  Pt reports some transient SOB, pt took PRN lasix  Monday and today (Wednesday).  Pt unable to quantify weight but reports 2 pounds weight loss on Monday after taking lasix  - pt is poor historian for actual weight numbers.  Pt denies any redness or warmth to left leg. Pt counseled on "same time, same scale, same situation" to collect weight in pounds every day and record that on her calendar for provider."   Patient was seen in the office on 10/05/2023 for lower extremity edema. She reported it was at it's worst the previous Saturday when she sat at a baseball game all day and endorsed dietary indiscretion. She reported edema resolved with 1 dose of  Lasix  but then returned. She also reported increased palpitations over the last couple of days. She was instructed to take Lasix  20 mg x 4 days then prn thereafter. Echo was also ordered.   Patient contacted the office via MyChart on 10/09/2023 to report she did not take the 4th dose of Lasix  as she lost 6 lb and did not feel she was holding onto extra fluid. She was also concerned about increased palpitations with HR in the 90s with some lightheadedness. She was instructed to forego the 4th dose of Lasix  and take extra half dose of Toprol  for sustained palpitations or sustained HR >100.     Patient was last seen in the office by me on 10/15/2023 for follow-up after medication changes.  She has been very strict with reducing sodium intake and reported stable weight.  She felt Lasix  caused her to have increased palpitations which had resolved over the prior 3 days.  She was pending repeat echo.  Decision was made to defer outpatient monitoring for palpitations as they had resolved.  She was instructed to take Lasix  every 2 days.     History of Present  Illness    Today, patient ***  Chronic diastolic heart failure/history of pericarditis/lower extremity edema  Echo 10/31/2023 demonstrated normal LV/RV function, normal diastolic parameters, normal PA pressure, no pericardial effusion, no significant valvular abnormalities.  Patient*** -Continue Toprol , Lasix .*** -Continue to weigh daily.  -BMP today.    Palpitations/SVT 14-day Zio May 2022 demonstrated HR 46 to 128 bpm, average 78 bpm, predominantly sinus rhythm, rare ectopy, 2 atrial runs lasting up to 13.3 seconds with max HR 169 bpm. Patient reports *** -Continue Toprol .  -Continue to monitor at home.***   Hypertension BP today***.  No headaches or dizziness reported.  -Continue Toprol .  ROS: All other systems reviewed and are otherwise negative except as noted in History of Present Illness.  EKGs/Labs Reviewed        01/12/2023: ALT 44;  AST 35 10/15/2023: BUN 16; Creatinine, Ser 0.83; Potassium 4.2; Sodium 142   08/21/2023: Hemoglobin 14.7; WBC 9.1   No results found for requested labs within last 365 days.   No results found for requested labs within last 365 days.  ***  Risk Assessment/Calculations    {Does this patient have ATRIAL FIBRILLATION?:[id number]} No BP recorded.  {Refresh Note OR Click here to enter BP  :1}***        Physical Exam    VS:  There were no vitals taken for this visit. , BMI There is no height or weight on file to calculate BMI.  GEN: Well nourished, well developed, in no acute distress. Neck: No JVD or carotid bruits. Cardiac: *** RRR. No murmurs. No rubs or gallops.   Respiratory:  Respirations regular and unlabored. Clear to auscultation without rales, wheezing or rhonchi. GI: Soft, nontender, nondistended. Extremities: Radials/DP/PT 2+ and equal bilaterally. No clubbing or cyanosis. No edema ***  Skin: Warm and dry, no rash. Neuro: Strength intact.  Assessment & Plan   ***  Disposition: ***     {Are you ordering a CV Procedure (e.g. stress test, cath, DCCV, TEE, etc)?   Press F2        :[id number]}   Signed, Erica Oneill. Erica Winebarger, DNP, NP-C

## 2023-10-31 ENCOUNTER — Ambulatory Visit: Attending: Student

## 2023-10-31 DIAGNOSIS — R0609 Other forms of dyspnea: Secondary | ICD-10-CM | POA: Diagnosis present

## 2023-10-31 LAB — ECHOCARDIOGRAM COMPLETE
AV Mean grad: 4 mmHg
AV Peak grad: 7.5 mmHg
Ao pk vel: 1.37 m/s
Area-P 1/2: 3.21 cm2
S' Lateral: 2.91 cm

## 2023-11-09 ENCOUNTER — Ambulatory Visit: Attending: Student | Admitting: Student

## 2023-11-09 ENCOUNTER — Encounter: Payer: Self-pay | Admitting: Student

## 2023-11-09 VITALS — BP 130/76 | HR 71 | Resp 16 | Ht 64.0 in | Wt 208.8 lb

## 2023-11-09 DIAGNOSIS — I10 Essential (primary) hypertension: Secondary | ICD-10-CM | POA: Diagnosis present

## 2023-11-09 DIAGNOSIS — I471 Supraventricular tachycardia, unspecified: Secondary | ICD-10-CM | POA: Insufficient documentation

## 2023-11-09 DIAGNOSIS — R002 Palpitations: Secondary | ICD-10-CM | POA: Insufficient documentation

## 2023-11-09 DIAGNOSIS — Z8679 Personal history of other diseases of the circulatory system: Secondary | ICD-10-CM | POA: Diagnosis present

## 2023-11-09 DIAGNOSIS — R600 Localized edema: Secondary | ICD-10-CM | POA: Diagnosis not present

## 2023-11-09 DIAGNOSIS — I5032 Chronic diastolic (congestive) heart failure: Secondary | ICD-10-CM | POA: Insufficient documentation

## 2023-11-09 NOTE — Patient Instructions (Signed)
 Medication Instructions:  No changes at this time.   *If you need a refill on your cardiac medications before your next appointment, please call your pharmacy*  Lab Work: BMET today here in the office.   If you have labs (blood work) drawn today and your tests are completely normal, you will receive your results only by: MyChart Message (if you have MyChart) OR A paper copy in the mail If you have any lab test that is abnormal or we need to change your treatment, we will call you to review the results.  Testing/Procedures: None  Follow-Up: At [facility], you and your health needs are our priority.  As part of our continuing mission to provide you with exceptional heart care, our providers are all part of one team.  This team includes your primary Cardiologist (physician) and Advanced Practice Providers or APPs (Physician Assistants and Nurse Practitioners) who all work together to provide you with the care you need, when you need it.  Your next appointment:   6 month(s)  Provider:   Sammy Crisp, MD or Morey Ar, NP

## 2023-11-10 ENCOUNTER — Ambulatory Visit: Payer: Self-pay | Admitting: Student

## 2023-11-10 LAB — BASIC METABOLIC PANEL WITH GFR
BUN/Creatinine Ratio: 13 (ref 12–28)
BUN: 11 mg/dL (ref 8–27)
CO2: 20 mmol/L (ref 20–29)
Calcium: 9.4 mg/dL (ref 8.7–10.3)
Chloride: 103 mmol/L (ref 96–106)
Creatinine, Ser: 0.84 mg/dL (ref 0.57–1.00)
Glucose: 96 mg/dL (ref 70–99)
Potassium: 4.4 mmol/L (ref 3.5–5.2)
Sodium: 142 mmol/L (ref 134–144)
eGFR: 75 mL/min/{1.73_m2} (ref >59)

## 2023-11-22 ENCOUNTER — Ambulatory Visit: Attending: Student

## 2023-11-22 DIAGNOSIS — R002 Palpitations: Secondary | ICD-10-CM

## 2023-11-22 MED ORDER — METOPROLOL SUCCINATE ER 50 MG PO TB24: 75.0000 mg | ORAL_TABLET | Freq: Two times a day (BID) | ORAL | 3 refills | Status: DC

## 2023-12-04 ENCOUNTER — Telehealth: Payer: Self-pay | Admitting: Internal Medicine

## 2023-12-04 NOTE — Telephone Encounter (Signed)
 Called patient, advised of message below. She is currently wearing the monitor- will finish wearing it as requested and mail back for results.   Patient verbalized understanding,

## 2023-12-04 NOTE — Telephone Encounter (Signed)
 Called patient, she states that she believes the Metoprolol  is causing her palpitations to worsen. She has never noticed it before, but states once they increased the dosage recently to 75 mg BID from 50 mg BID, she states her palpitations are worse, she coughs and start wheezing after she takes the medication. So much so, so decreased back down to 50 mg BID, then still had the symptoms just not as bad, she decreased further and started taking 50 mg daily for a few days- states when she did this the other symptoms improved- she was nervous with not taking it as prescribed, so went back to taking 50 mg metoprolol  twice daily at this time- she is requesting to try maybe a different medication to see if it would help, she has had these palpitations for so long now she is getting upset and frustrated. She is currently wearing the monitor, and pushing the button on the monitor as requested for symptoms.   Advised I would route to NP to review and would call back with recommendations.

## 2023-12-04 NOTE — Telephone Encounter (Signed)
 Pt c/o medication issue:  1. Name of Medication:   metoprolol  succinate (TOPROL -XL) 50 MG 24 hr tablet    2. How are you currently taking this medication (dosage and times per day)?   Take 1.5 tablets (75 mg total) by mouth in the morning and at bedtime. Take with or immediately following a meal.    3. Are you having a reaction (difficulty breathing--STAT)?   4. What is your medication issue? Patient stated when she takes his medication it causes her to cough and start wheezing. Patient stated it increases her palpitations. Patient no longer wants to take this medication and is unsure what she should do. Please advise.

## 2023-12-23 DIAGNOSIS — R002 Palpitations: Secondary | ICD-10-CM | POA: Diagnosis not present

## 2023-12-24 ENCOUNTER — Ambulatory Visit: Payer: Self-pay | Admitting: Student

## 2023-12-25 NOTE — Progress Notes (Signed)
 Last read by Romero KATHEE Sparks at 6:20PM on 12/24/2023.

## 2023-12-31 NOTE — Progress Notes (Unsigned)
 Cardiology Clinic Note   Date: 12/31/2023 ID: Erica Oneill, DOB 22-Jan-1956, MRN [id number]  Primary Cardiologist:  Lonni Hanson, MD  Chief Complaint   Erica Oneill is a 68 y.o. female who presents to the clinic today for ***  Patient Profile   Erica Oneill is followed by Dr. Hanson for the history outlined below.      Past medical history significant for: Chest pain. Nuclear stress test 12/15/2022: No significant ischemia.  No EKG changes concerning for ischemia at peak stress or recovery.  CT attenuation correction images with no significant aortic sclerosis, unable to exclude ostial RCA disease versus aortic valve sclerosis.  Low risk scan. Chronic diastolic heart failure/pericarditis. Echo 10/31/2023: EF 60 to 65%.  No RWMA.  Normal diastolic parameters.  Normal RV size/function.  Normal PA pressure, RVSP 25.8 mmHg.  No evidence of pericardial effusion.  No significant valvular abnormalities. Palpitations/SVT. 14-day ZIO 10/26/2020: HR 46 to 128 bpm, average 78 bpm.  Predominantly sinus rhythm.  Rare PACs/PVCs.  2 atrial runs lasting up to 13.3 seconds with max HR 169 bpm.  No sustained arrhythmia or prolonged pauses.  Patient triggered events corresponded to sinus rhythm, PACs, PVCs and 1 episode of PSVT. *** 7-day ZIO 12/18/2023: 14 hours suitable for analysis secondary to artifact. HR 55-134 bpm, average 84 bpm. 11 runs of SVT lasting up to 16 beats with max rate 174 bpm. Rare ectopy. 111 patient triggered events corresponding to sinus rhythm and PVCs including episodes of ventricular bigeminy and trigeminy.  PAF. Hypertension. Hyperlipidemia. Lipid panel 01/12/2023: LDL 163, HDL 48, TG 88, total 229.  GERD.  In summary, patient was previously followed by Dr. Revankar and transitioned care to Dr. Hanson. She was admitted to the hospital in January 2020 for acute pericarditis. Echo at that time demonstrated EF 55-60%, no RWMA, small circumferential pericardial effusion without  evidence of hemodynamic compromise. Zio monitor in October 2020 showed predominantly sinus rhythm with average HR 79 bpm, 39 second atrial run with max rate 169 bpm and occasional PVCs. Repeat echo November 2020 showed EF 60-65%, borderline LVH, Grade I DD, normal RV size/function, normal PA pressure and no evidence of pericardial effusion. Echo in August 2021 was essential unchanged. Repeat Zio in May 2022 showed predominantly sinus rhythm as detailed above. Coronary CTA May 2022 showed calcium  score of 0 with evidence of CAD.   Patient was in August 2023 for an episode of elevated HR and palpitations. She was started on Bystolic  but could not tolerate it secondary to headaches. She presented to [facility] ED in September 2023 with chest pain and palpitations. Troponin negative x 3, BNP 49. She was told EKG showed new onset Afib with RVR, 151 bpm. She converted to NSR with Bystolic  and Cardizem  and was placed on Eliquis . Echo at that time showed normal LV function as detailed above. Upon discharge Bystolic  was transitioned to metoprolol .   Outpatient cardiology reviewed and felt EKG was consistent with SVT not afib.  She reported elevated heart rate whenever she ambulated as well as spontaneous tachycardia.  She did not feel that metoprolol  was helping her at BP had trended up since being transitioned from Bystolic .  Metoprolol  was increased and she was referred to EP.  She was seen by Dr. Cindie on 03/15/2022 with unclear diagnosis of PSVT felt to possibly be related to A. tach versus AVRT versus atypical AVNRT.  There is no evidence of flutter waves or A-fib.  She was transitioned to Toprol .  EP did not see clear evidence of A-fib on available EKG or telemetry strip.  Placing a loop recorder was complicated due to documented history of lidocaine causing anaphylaxis and unclear if alternative such as bupivacaine were ever received.  She was referred to an allergist to assist with local anesthesia options.  In  December 2023 she followed up with EP and indicated she had not gone to the allergist and did not plan to do so.  She reported brief palpitations at that time but was unable to capture on Kardia mobile device or Apple Watch.  Still no documented evidence of A-fib.  Since she was unable to capture episodes Eliquis  was continued with recommendation to discontinue if captured episodes did not demonstrate A-fib.  In June 2024 she noted 1 episode of tachypalpitations lasting 25 minutes.  She question if this was related to missing a dose of metoprolol  versus her updated prescription being from a different manufacturer.  She requested new metoprolol  from the pharmacy and has had no further palpitations.  She had a low risk nuclear stress test as detailed above.  Patient was seen for routine follow up in February 2025. Her BP was elevated and she was instructed to send in 1 week of BP readings. In April 2025 patient requested an office visit for lower extremity edema and palpitations. Echo demonstrated normal LV/RV function as detailed above. Upon follow up in late April she had improved her diet with reduction of sodium. She was managing edema with Lasix  as needed. She had episodes of increased palpitations but had not had any in several days and decided to defer outpatient monitoring.  In late May she contacted the office via MyChart with complaints of increased palpitations. Outpatient Zio was ordered which showed 11 runs of SVT.      History of Present Illness    Today, patient ***  Chronic diastolic heart failure/history of pericarditis/lower extremity edema  Echo 10/31/2023 demonstrated normal LV/RV function, normal diastolic parameters, normal PA pressure, no pericardial effusion, no significant valvular abnormalities.  Patient *** Euvolemic and well compensated on exam.  -Continue Toprol , Lasix . *** -Continue to weigh daily.    Palpitations/SVT 7-day Zio June 2025 HR 55-134 bpm, average 78 bpm, 11 runs  of SVT lasting up to 16 beats with max rate 174 bpm, rare ectopy, patient triggered events correspond with sinus rhythm and PVCs. Patient *** -Continue Toprol .    Hypertension BP today ***.  No headaches or dizziness reported. Home BP has improved since strict dietary sodium restriction and weight loss. *** -Continue Toprol .***  ROS: All other systems reviewed and are otherwise negative except as noted in History of Present Illness.  EKGs/Labs Reviewed        01/12/2023: ALT 44; AST 35 11/09/2023: BUN 11; Creatinine, Ser 0.84; Potassium 4.4; Sodium 142   08/21/2023: Hemoglobin 14.7; WBC 9.1   No results found for requested labs within last 365 days.   No results found for requested labs within last 365 days.  ***  Risk Assessment/Calculations    {Does this patient have ATRIAL FIBRILLATION?:[id number]} No BP recorded.  {Refresh Note OR Click here to enter BP  :1}***        Physical Exam    VS:  There were no vitals taken for this visit. , BMI There is no height or weight on file to calculate BMI.  GEN: Well nourished, well developed, in no acute distress. Neck: No JVD or carotid bruits. Cardiac: *** RRR. *** No murmur. No rubs  or gallops.   Respiratory:  Respirations regular and unlabored. Clear to auscultation without rales, wheezing or rhonchi. GI: Soft, nontender, nondistended. Extremities: Radials/DP/PT 2+ and equal bilaterally. No clubbing or cyanosis. No edema ***  Skin: Warm and dry, no rash. Neuro: Strength intact.  Assessment & Plan   ***  Disposition: ***     {Are you ordering a CV Procedure (e.g. stress test, cath, DCCV, TEE, etc)?   Press F2        :[id number]}   Signed, Barnie HERO. Khallid Pasillas, DNP, NP-C

## 2024-01-03 ENCOUNTER — Ambulatory Visit: Attending: Student | Admitting: Student

## 2024-01-03 ENCOUNTER — Encounter: Payer: Self-pay | Admitting: Student

## 2024-01-03 VITALS — BP 138/74 | HR 74 | Ht 64.0 in | Wt 196.0 lb

## 2024-01-03 DIAGNOSIS — R002 Palpitations: Secondary | ICD-10-CM | POA: Insufficient documentation

## 2024-01-03 DIAGNOSIS — I5032 Chronic diastolic (congestive) heart failure: Secondary | ICD-10-CM | POA: Diagnosis present

## 2024-01-03 DIAGNOSIS — I471 Supraventricular tachycardia, unspecified: Secondary | ICD-10-CM | POA: Diagnosis present

## 2024-01-03 DIAGNOSIS — Z8679 Personal history of other diseases of the circulatory system: Secondary | ICD-10-CM | POA: Insufficient documentation

## 2024-01-03 DIAGNOSIS — I10 Essential (primary) hypertension: Secondary | ICD-10-CM | POA: Diagnosis present

## 2024-01-03 DIAGNOSIS — R600 Localized edema: Secondary | ICD-10-CM | POA: Insufficient documentation

## 2024-01-03 MED ORDER — CARVEDILOL 3.125 MG PO TABS: 3.1250 mg | ORAL_TABLET | Freq: Two times a day (BID) | ORAL | 3 refills | Status: AC

## 2024-01-03 MED ORDER — METOPROLOL TARTRATE 25 MG PO TABS: 25.0000 mg | ORAL_TABLET | Freq: Two times a day (BID) | ORAL | 3 refills | Status: AC

## 2024-01-03 NOTE — Patient Instructions (Signed)
 Medication Instructions:   Your physician recommends the following medication changes.  STOP TAKING: Metoprolol  Succinate (TOPROL  XL) 50 mg every day   START TAKING: Carvedilol  3.125 mg by mouth twice a day Metoprolol  Tartrate 25 mg by mouth twice a day for breakthrough palpitations  Take all other medications as prescribed.  *If you need a refill on your cardiac medications before your next appointment, please call your pharmacy*  Lab Work:  No labs ordered today   If you have labs (blood work) drawn today and your tests are completely normal, you will receive your results only by: MyChart Message (if you have MyChart) OR A paper copy in the mail If you have any lab test that is abnormal or we need to change your treatment, we will call you to review the results.  Testing/Procedures:  No test ordered today   Follow-Up: At [facility], you and your health needs are our priority.  As part of our continuing mission to provide you with exceptional heart care, our providers are all part of one team.  This team includes your primary Cardiologist (physician) and Advanced Practice Providers or APPs (Physician Assistants and Nurse Practitioners) who all work together to provide you with the care you need, when you need it.  Your next appointment:    3 month(s)  Provider:    You may see Lonni Hanson, MD or one of the following Advanced Practice Providers on your designated Care Team:    Barnie Hila, NP

## 2024-04-06 NOTE — Progress Notes (Unsigned)
 Cardiology Clinic Note   Date: 04/08/2024 ID: Erica Oneill, DOB 08-16-1955, MRN [id number]  [facility] Providers Cardiologist:  Lonni Hanson, MD Electrophysiologist:  OLE ONEIDA HOLTS, MD     Chief Complaint   Erica Oneill is a 68 y.o. female who presents to the clinic today for routine follow up.   Patient Profile   Erica Oneill is followed by Dr. Hanson and Dr. HOLTS for the history outlined below.      Past medical history significant for: Chest pain. Nuclear stress test 12/15/2022: No significant ischemia.  No EKG changes concerning for ischemia at peak stress or recovery.  CT attenuation correction images with no significant aortic sclerosis, unable to exclude ostial RCA disease versus aortic valve sclerosis.  Low risk scan. Chronic diastolic heart failure/pericarditis. Echo 10/31/2023: EF 60 to 65%.  No RWMA.  Normal diastolic parameters.  Normal RV size/function.  Normal PA pressure, RVSP 25.8 mmHg.  No evidence of pericardial effusion.  No significant valvular abnormalities. Palpitations/SVT. 7-day ZIO 12/18/2023: 14 hours suitable for analysis secondary to artifact. HR 55-134 bpm, average 84 bpm. 11 runs of SVT lasting up to 16 beats with max rate 174 bpm. Rare ectopy. 111 patient triggered events corresponding to sinus rhythm and PVCs including episodes of ventricular bigeminy and trigeminy.  PAF. Hypertension. Hyperlipidemia. Lipid panel 01/12/2023: LDL 163, HDL 48, TG 88, total 229.  GERD.  In summary, patient was previously followed by Dr. Revankar and transitioned care to Dr. Hanson. She was admitted to the hospital in January 2020 for acute pericarditis. Echo at that time demonstrated EF 55-60%, no RWMA, small circumferential pericardial effusion without evidence of hemodynamic compromise. Zio monitor in October 2020 showed predominantly sinus rhythm with average HR 79 bpm, 39 second atrial run with max rate 169 bpm and occasional PVCs. Repeat echo  November 2020 showed EF 60-65%, borderline LVH, Grade I DD, normal RV size/function, normal PA pressure and no evidence of pericardial effusion. Echo in August 2021 was essential unchanged. Repeat Zio in May 2022 showed predominantly sinus rhythm as detailed above. Coronary CTA May 2022 showed calcium  score of 0 with evidence of CAD.   Patient was in August 2023 for an episode of elevated HR and palpitations. She was started on Bystolic  but could not tolerate it secondary to headaches. She presented to [facility] ED in September 2023 with chest pain and palpitations. Troponin negative x 3, BNP 49. She was told EKG showed new onset Afib with RVR, 151 bpm. She converted to NSR with Bystolic  and Cardizem  and was placed on Eliquis . Echo at that time showed normal LV function as detailed above. Upon discharge Bystolic  was transitioned to metoprolol .   Outpatient cardiology reviewed and felt EKG was consistent with SVT not afib.  She reported elevated heart rate whenever she ambulated as well as spontaneous tachycardia.  She did not feel that metoprolol  was helping her at BP had trended up since being transitioned from Bystolic .  Metoprolol  was increased and she was referred to EP.  She was seen by Dr. HOLTS on 03/15/2022 with unclear diagnosis of PSVT felt to possibly be related to A. tach versus AVRT versus atypical AVNRT.  There is no evidence of flutter waves or A-fib.  She was transitioned to Toprol .  EP did not see clear evidence of A-fib on available EKG or telemetry strip.  Placing a loop recorder was complicated due to documented history of lidocaine causing anaphylaxis and unclear if alternative such as bupivacaine was  ever received.  She was referred to an allergist to assist with local anesthesia options.  In December 2023 she followed up with EP and indicated she had not gone to the allergist and did not plan to do so.  She reported brief palpitations at that time but was unable to capture on Kardia mobile  device or Apple Watch.  Still no documented evidence of A-fib.  Since she was unable to capture episodes Eliquis  was continued with recommendation to discontinue if captured episodes did not demonstrate A-fib.  In June 2024 she noted 1 episode of tachypalpitations lasting 25 minutes.  She questioned if this was related to missing a dose of metoprolol  versus her updated prescription being from a different manufacturer.  She requested new metoprolol  from the pharmacy and has had no further palpitations.  She had a low risk nuclear stress test as detailed above.  Patient was seen for routine follow up in February 2025. Her BP was elevated and she was instructed to send in 1 week of BP readings. In April 2025 patient requested an office visit for lower extremity edema and palpitations. Echo demonstrated normal LV/RV function as detailed above. Upon follow up in late April she had improved her diet with reduction of sodium. She was managing edema with Lasix  as needed. She had episodes of increased palpitations but had not had any in several days and decided to defer outpatient monitoring.  In late May she contacted the office via MyChart with complaints of increased palpitations. Outpatient Zio was ordered which showed 11 runs of SVT.    Patient was last seen in the office by me on 01/03/2024 for follow up after testing. She was feeling much improved and reported 23 lb weight loss. She attributed increased palpitations to Toprol , as she would experience more palpitations. She decreased Toprol  to 25 mg and lost 7 lb in 2 days and palpitations decreased. BP was elevated and decision was made to stop Toprol  and start carvedilol  3.125 mg bid with metoprolol  tartrate 25 mg bid for breakthrough palpitations.      History of Present Illness    Today, patient is here alone. She is doing well. She continues to work on weight loss and is down an additional 9 lb from her last visit. She is active walking and using her  stationary bicycle daily. Patient denies shortness of breath, dyspnea on exertion, lower extremity edema, orthopnea or PND. She has not taken prn metoprolol  or Lasix  since her last visit. She was unsure if she could take Lasix  with carvedilol  so she did not take it although there were a couple of times she would have. Reassured patient that she can take Lasix  with carvedilol . No chest pain, pressure, or tightness. She reports an episode of palpitations a couple of nights ago. Her Kardia mobile strip shows sinus tachycardia with HR 130 bpm. She feels this occurred because she refilled the carvedilol  at a different pharmacy and it was a [employer]. She is going to speak to the pharmacy about changing back to the other manufacturer if possible. This has occurred before when she was on metoprolol , I am just so sensitive to medication. BP has been well controlled at home since changing to carvedilol .     ROS: All other systems reviewed and are otherwise negative except as noted in History of Present Illness.  EKGs/Labs Reviewed    EKG Interpretation Date/Time:  Tuesday April 08 2024 07:55:07 EDT Ventricular Rate:  67 PR Interval:  172 QRS Duration:  82 QT Interval:  394 QTC Calculation: 416 R Axis:   -20  Text Interpretation: Normal sinus rhythm Minimal voltage criteria for LVH, may be normal variant ( R in aVL ) Nonspecific T wave abnormalities When compared with ECG of 15-Oct-2023 08:00,  No significant change Confirmed by Loistine Sober [phone]) on 04/08/2024 8:03:42 AM   11/09/2023: BUN 11; Creatinine, Ser 0.84; Potassium 4.4; Sodium 142   08/21/2023: Hemoglobin 14.7; WBC 9.1    Physical Exam    VS:  BP 136/70   Pulse 67   Ht 5' 4 (1.626 m)   Wt 187 lb 12.8 oz (85.2 kg)   SpO2 96%   BMI 32.24 kg/m  , BMI Body mass index is 32.24 kg/m.  GEN: Well nourished, well developed, in no acute distress. Neck: No JVD or carotid bruits. Cardiac:  RRR.  No murmur. No rubs or  gallops.   Respiratory:  Respirations regular and unlabored. Clear to auscultation without rales, wheezing or rhonchi. GI: Soft, nontender, nondistended. Extremities: Radials/DP/PT 2+ and equal bilaterally. No clubbing or cyanosis. No edema.  Skin: Warm and dry, no rash. Neuro: Strength intact.  Assessment & Plan   Chronic diastolic heart failure/history of pericarditis Echo 10/31/2023 demonstrated normal LV/RV function, normal diastolic parameters, normal PA pressure, no pericardial effusion, no significant valvular abnormalities.  Patient denies DOE, orthopnea or PND. She has occasional lower extremity edema she manages with prn Lasix . No Lasix  since last visit.  Euvolemic and well compensated on exam.  - Continue Lasix  as needed and carvedilol .  - Continue to weigh daily.    Palpitations/SVT 7-day Zio June 2025 HR 55-134 bpm, average 78 bpm, 11 runs of SVT lasting up to 16 beats with max rate 174 bpm, rare ectopy, patient triggered events correspond with sinus rhythm and PVCs. Patient reports an episode of palpitations a couple of night ago. Kardia mobile strip shows sinus tachycardia HR 130 bpm. She did not need to take prn metoprolol . EKG shows NSR 67 bpm.  - Continue carvedilol  and prn metoprolol .    Hypertension BP today  136/70. BP well controlled at home. She is very active walking and using stationary bike daily.  - Continue carvedilol .  - Continue lifestyle changes with improved diet and increased physical activity.   Disposition: Return in 6 months or sooner as needed.          Signed, Sober HERO. Hlee Fringer, DNP, NP-C

## 2024-04-08 ENCOUNTER — Ambulatory Visit: Attending: Student | Admitting: Student

## 2024-04-08 ENCOUNTER — Encounter: Payer: Self-pay | Admitting: Student

## 2024-04-08 VITALS — BP 136/70 | HR 67 | Ht 64.0 in | Wt 187.8 lb

## 2024-04-08 DIAGNOSIS — R002 Palpitations: Secondary | ICD-10-CM | POA: Diagnosis not present

## 2024-04-08 DIAGNOSIS — I5032 Chronic diastolic (congestive) heart failure: Secondary | ICD-10-CM | POA: Diagnosis not present

## 2024-04-08 DIAGNOSIS — I10 Essential (primary) hypertension: Secondary | ICD-10-CM | POA: Diagnosis present

## 2024-04-08 DIAGNOSIS — Z8679 Personal history of other diseases of the circulatory system: Secondary | ICD-10-CM | POA: Insufficient documentation

## 2024-04-08 DIAGNOSIS — I471 Supraventricular tachycardia, unspecified: Secondary | ICD-10-CM | POA: Diagnosis not present

## 2024-04-08 NOTE — Patient Instructions (Signed)
 Medication Instructions:   Your physician recommends that you continue on your current medications as directed. Please refer to the Current Medication list given to you today.    *If you need a refill on your cardiac medications before your next appointment, please call your pharmacy*  Lab Work:  None ordered at this time   If you have labs (blood work) drawn today and your tests are completely normal, you will receive your results only by:  MyChart Message (if you have MyChart) OR  A paper copy in the mail If you have any lab test that is abnormal or we need to change your treatment, we will call you to review the results.  Testing/Procedures:  None ordered at this time   Referrals:  None ordered at this time   Follow-Up:  At [facility], you and your health needs are our priority.  As part of our continuing mission to provide you with exceptional heart care, our providers are all part of one team.  This team includes your primary Cardiologist (physician) and Advanced Practice Providers or APPs (Physician Assistants and Nurse Practitioners) who all work together to provide you with the care you need, when you need it.  Your next appointment:   6 month(s)  Provider:    Lonni Hanson, MD or Barnie Hila, NP    We recommend signing up for the patient portal called MyChart.  Sign up information is provided on this After Visit Summary.  MyChart is used to connect with patients for Virtual Visits (Telemedicine).  Patients are able to view lab/test results, encounter notes, upcoming appointments, etc.  Non-urgent messages can be sent to your provider as well.   To learn more about what you can do with MyChart, go to [email or web address].

## 2024-06-30 ENCOUNTER — Other Ambulatory Visit: Payer: Self-pay

## 2024-06-30 DIAGNOSIS — I4891 Unspecified atrial fibrillation: Secondary | ICD-10-CM

## 2024-07-01 MED ORDER — APIXABAN 5 MG PO TABS: 5.0000 mg | ORAL_TABLET | Freq: Two times a day (BID) | ORAL | 2 refills | Status: AC

## 2024-07-01 NOTE — Telephone Encounter (Signed)
 Prescription refill request for Eliquis  received. Indication:palps Last office visit:10/25 Scr: 0.81  7/25 Age:69 Weight:85.2  kg  Prescription refilled

## 2024-07-28 ENCOUNTER — Other Ambulatory Visit (HOSPITAL_BASED_OUTPATIENT_CLINIC_OR_DEPARTMENT_OTHER): Payer: Self-pay | Admitting: Medical

## 2024-07-28 DIAGNOSIS — M25511 Pain in right shoulder: Secondary | ICD-10-CM

## 2024-07-31 ENCOUNTER — Inpatient Hospital Stay (HOSPITAL_BASED_OUTPATIENT_CLINIC_OR_DEPARTMENT_OTHER): Admission: RE | Admit: 2024-07-31 | Discharge: 2024-07-31 | Attending: Medical | Admitting: Radiology

## 2024-07-31 DIAGNOSIS — M25511 Pain in right shoulder: Secondary | ICD-10-CM
